# Patient Record
Sex: Female | Born: 1990 | Race: Black or African American | Hispanic: No | Marital: Single
Health system: Southern US, Academic
[De-identification: ages and names within clinical notes are randomized; demographics above are authoritative.]

## PROBLEM LIST (undated history)

## (undated) ENCOUNTER — Telehealth

## (undated) ENCOUNTER — Ambulatory Visit

## (undated) ENCOUNTER — Encounter

## (undated) ENCOUNTER — Ambulatory Visit: Payer: BLUE CROSS/BLUE SHIELD

## (undated) ENCOUNTER — Ambulatory Visit: Attending: Obstetrics & Gynecology | Primary: Obstetrics & Gynecology

## (undated) ENCOUNTER — Encounter
Attending: Student in an Organized Health Care Education/Training Program | Primary: Student in an Organized Health Care Education/Training Program

## (undated) ENCOUNTER — Ambulatory Visit
Payer: Medicaid (Managed Care) | Attending: Student in an Organized Health Care Education/Training Program | Primary: Student in an Organized Health Care Education/Training Program

## (undated) ENCOUNTER — Inpatient Hospital Stay (HOSPITAL_COMMUNITY): Payer: Self-pay

## (undated) DIAGNOSIS — D573 Sickle-cell trait: Secondary | ICD-10-CM

## (undated) DIAGNOSIS — N3010 Interstitial cystitis (chronic) without hematuria: Secondary | ICD-10-CM

## (undated) DIAGNOSIS — M6289 Other specified disorders of muscle: Secondary | ICD-10-CM

## (undated) DIAGNOSIS — Z973 Presence of spectacles and contact lenses: Secondary | ICD-10-CM

## (undated) DIAGNOSIS — B20 Human immunodeficiency virus [HIV] disease: Secondary | ICD-10-CM

## (undated) DIAGNOSIS — B009 Herpesviral infection, unspecified: Secondary | ICD-10-CM

## (undated) DIAGNOSIS — D649 Anemia, unspecified: Secondary | ICD-10-CM

## (undated) DIAGNOSIS — I10 Essential (primary) hypertension: Secondary | ICD-10-CM

## (undated) DIAGNOSIS — R519 Headache, unspecified: Secondary | ICD-10-CM

## (undated) DIAGNOSIS — Z8619 Personal history of other infectious and parasitic diseases: Secondary | ICD-10-CM

## (undated) DIAGNOSIS — Z21 Asymptomatic human immunodeficiency virus [HIV] infection status: Secondary | ICD-10-CM

## (undated) HISTORY — DX: Anemia, unspecified: D64.9

## (undated) HISTORY — DX: Asymptomatic human immunodeficiency virus (hiv) infection status: Z21

## (undated) HISTORY — DX: Personal history of other infectious and parasitic diseases: Z86.19

## (undated) HISTORY — PX: DILATION AND CURETTAGE, DIAGNOSTIC / THERAPEUTIC: SUR384

## (undated) HISTORY — DX: Human immunodeficiency virus (HIV) disease: B20

---
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
## 2010-02-02 ENCOUNTER — Emergency Department (HOSPITAL_BASED_OUTPATIENT_CLINIC_OR_DEPARTMENT_OTHER): Admission: EM | Admit: 2010-02-02 | Discharge: 2010-02-02 | Payer: Self-pay | Admitting: Emergency Medicine

## 2010-04-15 ENCOUNTER — Emergency Department (HOSPITAL_COMMUNITY)
Admission: EM | Admit: 2010-04-15 | Discharge: 2010-04-15 | Payer: Self-pay | Source: Home / Self Care | Admitting: Emergency Medicine

## 2010-04-17 ENCOUNTER — Emergency Department (HOSPITAL_BASED_OUTPATIENT_CLINIC_OR_DEPARTMENT_OTHER)
Admission: EM | Admit: 2010-04-17 | Discharge: 2010-04-17 | Payer: Self-pay | Source: Home / Self Care | Admitting: Emergency Medicine

## 2010-04-21 LAB — PREGNANCY, URINE: Preg Test, Ur: POSITIVE

## 2010-04-21 LAB — URINALYSIS, ROUTINE W REFLEX MICROSCOPIC
Bilirubin Urine: NEGATIVE
Hgb urine dipstick: NEGATIVE
Ketones, ur: NEGATIVE mg/dL
Nitrite: NEGATIVE
Protein, ur: NEGATIVE mg/dL
Specific Gravity, Urine: 1.014 (ref 1.005–1.030)
Urine Glucose, Fasting: NEGATIVE mg/dL
Urobilinogen, UA: 0.2 mg/dL (ref 0.0–1.0)
pH: 5.5 (ref 5.0–8.0)

## 2010-04-21 LAB — URINE CULTURE: Colony Count: 7000

## 2010-04-21 LAB — URINE MICROSCOPIC-ADD ON

## 2010-06-23 ENCOUNTER — Emergency Department (HOSPITAL_BASED_OUTPATIENT_CLINIC_OR_DEPARTMENT_OTHER)
Admission: EM | Admit: 2010-06-23 | Discharge: 2010-06-24 | Disposition: A | Discharge status: Home / Self Care | Payer: Self-pay | Attending: Emergency Medicine | Admitting: Emergency Medicine

## 2010-06-23 DIAGNOSIS — Y939 Activity, unspecified: Secondary | ICD-10-CM | POA: Insufficient documentation

## 2010-06-23 DIAGNOSIS — IMO0002 Reserved for concepts with insufficient information to code with codable children: Secondary | ICD-10-CM | POA: Insufficient documentation

## 2010-06-23 DIAGNOSIS — X500XXA Overexertion from strenuous movement or load, initial encounter: Secondary | ICD-10-CM | POA: Insufficient documentation

## 2010-06-24 ENCOUNTER — Emergency Department (INDEPENDENT_AMBULATORY_CARE_PROVIDER_SITE_OTHER): Payer: Self-pay

## 2010-06-24 DIAGNOSIS — M25569 Pain in unspecified knee: Secondary | ICD-10-CM

## 2010-06-24 DIAGNOSIS — X500XXA Overexertion from strenuous movement or load, initial encounter: Secondary | ICD-10-CM

## 2010-06-24 DIAGNOSIS — Y9345 Activity, cheerleading: Secondary | ICD-10-CM

## 2010-06-24 DIAGNOSIS — M25469 Effusion, unspecified knee: Secondary | ICD-10-CM

## 2010-08-07 ENCOUNTER — Ambulatory Visit: Payer: Self-pay | Attending: Orthopedic Surgery | Admitting: Physical Therapy

## 2010-08-07 DIAGNOSIS — IMO0001 Reserved for inherently not codable concepts without codable children: Secondary | ICD-10-CM | POA: Insufficient documentation

## 2010-08-07 DIAGNOSIS — M25569 Pain in unspecified knee: Secondary | ICD-10-CM | POA: Insufficient documentation

## 2010-12-18 ENCOUNTER — Emergency Department (HOSPITAL_BASED_OUTPATIENT_CLINIC_OR_DEPARTMENT_OTHER)
Admission: EM | Admit: 2010-12-18 | Discharge: 2010-12-19 | Disposition: A | Discharge status: Home / Self Care | Payer: Self-pay | Attending: Emergency Medicine | Admitting: Emergency Medicine

## 2010-12-18 ENCOUNTER — Encounter: Payer: Self-pay | Admitting: *Deleted

## 2010-12-18 DIAGNOSIS — B3731 Acute candidiasis of vulva and vagina: Secondary | ICD-10-CM | POA: Insufficient documentation

## 2010-12-18 DIAGNOSIS — R300 Dysuria: Secondary | ICD-10-CM | POA: Insufficient documentation

## 2010-12-18 DIAGNOSIS — B373 Candidiasis of vulva and vagina: Secondary | ICD-10-CM

## 2010-12-18 LAB — URINE MICROSCOPIC-ADD ON

## 2010-12-18 LAB — URINALYSIS, ROUTINE W REFLEX MICROSCOPIC
Hgb urine dipstick: NEGATIVE
Nitrite: NEGATIVE
Protein, ur: NEGATIVE mg/dL
Specific Gravity, Urine: 1.022 (ref 1.005–1.030)
Urobilinogen, UA: 1 mg/dL (ref 0.0–1.0)

## 2010-12-18 LAB — PREGNANCY, URINE: Preg Test, Ur: NEGATIVE

## 2010-12-18 MED ORDER — CIPROFLOXACIN HCL 500 MG PO TABS
500.0000 mg | ORAL_TABLET | Freq: Once | ORAL | Status: AC
  Administered 2010-12-19: 500 mg via ORAL
  Filled 2010-12-18: qty 1

## 2010-12-18 MED ORDER — FLUCONAZOLE 50 MG PO TABS
150.0000 mg | ORAL_TABLET | Freq: Once | ORAL | Status: AC
  Administered 2010-12-19: 150 mg via ORAL
  Filled 2010-12-18: qty 1

## 2010-12-18 NOTE — ED Provider Notes (Signed)
History     CSN: [id number] Arrival date & time: 12/18/2010 10:26 PM  Chief Complaint  Patient presents with  . Dysuria    HPI  (Consider location/radiation/quality/duration/timing/severity/associated sxs/prior treatment)  HPI Pt presents with c/o vulvar/vaginal irritation and itching. Pt also states she has burning with urination that has worsened since she scratched the area.  Tried monistat x 1 dose 2 days ago but was not able to complete course due to discomfort.  No abdomial pain, no vaginal discharge, no vomiting or fever.  PT denies hx of recent antibiotic use, no hx of STDS.   History reviewed. No pertinent past medical history.  History reviewed. No pertinent past surgical history.  No family history on file.  History  Substance Use Topics  . Smoking status: Never Smoker   . Smokeless tobacco: Not on file  . Alcohol Use: Yes    OB History    Grav Para Term Preterm Abortions TAB SAB Ect Mult Living                  Review of Systems  Review of Systems ROS reviewed and otherwise negative except for mentioned in HPI Allergies  Review of patient's allergies indicates no known allergies.  Home Medications  No current outpatient prescriptions on file.  Physical Exam    BP 136/95  Pulse 79  Temp(Src) 97.9 F (36.6 C) (Oral)  Resp 16  Ht 5\' 4"  (1.626 m)  Wt 145 lb (65.772 kg)  BMI 24.89 kg/m2  SpO2 100%  LMP 12/04/2010 Vitals reviewed Physical Exam Physical Examination: General appearance - alert, well appearing, and in no distress Mouth - mucous membranes moist, pharynx normal without lesions Chest - clear to auscultation, no wheezes, rales or rhonchi, symmetric air entry Heart - normal rate, regular rhythm, normal S1, S2, no murmurs, rubs, clicks or gallops Abdomen - soft, nontender, nondistended, no masses or organomegaly Pelvic -external exam reveals vulvar erythema and areas of excoriation, pt refused further pelvic examination or testing Skin -  normal coloration and turgor, no rashes, no suspicious skin lesions other than vulvar findings noted above  ED Course  Procedures (including critical care time)  Labs Reviewed  URINALYSIS, ROUTINE W REFLEX MICROSCOPIC - Abnormal; Notable for the following:    Appearance CLOUDY (*)    Leukocytes, UA MODERATE (*)    All other components within normal limits  URINE MICROSCOPIC-ADD ON - Abnormal; Notable for the following:    Squamous Epithelial / LPF FEW (*)    All other components within normal limits  PREGNANCY, URINE   No results found.   No diagnosis found.   MDM Pt with external vaginal irritation and itching.  Pt declines pelvic exam, after long discussion with her about reasons for pelvic, and testing for STDs.  External exam reveals vulvar inflammation and redness with some excoriations, no vesicles.  UA shows mod WBCs, will send culture.  Will treat with diflucan for presumptive candidal vaginitis and also for UTI. Pt states she has appointment with PMD in 1 week. Given strict return precautions.  Pt agreeable with plan.         Ethelda Chick, MD 12/19/10 [phone]

## 2010-12-18 NOTE — ED Notes (Signed)
Pt c/o burning and itching on the outside of her genitalia that is worse when urinating.

## 2010-12-19 MED ORDER — CIPROFLOXACIN HCL 500 MG PO TABS: 500.0000 mg | ORAL_TABLET | Freq: Two times a day (BID) | ORAL | Status: AC

## 2011-03-31 DIAGNOSIS — Z21 Asymptomatic human immunodeficiency virus [HIV] infection status: Secondary | ICD-10-CM | POA: Insufficient documentation

## 2011-03-31 NOTE — L&D Delivery Note (Signed)
Delivery Note At 2:09 PM Weeks viable female was delivered via Vaginal, Spontaneous Delivery (Presentation: Left Occiput Anterior).  APGAR: 7, 9.   Placenta status: Intact, Spontaneous.  Cord:  with the following complications: none  Anesthesia: Epidural  Episiotomy: none Lacerations: 2nd degree;Perineal Suture Repair: 2.0 vicryl rapide Est. Blood Loss (mL): 300 ml  Mom to postpartum.  Baby to nursery-stable.  Tonya Weeks,Tonya Weeks 03/09/2012, 2:34 PM

## 2011-08-13 LAB — OB RESULTS CONSOLE ANTIBODY SCREEN: Antibody Screen: NEGATIVE

## 2011-08-13 LAB — OB RESULTS CONSOLE RUBELLA ANTIBODY, IGM: Rubella: IMMUNE

## 2011-08-31 NOTE — Progress Notes (Unsigned)
Referral from [facility] for treatment of newly diagnosed HIV Pt was contacted on Aug 17, 2011 and given intake appointment on 09-01-11 . I will check with ID physician to see if she will need office visit with physician prior to intake to start HAART.   Laurell Josephs, RN , BSN

## 2011-09-01 ENCOUNTER — Ambulatory Visit: Payer: Self-pay

## 2011-09-01 ENCOUNTER — Encounter: Payer: Self-pay | Admitting: Internal Medicine

## 2011-09-01 ENCOUNTER — Ambulatory Visit (INDEPENDENT_AMBULATORY_CARE_PROVIDER_SITE_OTHER): Payer: Self-pay | Admitting: Internal Medicine

## 2011-09-01 ENCOUNTER — Other Ambulatory Visit: Payer: Self-pay | Admitting: Internal Medicine

## 2011-09-01 ENCOUNTER — Ambulatory Visit: Payer: Self-pay | Admitting: Internal Medicine

## 2011-09-01 VITALS — BP 149/97 | HR 129 | Temp 98.1°F | Ht 63.0 in | Wt 153.0 lb

## 2011-09-01 DIAGNOSIS — B20 Human immunodeficiency virus [HIV] disease: Secondary | ICD-10-CM | POA: Insufficient documentation

## 2011-09-01 DIAGNOSIS — Z23 Encounter for immunization: Secondary | ICD-10-CM

## 2011-09-01 LAB — COMPLETE METABOLIC PANEL WITH GFR
Albumin: 4.2 g/dL (ref 3.5–5.2)
BUN: 5 mg/dL — ABNORMAL LOW (ref 6–23)
CO2: 22 mEq/L (ref 19–32)
Calcium: 9.6 mg/dL (ref 8.4–10.5)
Chloride: 102 mEq/L (ref 96–112)
GFR, Est African American: >89 mL/min
GFR, Est Non African American: >89 mL/min
Glucose, Bld: 100 mg/dL — ABNORMAL HIGH (ref 70–99)
Potassium: 3.7 mEq/L (ref 3.5–5.3)
Sodium: 134 mEq/L — ABNORMAL LOW (ref 135–145)
Total Protein: 7.8 g/dL (ref 6.0–8.3)

## 2011-09-01 LAB — CBC WITH DIFFERENTIAL/PLATELET
HCT: 31.5 % — ABNORMAL LOW (ref 36.0–46.0)
Hemoglobin: 10.4 g/dL — ABNORMAL LOW (ref 12.0–15.0)
Lymphs Abs: 2.1 10*3/uL (ref 0.7–4.0)
MCHC: 33 g/dL (ref 30.0–36.0)
Monocytes Absolute: 0.5 10*3/uL (ref 0.1–1.0)
Monocytes Relative: 9 % (ref 3–12)
Neutro Abs: 3.5 10*3/uL (ref 1.7–7.7)
Neutrophils Relative %: 56 % (ref 43–77)
RBC: 4.72 MIL/uL (ref 3.87–5.11)

## 2011-09-01 LAB — HEPATITIS B SURFACE ANTIGEN: Hepatitis B Surface Ag: NEGATIVE

## 2011-09-01 MED ORDER — EMTRICITABINE-TENOFOVIR DF 200-300 MG PO TABS: 1.0000 | ORAL_TABLET | Freq: Every day | ORAL | Status: DC

## 2011-09-01 MED ORDER — ATAZANAVIR SULFATE 300 MG PO CAPS: 300.0000 mg | ORAL_CAPSULE | Freq: Every day | ORAL | Status: DC

## 2011-09-01 MED ORDER — RITONAVIR 100 MG PO TABS: 100.0000 mg | ORAL_TABLET | Freq: Every day | ORAL | Status: DC

## 2011-09-01 NOTE — Patient Instructions (Signed)
Blood test in 1 month

## 2011-09-01 NOTE — Progress Notes (Signed)
  Subjective:    Patient ID: Tonya Weeks, female    DOB: 1991-03-28, 21 y.o.   MRN: [id number]  HPI herre as a new patient.  Seen by OB for pregnancy and found out to be HIV +.  Was shocked and does not remember being tested previously.  Does not know when or how.  Denies drug use.  Thinks she is in the first trimester of pregnancy.  Viral load 41,000 at outside lab.  CD4 has not been done.  Hep C negative.  Toxo negative.  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  No weight loss or other symptoms.  Here with her mom who is aware of the diagnosis.  Feels she has good social support with her mom.     Review of Systems  Constitutional: Negative for fever, chills, fatigue and unexpected weight change.  HENT: Negative for sore throat and trouble swallowing.   Respiratory: Negative for cough, chest tightness and shortness of breath.   Cardiovascular: Negative for chest pain, palpitations and leg swelling.  Gastrointestinal: Negative for nausea, abdominal pain, diarrhea and constipation.  Musculoskeletal: Negative for myalgias, joint swelling and arthralgias.  Skin: Negative for rash.  Neurological: Negative for dizziness and headaches.  Hematological: Negative for adenopathy.  Psychiatric/Behavioral: Negative for dysphoric mood. The patient is not nervous/anxious.        Objective:   Physical Exam  Constitutional: She appears well-developed and well-nourished. No distress.  HENT:  Mouth/Throat: Oropharynx is clear and moist. No oropharyngeal exudate.  Cardiovascular: Normal rate, regular rhythm and normal heart sounds.  Exam reveals no gallop and no friction rub.   No murmur heard. Pulmonary/Chest: Effort normal and breath sounds normal. No respiratory distress. She has no wheezes. She has no rales.  Abdominal: Soft. Bowel sounds are normal. She exhibits no distension. There is no tenderness. There is no rebound.  Lymphadenopathy:    She has no cervical adenopathy.  Skin: Skin is warm and dry.  No erythema.  Psychiatric:       Tearful initially but accepting          Assessment & Plan:

## 2011-09-01 NOTE — Assessment & Plan Note (Addendum)
I discussed with her at length the good prognosis with treatment and the different treatment options with pregnancy.  I will start her on boosted Reyataz with Truvada.  She will RTC in 1 month for labs.  She will get genotype today and confirmation along with hepatitis labs.  Pneumovax today.

## 2011-09-02 LAB — T-HELPER CELL (CD4) - (RCID CLINIC ONLY): CD4 % Helper T Cell: 15 % — ABNORMAL LOW (ref 33–55)

## 2011-09-02 LAB — HEPATITIS A ANTIBODY, TOTAL: Hep A Total Ab: POSITIVE — AB

## 2011-09-03 LAB — HIV-1 RNA ULTRAQUANT REFLEX TO GENTYP+
HIV 1 RNA Quant: 54283 copies/mL — ABNORMAL HIGH (ref <20)
HIV-1 RNA Quant, Log: 4.73 {Log} — ABNORMAL HIGH (ref <1.30)

## 2011-09-07 ENCOUNTER — Telehealth: Payer: Self-pay | Admitting: *Deleted

## 2011-09-07 NOTE — Telephone Encounter (Signed)
Patient called and reported that she is vomiting daily, not when she takes the medication but with the next meal. She reports that she is taking the meds with a meal and that this is new. She has only been taking the medication for about 7 days. She is not sure if it is the medication or if it is her pregnancy. She also reports no pain or diarrhea. Advised patient I will have to message the provider and give her a call back with his advise.

## 2011-09-08 ENCOUNTER — Other Ambulatory Visit: Payer: Self-pay | Admitting: *Deleted

## 2011-09-08 DIAGNOSIS — R112 Nausea with vomiting, unspecified: Secondary | ICD-10-CM

## 2011-09-08 MED ORDER — PROMETHAZINE HCL 12.5 MG PO TABS: 12.5000 mg | ORAL_TABLET | Freq: Four times a day (QID) | ORAL | Status: DC | PRN

## 2011-09-08 NOTE — Telephone Encounter (Signed)
I suspect more related to pregnancy since it is her first trimester.  She can try 12.5 mg of phenergan every 4 hours PRN.

## 2011-09-08 NOTE — Telephone Encounter (Signed)
Called patient back and advised of the provisders advise and she wants to try the promethazine. Sent the Rx to the pharmacy.

## 2011-09-10 LAB — HIV-1 GENOTYPR PLUS

## 2011-09-17 ENCOUNTER — Encounter (HOSPITAL_COMMUNITY): Payer: Self-pay | Admitting: Emergency Medicine

## 2011-09-17 ENCOUNTER — Emergency Department (HOSPITAL_COMMUNITY)
Admission: EM | Admit: 2011-09-17 | Discharge: 2011-09-18 | Disposition: A | Discharge status: Home / Self Care | Payer: Medicaid Other | Attending: Emergency Medicine | Admitting: Emergency Medicine

## 2011-09-17 DIAGNOSIS — R51 Headache: Secondary | ICD-10-CM | POA: Insufficient documentation

## 2011-09-17 DIAGNOSIS — Z21 Asymptomatic human immunodeficiency virus [HIV] infection status: Secondary | ICD-10-CM | POA: Insufficient documentation

## 2011-09-17 DIAGNOSIS — O210 Mild hyperemesis gravidarum: Secondary | ICD-10-CM | POA: Insufficient documentation

## 2011-09-17 DIAGNOSIS — Z79899 Other long term (current) drug therapy: Secondary | ICD-10-CM | POA: Insufficient documentation

## 2011-09-17 DIAGNOSIS — R1110 Vomiting, unspecified: Secondary | ICD-10-CM

## 2011-09-17 HISTORY — DX: Human immunodeficiency virus (HIV) disease: B20

## 2011-09-17 LAB — CBC
HCT: 33.4 % — ABNORMAL LOW (ref 36.0–46.0)
MCHC: 33.2 g/dL (ref 30.0–36.0)
Platelets: 296 10*3/uL (ref 150–400)
RDW: 20.6 % — ABNORMAL HIGH (ref 11.5–15.5)

## 2011-09-17 LAB — POCT I-STAT, CHEM 8
Chloride: 104 mEq/L (ref 96–112)
Creatinine, Ser: 0.6 mg/dL (ref 0.50–1.10)
Glucose, Bld: 74 mg/dL (ref 70–99)
HCT: 36 % (ref 36.0–46.0)
Potassium: 3.9 mEq/L (ref 3.5–5.1)

## 2011-09-17 LAB — URINALYSIS, ROUTINE W REFLEX MICROSCOPIC
Glucose, UA: NEGATIVE mg/dL
Protein, ur: NEGATIVE mg/dL
Urobilinogen, UA: 0.2 mg/dL (ref 0.0–1.0)

## 2011-09-17 LAB — DIFFERENTIAL
Basophils Absolute: 0 10*3/uL (ref 0.0–0.1)
Eosinophils Absolute: 0.1 10*3/uL (ref 0.0–0.7)
Lymphocytes Relative: 29 % (ref 12–46)
Lymphs Abs: 2.3 10*3/uL (ref 0.7–4.0)
Neutro Abs: 5.2 10*3/uL (ref 1.7–7.7)

## 2011-09-17 LAB — URINE MICROSCOPIC-ADD ON

## 2011-09-17 MED ORDER — SODIUM CHLORIDE 0.9 % IV BOLUS (SEPSIS)
1000.0000 mL | Freq: Once | INTRAVENOUS | Status: AC
  Administered 2011-09-17: 1000 mL via INTRAVENOUS

## 2011-09-17 MED ORDER — ONDANSETRON HCL 4 MG/2ML IJ SOLN
4.0000 mg | Freq: Once | INTRAMUSCULAR | Status: AC
  Administered 2011-09-17: 4 mg via INTRAVENOUS
  Filled 2011-09-17: qty 2

## 2011-09-17 NOTE — ED Notes (Signed)
Pt states she has been vomiting for a day and a half  Pt states she has nausea, headache, and has been feeling dizzy  Pt states she has been taking phenergan po for the nausea without relief

## 2011-09-17 NOTE — ED Provider Notes (Signed)
History     CSN: [id number]  Arrival date & time 09/17/11  1916   First MD Initiated Contact with Patient 09/17/11 2146      Chief Complaint  Patient presents with  . Nausea  . Emesis  . Headache    (Consider location/radiation/quality/duration/timing/severity/associated sxs/prior treatment) HPI Comments: Patient is [redacted] weeks pregnant.  She has had nausea and vomiting with loose stools for the past day and half.  She is taking Phenergan without relief from her symptoms.  She reports just before she vomits.  She has a feeling of discomfort in her upper abdomen.  Denies vaginal bleeding discharge, UTI symptoms.  She thinks she may feel this slight fluttering in her abdomen but as this is her first pregnancy is unsure if this is fetal movement.  She is followed [place] by her ID doctor, as well as her OB/GYN.  She, states she has not missed any of her HIV medicines  Patient is a 21 y.o. female presenting with vomiting and headaches. The history is provided by the patient.  Emesis  This is a new problem. The current episode started yesterday. The problem occurs 5 to 10 times per day. The problem has not changed since onset.The emesis has an appearance of bilious material. There has been no fever. Associated symptoms include abdominal pain, diarrhea and headaches. Pertinent negatives include no chills, no cough, no fever, no myalgias and no URI.  Headache  Associated symptoms include nausea and vomiting. Pertinent negatives include no fever.    Past Medical History  Diagnosis Date  . HIV infection   . HIV (human immunodeficiency virus infection)     History reviewed. No pertinent past surgical history.  Family History  Problem Relation Age of Onset  . Lupus Mother   . Hypertension Other   . Diabetes Other   . Stroke Other   . Coronary artery disease Other     History  Substance Use Topics  . Smoking status: Never Smoker   . Smokeless tobacco: Never Used  . Alcohol Use: No     OB History    Grav Para Term Preterm Abortions TAB SAB Ect Mult Living   2    1           Review of Systems  Constitutional: Negative for fever and chills.  HENT: Negative for rhinorrhea.   Respiratory: Negative for cough.   Gastrointestinal: Positive for nausea, vomiting, abdominal pain and diarrhea.  Genitourinary: Negative for dysuria, frequency, vaginal bleeding, vaginal discharge and vaginal pain.  Musculoskeletal: Negative for myalgias.  Neurological: Positive for headaches. Negative for dizziness and weakness.    Allergies  Review of patient's allergies indicates no known allergies.  Home Medications   Current Outpatient Rx  Name Route Sig Dispense Refill  . ATAZANAVIR SULFATE 300 MG PO CAPS Oral Take 1 capsule (300 mg total) by mouth daily with breakfast. 30 capsule 11  . EMTRICITABINE-TENOFOVIR 200-300 MG PO TABS Oral Take 1 tablet by mouth daily. 30 tablet 11  . HYDROCORTISONE 1 % EX CREA Topical Apply 1 application topically as needed. For itch    . PRENATAL MULTIVITAMIN CH Oral Take 1 tablet by mouth daily.    Marland Kitchen RITONAVIR 100 MG PO TABS Oral Take 1 tablet (100 mg total) by mouth daily with breakfast. 30 tablet 11  . ONDANSETRON HCL 4 MG PO TABS Oral Take 1 tablet (4 mg total) by mouth every 6 (six) hours. 12 tablet 0    BP 107/62  Pulse  91  Temp 98.2 F (36.8 C) (Oral)  Resp 20  SpO2 100%  LMP 06/08/2011  Physical Exam  Constitutional: She is oriented to person, place, and time. She appears well-developed and well-nourished.  HENT:  Head: Normocephalic.  Mouth/Throat: Uvula is midline.  Cardiovascular: Normal rate.   Pulmonary/Chest: Effort normal.  Abdominal: She exhibits no distension. There is no tenderness.  Musculoskeletal: She exhibits no edema and no tenderness.  Neurological: She is alert and oriented to person, place, and time.  Skin: Skin is warm. No rash noted.    ED Course  Procedures (including critical care time)  Labs Reviewed   URINALYSIS, ROUTINE W REFLEX MICROSCOPIC - Abnormal; Notable for the following:    APPearance CLOUDY (*)     Ketones, ur TRACE (*)     Leukocytes, UA SMALL (*)     All other components within normal limits  CBC - Abnormal; Notable for the following:    Hemoglobin 11.1 (*)     HCT 33.4 (*)     MCV 69.0 (*)     MCH 22.9 (*)     RDW 20.6 (*)     All other components within normal limits  POCT I-STAT, CHEM 8 - Abnormal; Notable for the following:    BUN <3 (*)     All other components within normal limits  URINE MICROSCOPIC-ADD ON - Abnormal; Notable for the following:    Squamous Epithelial / LPF FEW (*)     Bacteria, UA MANY (*)     Casts HYALINE CASTS (*)  GRANULAR CAST   All other components within normal limits  DIFFERENTIAL   No results found.   1. Vomiting     Tolerating by mouth fluids, when necessary greater than 160.  Headache is resolved with by mouth Tylenol  MDM   Will hydrate, obtain cbc I-stat and urine        Arman Filter, NP 09/18/11 1610  Arman Filter, NP 09/18/11 [phone]

## 2011-09-18 MED ORDER — ONDANSETRON HCL 4 MG PO TABS: 4.0000 mg | ORAL_TABLET | Freq: Four times a day (QID) | ORAL | Status: AC

## 2011-09-18 MED ORDER — ACETAMINOPHEN 325 MG PO TABS
650.0000 mg | ORAL_TABLET | Freq: Once | ORAL | Status: AC
  Administered 2011-09-18: 650 mg via ORAL
  Filled 2011-09-18: qty 2

## 2011-09-18 NOTE — ED Notes (Signed)
Tolerating fluids, ready for discharge 

## 2011-09-19 NOTE — ED Provider Notes (Signed)
Medical screening examination/treatment/procedure(s) were performed by non-physician practitioner and as supervising physician I was immediately available for consultation/collaboration.  Flint Melter, MD 09/19/11 [phone]

## 2011-10-06 ENCOUNTER — Other Ambulatory Visit: Payer: Medicaid Other

## 2011-10-06 DIAGNOSIS — B20 Human immunodeficiency virus [HIV] disease: Secondary | ICD-10-CM

## 2011-10-06 LAB — COMPLETE METABOLIC PANEL WITH GFR
Alkaline Phosphatase: 72 U/L (ref 39–117)
BUN: 6 mg/dL (ref 6–23)
Creat: 0.48 mg/dL — ABNORMAL LOW (ref 0.50–1.10)
GFR, Est Non African American: >89 mL/min
Glucose, Bld: 110 mg/dL — ABNORMAL HIGH (ref 70–99)
Sodium: 131 mEq/L — ABNORMAL LOW (ref 135–145)
Total Bilirubin: 1.1 mg/dL (ref 0.3–1.2)
Total Protein: 7.2 g/dL (ref 6.0–8.3)

## 2011-10-06 LAB — CBC WITH DIFFERENTIAL/PLATELET
Basophils Relative: 0 % (ref 0–1)
Eosinophils Absolute: 0 10*3/uL (ref 0.0–0.7)
Eosinophils Relative: 0 % (ref 0–5)
Hemoglobin: 9.5 g/dL — ABNORMAL LOW (ref 12.0–15.0)
MCH: 23.2 pg — ABNORMAL LOW (ref 26.0–34.0)
MCHC: 33.6 g/dL (ref 30.0–36.0)
Monocytes Relative: 7 % (ref 3–12)
Neutrophils Relative %: 69 % (ref 43–77)

## 2011-10-07 LAB — T-HELPER CELL (CD4) - (RCID CLINIC ONLY)
CD4 % Helper T Cell: 20 % — ABNORMAL LOW (ref 33–55)
CD4 T Cell Abs: 290 uL — ABNORMAL LOW (ref 400–2700)

## 2011-10-08 LAB — HIV-1 RNA QUANT-NO REFLEX-BLD: HIV 1 RNA Quant: 210 copies/mL — ABNORMAL HIGH (ref <20)

## 2011-10-20 ENCOUNTER — Ambulatory Visit: Payer: Self-pay | Admitting: Internal Medicine

## 2011-11-03 ENCOUNTER — Encounter: Payer: Self-pay | Admitting: Internal Medicine

## 2011-11-03 ENCOUNTER — Ambulatory Visit (INDEPENDENT_AMBULATORY_CARE_PROVIDER_SITE_OTHER): Payer: Medicaid Other | Admitting: Internal Medicine

## 2011-11-03 VITALS — BP 131/88 | HR 108 | Temp 98.2°F | Ht 63.0 in | Wt 160.0 lb

## 2011-11-03 DIAGNOSIS — B20 Human immunodeficiency virus [HIV] disease: Secondary | ICD-10-CM

## 2011-11-03 NOTE — Assessment & Plan Note (Signed)
She is having an appropriate response with her viral load. I will check her viral load again today since it has been one month. She will return in 2 months with labs 2 weeks before. That will put her at about 7-7-1/2 months of pregnancy. All questions were again answered. The patient is continuing to adjust and I did discuss that she needs to continue taking her medications even when pregnant and during delivery and that will be provided at the hospital.

## 2011-11-03 NOTE — Progress Notes (Signed)
  Subjective:    Patient ID: Tonya Weeks, female    DOB: Feb 21, 1991, 21 y.o.   MRN: [id number]  HPI She comes in here for her second visit. She was seen in June of this year and found to be HIV positive during her prenatal visit. She has an initial viral load of 41,000 and her C4 over 200. She did visit with her mom at that appointment. She was then started on Norvir, Reyataz and Truvada. She reports excellent compliance which is evidenced by her viral load which is down to 210. She did have some initial nausea and vomiting that has since resolved. She is tolerating the medicine well and is pleased with the lack of side effects.   Review of Systems  Constitutional: Negative for fever, chills, appetite change, fatigue and unexpected weight change.  HENT: Negative for sore throat and trouble swallowing.   Respiratory: Negative for cough, shortness of breath and wheezing.   Cardiovascular: Negative for chest pain, palpitations and leg swelling.  Gastrointestinal: Negative for nausea, abdominal pain, diarrhea and constipation.  Musculoskeletal: Negative for myalgias, joint swelling and arthralgias.  Skin: Negative for rash.  Neurological: Negative for dizziness and headaches.       Objective:   Physical Exam  Constitutional: She appears well-developed and well-nourished. No distress.  HENT:  Mouth/Throat: Oropharynx is clear and moist. No oropharyngeal exudate.  Cardiovascular: Normal rate, regular rhythm and normal heart sounds.  Exam reveals no gallop and no friction rub.   No murmur heard. Pulmonary/Chest: Effort normal and breath sounds normal. No respiratory distress. She has no wheezes.  Abdominal:       gravid  Lymphadenopathy:    She has no cervical adenopathy.          Assessment & Plan:

## 2011-11-04 LAB — HIV-1 RNA QUANT-NO REFLEX-BLD
HIV 1 RNA Quant: 120 copies/mL — ABNORMAL HIGH (ref <20)
HIV-1 RNA Quant, Log: 2.08 {Log} — ABNORMAL HIGH (ref <1.30)

## 2011-11-16 ENCOUNTER — Inpatient Hospital Stay (HOSPITAL_COMMUNITY)
Admission: AD | Admit: 2011-11-16 | Discharge: 2011-11-16 | Disposition: A | Discharge status: Home / Self Care | Payer: Medicaid Other | Source: Ambulatory Visit | Attending: Obstetrics | Admitting: Obstetrics

## 2011-11-16 ENCOUNTER — Other Ambulatory Visit: Payer: Self-pay

## 2011-11-16 ENCOUNTER — Encounter (HOSPITAL_COMMUNITY): Payer: Self-pay

## 2011-11-16 DIAGNOSIS — O99019 Anemia complicating pregnancy, unspecified trimester: Secondary | ICD-10-CM | POA: Insufficient documentation

## 2011-11-16 DIAGNOSIS — R0602 Shortness of breath: Secondary | ICD-10-CM

## 2011-11-16 DIAGNOSIS — O98519 Other viral diseases complicating pregnancy, unspecified trimester: Secondary | ICD-10-CM | POA: Insufficient documentation

## 2011-11-16 DIAGNOSIS — Z21 Asymptomatic human immunodeficiency virus [HIV] infection status: Secondary | ICD-10-CM | POA: Insufficient documentation

## 2011-11-16 DIAGNOSIS — R000 Tachycardia, unspecified: Secondary | ICD-10-CM | POA: Insufficient documentation

## 2011-11-16 DIAGNOSIS — D649 Anemia, unspecified: Secondary | ICD-10-CM

## 2011-11-16 HISTORY — DX: Sickle-cell trait: D57.3

## 2011-11-16 LAB — COMPREHENSIVE METABOLIC PANEL
Albumin: 3.2 g/dL — ABNORMAL LOW (ref 3.5–5.2)
BUN: 5 mg/dL — ABNORMAL LOW (ref 6–23)
Chloride: 99 mEq/L (ref 96–112)
Creatinine, Ser: 0.51 mg/dL (ref 0.50–1.10)
GFR calc Af Amer: >90 mL/min (ref >90)
Glucose, Bld: 73 mg/dL (ref 70–99)
Total Bilirubin: 0.9 mg/dL (ref 0.3–1.2)
Total Protein: 7.5 g/dL (ref 6.0–8.3)

## 2011-11-16 LAB — CBC
HCT: 26.8 % — ABNORMAL LOW (ref 36.0–46.0)
MCH: 23.4 pg — ABNORMAL LOW (ref 26.0–34.0)
MCV: 72 fL — ABNORMAL LOW (ref 78.0–100.0)
Platelets: 241 10*3/uL (ref 150–400)
RDW: 16.3 % — ABNORMAL HIGH (ref 11.5–15.5)

## 2011-11-16 LAB — URINALYSIS, ROUTINE W REFLEX MICROSCOPIC
Bilirubin Urine: NEGATIVE
Hgb urine dipstick: NEGATIVE
Ketones, ur: NEGATIVE mg/dL
Nitrite: NEGATIVE
Urobilinogen, UA: 0.2 mg/dL (ref 0.0–1.0)

## 2011-11-16 MED ORDER — INTEGRA PLUS PO CAPS
1.0000 | ORAL_CAPSULE | ORAL | Status: DC
Start: 2011-11-16 — End: 2012-04-04

## 2011-11-16 MED ORDER — LACTATED RINGERS IV BOLUS (SEPSIS)
1000.0000 mL | Freq: Once | INTRAVENOUS | Status: AC
  Administered 2011-11-16: 1000 mL via INTRAVENOUS

## 2011-11-16 NOTE — MAU Note (Signed)
Pt in c/o shortness of breath x1 week, states it was worse today than it has been.  Was previously just sob with exertion, today happened without doing anything.  Denies any bleeding, discharge, pain.  + FM.

## 2011-11-16 NOTE — MAU Provider Note (Signed)
History     CSN: [id number]  Arrival date and time: 11/16/11 1714   First Provider Initiated Contact with Patient 11/16/11 1821      Chief Complaint  Patient presents with  . Shortness of Breath   HPI  Pt is [redacted]w[redacted]d pregnant pt of Dr. Jean Rosenthal Moore's who presents with SOB for about 1 week- it has worsened today without exertion.  Pt is on triple antiviral meds - diagnosed with HIV this pregnancy. Pt denies cough, wheezes, fever, chills, any UTI symtpoms.    Past Medical History  Diagnosis Date  . HIV infection   . HIV (human immunodeficiency virus infection)   . Sickle cell trait     Past Surgical History  Procedure Date  . Dilation and curettage, diagnostic / therapeutic     Family History  Problem Relation Age of Onset  . Lupus Mother   . Hypertension Other   . Diabetes Other   . Stroke Other   . Coronary artery disease Other   . Other Neg Hx     History  Substance Use Topics  . Smoking status: Never Smoker   . Smokeless tobacco: Never Used  . Alcohol Use: No    Allergies: No Known Allergies  Prescriptions prior to admission  Medication Sig Dispense Refill  . atazanavir (REYATAZ) 300 MG capsule Take 1 capsule (300 mg total) by mouth daily with breakfast.  30 capsule  11  . emtricitabine-tenofovir (TRUVADA) 200-300 MG per tablet Take 1 tablet by mouth daily.  30 tablet  11  . hydrocortisone cream 1 % Apply 1 application topically as needed. For itch      . Prenatal Vit-Fe Fumarate-FA (PRENATAL MULTIVITAMIN) TABS Take 1 tablet by mouth daily.      . ritonavir (NORVIR) 100 MG TABS Take 1 tablet (100 mg total) by mouth daily with breakfast.  30 tablet  11    Review of Systems  Constitutional: Negative for fever and chills.  Respiratory: Positive for shortness of breath. Negative for cough and wheezing.   Gastrointestinal: Negative for nausea, vomiting, abdominal pain, diarrhea and constipation.  Genitourinary: Negative for dysuria, urgency and frequency.    Musculoskeletal: Negative for myalgias.  Psychiatric/Behavioral: Negative for depression.   Physical Exam   Blood pressure 127/84, pulse 105, temperature 98 F (36.7 C), temperature source Oral, resp. rate 18, height 5' 2.5" (1.588 m), weight 74.118 kg (163 lb 6.4 oz), last menstrual period 06/08/2011, SpO2 100.00%.  Physical Exam  Vitals reviewed. Constitutional: She is oriented to person, place, and time. She appears well-developed and well-nourished. No distress.  HENT:  Head: Normocephalic.  Eyes: Pupils are equal, round, and reactive to light.  Neck: Normal range of motion.  Cardiovascular: Normal heart sounds.        tachycardia  Respiratory: Effort normal and breath sounds normal. No respiratory distress. She has no wheezes. She has no rales.  GI: Soft. She exhibits no distension. There is no tenderness.  Musculoskeletal: Normal range of motion.  Neurological: She is alert and oriented to person, place, and time.  Skin: Skin is warm and dry. She is not diaphoretic.  Psychiatric: She has a normal mood and affect.    MAU Course  Procedures Results for orders placed during the hospital encounter of 11/16/11 (from the past 24 hour(s))  URINALYSIS, ROUTINE W REFLEX MICROSCOPIC     Status: Abnormal   Collection Time   11/16/11  6:00 PM      Component Value Range   Color, Urine  YELLOW  YELLOW   APPearance HAZY (*) CLEAR   Specific Gravity, Urine 1.025  1.005 - 1.030   pH 6.0  5.0 - 8.0   Glucose, UA 500 (*) NEGATIVE mg/dL   Hgb urine dipstick NEGATIVE  NEGATIVE   Bilirubin Urine NEGATIVE  NEGATIVE   Ketones, ur NEGATIVE  NEGATIVE mg/dL   Protein, ur NEGATIVE  NEGATIVE mg/dL   Urobilinogen, UA 0.2  0.0 - 1.0 mg/dL   Nitrite NEGATIVE  NEGATIVE   Leukocytes, UA NEGATIVE  NEGATIVE  CBC     Status: Abnormal   Collection Time   11/16/11  6:20 PM      Component Value Range   WBC 10.0  4.0 - 10.5 K/uL   RBC 3.72 (*) 3.87 - 5.11 MIL/uL   Hemoglobin 8.7 (*) 12.0 - 15.0 g/dL    HCT 86.5 (*) 78.4 - 46.0 %   MCV 72.0 (*) 78.0 - 100.0 fL   MCH 23.4 (*) 26.0 - 34.0 pg   MCHC 32.5  30.0 - 36.0 g/dL   RDW 69.6 (*) 29.5 - 28.4 %   Platelets 241  150 - 400 K/uL  COMPREHENSIVE METABOLIC PANEL     Status: Abnormal   Collection Time   11/16/11  6:20 PM      Component Value Range   Sodium 134 (*) 135 - 145 mEq/L   Potassium 3.8  3.5 - 5.1 mEq/L   Chloride 99  96 - 112 mEq/L   CO2 23  19 - 32 mEq/L   Glucose, Bld 73  70 - 99 mg/dL   BUN 5 (*) 6 - 23 mg/dL   Creatinine, Ser 1.32  0.50 - 1.10 mg/dL   Calcium 9.6  8.4 - 44.0 mg/dL   Total Protein 7.5  6.0 - 8.3 g/dL   Albumin 3.2 (*) 3.5 - 5.2 g/dL   AST 17  0 - 37 U/L   ALT 11  0 - 35 U/L   Alkaline Phosphatase 102  39 - 117 U/L   Total Bilirubin 0.9  0.3 - 1.2 mg/dL   GFR calc non Af Amer >90  >90 mL/min   GFR calc Af Amer >90  >90 mL/min  .EKG performed with sinus tachycardia possible left atrial enlargement and septal infarct, age undetermined-  LR 1 liter given to pt with some improvement of symptoms  Discussed with Dr. Gaynell Face- pt to f/u with Dr. Tamela Oddi  Assessment and Plan  Anemia- prescription for Integra given Shortness of breath tachycardia HIV Cadince Hilscher 11/16/2011, 6:42 PM

## 2011-11-16 NOTE — MAU Note (Signed)
Patient states she has been having shortness with exertion for about one week. For the past few days she has been having it more often while not doing anything and just sitting. Today was worse. Report good fetal movement, no bleeding,leaking or contractions.

## 2011-11-23 LAB — OB RESULTS CONSOLE RPR: RPR: REACTIVE

## 2011-12-01 ENCOUNTER — Other Ambulatory Visit: Payer: Self-pay | Admitting: Obstetrics

## 2011-12-03 ENCOUNTER — Inpatient Hospital Stay (HOSPITAL_COMMUNITY)
Admission: AD | Admit: 2011-12-03 | Discharge: 2011-12-03 | Disposition: A | Discharge status: Home / Self Care | Payer: Self-pay | Source: Ambulatory Visit | Attending: Obstetrics | Admitting: Obstetrics

## 2011-12-03 ENCOUNTER — Inpatient Hospital Stay (HOSPITAL_COMMUNITY)
Admission: AD | Admit: 2011-12-03 | Discharge: 2011-12-04 | Disposition: A | Discharge status: Home / Self Care | Payer: Medicaid Other | Source: Ambulatory Visit | Attending: Obstetrics & Gynecology | Admitting: Obstetrics & Gynecology

## 2011-12-03 ENCOUNTER — Telehealth (HOSPITAL_COMMUNITY): Payer: Self-pay | Admitting: *Deleted

## 2011-12-03 DIAGNOSIS — O98319 Other infections with a predominantly sexual mode of transmission complicating pregnancy, unspecified trimester: Secondary | ICD-10-CM | POA: Insufficient documentation

## 2011-12-03 DIAGNOSIS — A64 Unspecified sexually transmitted disease: Secondary | ICD-10-CM | POA: Insufficient documentation

## 2011-12-03 MED ORDER — PENICILLIN G BENZATHINE 1200000 UNIT/2ML IM SUSP
2.4000 10*6.[IU] | Freq: Once | INTRAMUSCULAR | Status: AC
Start: 2011-12-03 — End: 2011-12-04
  Administered 2011-12-04: 2.4 10*6.[IU] via INTRAMUSCULAR
  Filled 2011-12-03: qty 4

## 2011-12-03 MED ORDER — PENICILLIN G BENZATHINE 1200000 UNIT/2ML IM SUSP
2.4000 10*6.[IU] | Freq: Once | INTRAMUSCULAR | Status: DC
  Filled 2011-12-03: qty 4

## 2011-12-03 NOTE — MAU Note (Signed)
Pt reports she was seen in MD"S office today, positive for ? STD. Was told to come here for antibiotic injection.

## 2011-12-03 NOTE — MAU Note (Signed)
Patient left before order and medication could be obtained.

## 2011-12-16 ENCOUNTER — Encounter (HOSPITAL_BASED_OUTPATIENT_CLINIC_OR_DEPARTMENT_OTHER): Payer: Self-pay | Admitting: *Deleted

## 2011-12-16 ENCOUNTER — Emergency Department (HOSPITAL_BASED_OUTPATIENT_CLINIC_OR_DEPARTMENT_OTHER)
Admission: EM | Admit: 2011-12-16 | Discharge: 2011-12-16 | Disposition: A | Discharge status: Home / Self Care | Payer: Medicaid Other | Attending: Emergency Medicine | Admitting: Emergency Medicine

## 2011-12-16 DIAGNOSIS — O99019 Anemia complicating pregnancy, unspecified trimester: Secondary | ICD-10-CM | POA: Insufficient documentation

## 2011-12-16 DIAGNOSIS — R0989 Other specified symptoms and signs involving the circulatory and respiratory systems: Secondary | ICD-10-CM | POA: Insufficient documentation

## 2011-12-16 DIAGNOSIS — R0609 Other forms of dyspnea: Secondary | ICD-10-CM | POA: Insufficient documentation

## 2011-12-16 DIAGNOSIS — D649 Anemia, unspecified: Secondary | ICD-10-CM | POA: Insufficient documentation

## 2011-12-16 DIAGNOSIS — R0600 Dyspnea, unspecified: Secondary | ICD-10-CM

## 2011-12-16 DIAGNOSIS — O99891 Other specified diseases and conditions complicating pregnancy: Secondary | ICD-10-CM | POA: Insufficient documentation

## 2011-12-16 LAB — COMPREHENSIVE METABOLIC PANEL
ALT: 10 U/L (ref 0–35)
AST: 18 U/L (ref 0–37)
CO2: 20 mEq/L (ref 19–32)
Chloride: 101 mEq/L (ref 96–112)
GFR calc Af Amer: >90 mL/min (ref >90)
GFR calc non Af Amer: >90 mL/min (ref >90)
Glucose, Bld: 101 mg/dL — ABNORMAL HIGH (ref 70–99)
Sodium: 134 mEq/L — ABNORMAL LOW (ref 135–145)
Total Bilirubin: 0.6 mg/dL (ref 0.3–1.2)

## 2011-12-16 LAB — CBC
Hemoglobin: 9.4 g/dL — ABNORMAL LOW (ref 12.0–15.0)
MCH: 24.2 pg — ABNORMAL LOW (ref 26.0–34.0)
MCV: 74.6 fL — ABNORMAL LOW (ref 78.0–100.0)
Platelets: 207 10*3/uL (ref 150–400)
RBC: 3.89 MIL/uL (ref 3.87–5.11)
WBC: 10 10*3/uL (ref 4.0–10.5)

## 2011-12-16 NOTE — ED Notes (Signed)
Here via ems pt states she was at work started having shortness of breath upon ems arrival pt was hyperventilating

## 2011-12-16 NOTE — ED Notes (Signed)
Fetal heart tones as requested by md, 148.

## 2011-12-16 NOTE — ED Provider Notes (Signed)
History     CSN: [id number]  Arrival date & time 12/16/11  1601   First MD Initiated Contact with Patient 12/16/11 1615      Chief Complaint  Patient presents with  . Shortness of Breath    (Consider location/radiation/quality/duration/timing/severity/associated sxs/prior treatment) HPI Complaint of shortness of breath gradual onset of posterior one month ago becoming worse, gradually this afternoon 2 PM. Patient states "the last time I was short of breath like this might iron was low" treated with oxygen by EMS, with relief Past Medical History  Diagnosis Date  . HIV infection   . HIV (human immunodeficiency virus infection)   . Sickle cell trait     Past Surgical History  Procedure Date  . Dilation and curettage, diagnostic / therapeutic     Family History  Problem Relation Age of Onset  . Lupus Mother   . Hypertension Other   . Diabetes Other   . Stroke Other   . Coronary artery disease Other   . Other Neg Hx     History  Substance Use Topics  . Smoking status: Never Smoker   . Smokeless tobacco: Never Used  . Alcohol Use: No    OB History    Grav Para Term Preterm Abortions TAB SAB Ect Mult Living   2    1           Review of Systems  Respiratory: Positive for shortness of breath.   Genitourinary:       Currently 7 months pregnant feels baby moving  All other systems reviewed and are negative.    Allergies  Review of patient's allergies indicates no known allergies.  Home Medications   Current Outpatient Rx  Name Route Sig Dispense Refill  . ACETAMINOPHEN 500 MG PO TABS Oral Take 1,000 mg by mouth every 4 (four) hours as needed. For pain    . ATAZANAVIR SULFATE 300 MG PO CAPS Oral Take 300 mg by mouth at bedtime.    Marland Kitchen EMTRICITABINE-TENOFOVIR 200-300 MG PO TABS Oral Take 1 tablet by mouth daily. 30 tablet 11  . INTEGRA PLUS PO CAPS Oral Take 1 capsule by mouth 1 day or 1 dose. 30 capsule 11  . HYDROCORTISONE 1 % EX CREA Topical Apply 1  application topically as needed. For itch    . ONDANSETRON HCL 4 MG PO TABS Oral Take 4 mg by mouth daily as needed. For nausea    . PRENATAL MULTIVITAMIN CH Oral Take 1 tablet by mouth daily.    Marland Kitchen RITONAVIR 100 MG PO TABS Oral Take 1 tablet (100 mg total) by mouth daily with breakfast. 30 tablet 11    BP 130/70  Pulse 102  Temp 98.2 F (36.8 C) (Oral)  Resp 20  LMP 06/08/2011  Physical Exam  Nursing note and vitals reviewed. Constitutional: She appears well-developed and well-nourished.  HENT:  Head: Normocephalic and atraumatic.  Eyes: Conjunctivae normal are normal. Pupils are equal, round, and reactive to light.  Neck: Neck supple. No tracheal deviation present. No thyromegaly present.  Cardiovascular: Normal rate and regular rhythm.   No murmur heard. Pulmonary/Chest: Effort normal and breath sounds normal.  Abdominal: Soft. Bowel sounds are normal. She exhibits no distension. There is no tenderness.       Gravid fetal heart tones 148  Musculoskeletal: Normal range of motion. She exhibits no edema and no tenderness.  Neurological: She is alert. Coordination normal.  Skin: Skin is warm and dry. No rash noted.  Psychiatric: She  has a normal mood and affect.    ED Course  Procedures (including critical care time) 5:30 PM patient states he is breathing normally, she is in no respiratory distress speaks in paragraphs    Labs Reviewed - No data to display No results found.   No diagnosis found.  Results for orders placed during the hospital encounter of 12/16/11  CBC      Component Value Range   WBC 10.0  4.0 - 10.5 K/uL   RBC 3.89  3.87 - 5.11 MIL/uL   Hemoglobin 9.4 (*) 12.0 - 15.0 g/dL   HCT 45.4 (*) 09.8 - 11.9 %   MCV 74.6 (*) 78.0 - 100.0 fL   MCH 24.2 (*) 26.0 - 34.0 pg   MCHC 32.4  30.0 - 36.0 g/dL   RDW 14.7 (*) 82.9 - 56.2 %   Platelets 207  150 - 400 K/uL  COMPREHENSIVE METABOLIC PANEL      Component Value Range   Sodium 134 (*) 135 - 145 mEq/L    Potassium 3.7  3.5 - 5.1 mEq/L   Chloride 101  96 - 112 mEq/L   CO2 20  19 - 32 mEq/L   Glucose, Bld 101 (*) 70 - 99 mg/dL   BUN <3 (*) 6 - 23 mg/dL   Creatinine, Ser 1.30  0.50 - 1.10 mg/dL   Calcium 9.5  8.4 - 86.5 mg/dL   Total Protein 7.5  6.0 - 8.3 g/dL   Albumin 3.2 (*) 3.5 - 5.2 g/dL   AST 18  0 - 37 U/L   ALT 10  0 - 35 U/L   Alkaline Phosphatase 109  39 - 117 U/L   Total Bilirubin 0.6  0.3 - 1.2 mg/dL   GFR calc non Af Amer >90  >90 mL/min   GFR calc Af Amer >90  >90 mL/min   No results found.  HGB is improved over 11/16/11 MDM  Case discussed with Dr. Tamela Oddi. Plan keep  appointment at office tomorrow.  Dx #1 dyspnea #2 anemia          Doug Sou, MD 12/16/11 [phone]

## 2011-12-24 ENCOUNTER — Other Ambulatory Visit: Payer: Self-pay | Admitting: Internal Medicine

## 2011-12-24 ENCOUNTER — Other Ambulatory Visit: Payer: Self-pay

## 2011-12-24 ENCOUNTER — Other Ambulatory Visit (INDEPENDENT_AMBULATORY_CARE_PROVIDER_SITE_OTHER): Payer: Medicaid Other

## 2011-12-24 ENCOUNTER — Other Ambulatory Visit: Payer: Self-pay | Admitting: Obstetrics & Gynecology

## 2011-12-24 DIAGNOSIS — Z113 Encounter for screening for infections with a predominantly sexual mode of transmission: Secondary | ICD-10-CM

## 2011-12-24 DIAGNOSIS — N899 Noninflammatory disorder of vagina, unspecified: Secondary | ICD-10-CM

## 2011-12-24 DIAGNOSIS — B20 Human immunodeficiency virus [HIV] disease: Secondary | ICD-10-CM

## 2011-12-24 LAB — CBC WITH DIFFERENTIAL/PLATELET
Basophils Absolute: 0 10*3/uL (ref 0.0–0.1)
Basophils Relative: 0 % (ref 0–1)
Eosinophils Absolute: 0.1 10*3/uL (ref 0.0–0.7)
MCH: 23.5 pg — ABNORMAL LOW (ref 26.0–34.0)
MCHC: 32.1 g/dL (ref 30.0–36.0)
Neutro Abs: 5.1 10*3/uL (ref 1.7–7.7)
Neutrophils Relative %: 67 % (ref 43–77)
Platelets: 253 10*3/uL (ref 150–400)
RDW: 21.1 % — ABNORMAL HIGH (ref 11.5–15.5)

## 2011-12-24 NOTE — Addendum Note (Signed)
Addended by: Emilio Aspen on: 12/24/2011 03:35 PM   Modules accepted: Orders

## 2011-12-25 ENCOUNTER — Telehealth: Payer: Self-pay | Admitting: *Deleted

## 2011-12-25 LAB — RPR: RPR Ser Ql: REACTIVE — AB

## 2011-12-25 LAB — T-HELPER CELL (CD4) - (RCID CLINIC ONLY)
CD4 % Helper T Cell: 22 % — ABNORMAL LOW (ref 33–55)
CD4 T Cell Abs: 440 uL (ref 400–2700)

## 2011-12-25 LAB — COMPLETE METABOLIC PANEL WITH GFR
AST: 15 U/L (ref 0–37)
Alkaline Phosphatase: 113 U/L (ref 39–117)
GFR, Est Non African American: >89 mL/min
Glucose, Bld: 70 mg/dL (ref 70–99)
Sodium: 135 mEq/L (ref 135–145)
Total Bilirubin: 1.3 mg/dL — ABNORMAL HIGH (ref 0.3–1.2)
Total Protein: 6.9 g/dL (ref 6.0–8.3)

## 2011-12-25 LAB — T.PALLIDUM AB, TOTAL: T pallidum Antibodies (TP-PA): >8 S/CO — ABNORMAL HIGH (ref <0.90)

## 2011-12-25 NOTE — Telephone Encounter (Signed)
Message copied by Macy Mis on Fri Dec 25, 2011 11:28 AM ------      Message from: Gardiner Barefoot      Created: Fri Dec 25, 2011 11:04 AM       This patient has a positive RPR and titer for syphilis and is pregnant.  She needs to get treated ASAP with 3 weekly penicillin injections. This can have consequences for the baby.  Please contact her to come in ASAP!  Thanks

## 2011-12-25 NOTE — Telephone Encounter (Signed)
Spoke with patient and she is aware, has received 2 rounds of penicillin at [facility] and is scheduled for the 3rd with Dr. Tamela Oddi her OB. Wendall Mola CMA

## 2011-12-29 ENCOUNTER — Telehealth: Payer: Self-pay | Admitting: *Deleted

## 2011-12-29 NOTE — Telephone Encounter (Signed)
Rep from [facility] called to see if we have treated this patient for her increased RPR titer. He advised that it went up from 1:8 prior to her treatment 12/03/11 to 1/68 on her on her RPR test run 12/24/11. Advised that the patient comes for a visit 01/07/12 and will send the doctor a note so that he is aware of the states concerns. He advised he will call back later this month as the patient is hard to talk to and hopefully Dr Luciana Axe can get through to her how serious this is.

## 2011-12-30 NOTE — Telephone Encounter (Signed)
According to [facility], she has already received two injections from her OB and they are handling it.  Thanks

## 2012-01-01 NOTE — Telephone Encounter (Signed)
According to the [facility] Department the patient is referring to her previous treatment not to the recent diagnosis on 12/24/11. She received shots 12/03/11 from OBGYN but her titer still went up. [facility] Department has tried to call and talk to her but they are not sure she gets that her numbers went up since she has those injections. They called back today and I advised them she is due here on 01-07-12 to see you and we will talk to her and try to get things straightened out then. I tried to call the patient today and got voice message but not able to leave a message.

## 2012-01-07 ENCOUNTER — Ambulatory Visit (INDEPENDENT_AMBULATORY_CARE_PROVIDER_SITE_OTHER): Payer: Medicaid Other | Admitting: Internal Medicine

## 2012-01-07 ENCOUNTER — Ambulatory Visit (HOSPITAL_COMMUNITY)
Admission: RE | Admit: 2012-01-07 | Discharge: 2012-01-07 | Disposition: A | Discharge status: Home / Self Care | Payer: Medicaid Other | Source: Ambulatory Visit | Attending: Obstetrics & Gynecology | Admitting: Obstetrics & Gynecology

## 2012-01-07 ENCOUNTER — Encounter: Payer: Self-pay | Admitting: Internal Medicine

## 2012-01-07 VITALS — BP 120/77 | HR 102 | Temp 97.6°F | Ht 64.0 in | Wt 173.0 lb

## 2012-01-07 DIAGNOSIS — Z202 Contact with and (suspected) exposure to infections with a predominantly sexual mode of transmission: Secondary | ICD-10-CM | POA: Insufficient documentation

## 2012-01-07 DIAGNOSIS — B20 Human immunodeficiency virus [HIV] disease: Secondary | ICD-10-CM

## 2012-01-07 DIAGNOSIS — N899 Noninflammatory disorder of vagina, unspecified: Secondary | ICD-10-CM

## 2012-01-07 DIAGNOSIS — A539 Syphilis, unspecified: Secondary | ICD-10-CM

## 2012-01-07 DIAGNOSIS — O99810 Abnormal glucose complicating pregnancy: Secondary | ICD-10-CM | POA: Insufficient documentation

## 2012-01-07 DIAGNOSIS — O98519 Other viral diseases complicating pregnancy, unspecified trimester: Secondary | ICD-10-CM | POA: Insufficient documentation

## 2012-01-07 DIAGNOSIS — Z21 Asymptomatic human immunodeficiency virus [HIV] infection status: Secondary | ICD-10-CM | POA: Insufficient documentation

## 2012-01-07 DIAGNOSIS — O98119 Syphilis complicating pregnancy, unspecified trimester: Secondary | ICD-10-CM

## 2012-01-07 NOTE — Assessment & Plan Note (Signed)
Doing well and now undetectable.  Pleased with regimen. RTC 1 month for labs and follow up after

## 2012-01-07 NOTE — Progress Notes (Signed)
  Subjective:    Patient ID: Tonya Weeks, female    DOB: [birth year], 21 y.o.   MRN: [id number]  HPI Here for routine follow up.  Continues on norvir, Reyataz and Truvada.  Denies missed doses, labs today now undetectable.  Also with a positive RPR and she tells me that she has had two injections from her OB and is getting number 3 today.  No complaints.     Review of Systems  Constitutional: Negative for fever, fatigue and unexpected weight change.  HENT: Negative for sore throat and trouble swallowing.   Gastrointestinal: Negative for nausea, abdominal pain and diarrhea.  Musculoskeletal: Negative for myalgias and arthralgias.  Skin: Negative for rash.       Objective:   Physical Exam  Constitutional: She appears well-developed and well-nourished. No distress.  Cardiovascular: Normal rate, regular rhythm and normal heart sounds.  Exam reveals no gallop and no friction rub.   No murmur heard. Pulmonary/Chest: Effort normal and breath sounds normal. No respiratory distress. She has no wheezes. She has no rales.  Abdominal:       Gravid           Assessment & Plan:

## 2012-01-07 NOTE — Assessment & Plan Note (Signed)
Has had penicillin injection x 2 andfinish today.  Will get records.

## 2012-01-21 LAB — OB RESULTS CONSOLE RPR: RPR: NONREACTIVE

## 2012-02-09 ENCOUNTER — Other Ambulatory Visit: Payer: Medicaid Other

## 2012-02-09 DIAGNOSIS — B20 Human immunodeficiency virus [HIV] disease: Secondary | ICD-10-CM

## 2012-02-09 DIAGNOSIS — A539 Syphilis, unspecified: Secondary | ICD-10-CM

## 2012-02-10 LAB — T.PALLIDUM AB, TOTAL: T pallidum Antibodies (TP-PA): >8 S/CO — ABNORMAL HIGH (ref <0.90)

## 2012-02-10 LAB — T-HELPER CELL (CD4) - (RCID CLINIC ONLY)
CD4 % Helper T Cell: 25 % — ABNORMAL LOW (ref 33–55)
CD4 T Cell Abs: 490 uL (ref 400–2700)

## 2012-02-15 ENCOUNTER — Telehealth: Payer: Self-pay | Admitting: *Deleted

## 2012-02-15 NOTE — Telephone Encounter (Signed)
Call from [facility], patient's RPR has not come down from Oct.  Unsure if she completed her injections at [facility], does she need to be retreated? Wendall Mola CMA

## 2012-02-18 ENCOUNTER — Ambulatory Visit: Payer: Self-pay

## 2012-02-18 ENCOUNTER — Telehealth (HOSPITAL_COMMUNITY): Payer: Self-pay | Admitting: *Deleted

## 2012-02-18 NOTE — Telephone Encounter (Signed)
Yes, have her retreat with 3 weekly shots. thanks

## 2012-02-23 ENCOUNTER — Ambulatory Visit (INDEPENDENT_AMBULATORY_CARE_PROVIDER_SITE_OTHER): Payer: Medicaid Other | Admitting: Internal Medicine

## 2012-02-23 VITALS — BP 136/82 | HR 99 | Temp 98.0°F | Ht 64.0 in | Wt 185.0 lb

## 2012-02-23 DIAGNOSIS — A539 Syphilis, unspecified: Secondary | ICD-10-CM

## 2012-02-23 DIAGNOSIS — B20 Human immunodeficiency virus [HIV] disease: Secondary | ICD-10-CM

## 2012-02-23 DIAGNOSIS — O98119 Syphilis complicating pregnancy, unspecified trimester: Secondary | ICD-10-CM

## 2012-02-23 MED ORDER — PENICILLIN G BENZATHINE 1200000 UNIT/2ML IM SUSP
1.2000 10*6.[IU] | Freq: Once | INTRAMUSCULAR | Status: AC
  Administered 2012-02-23: 1.2 10*6.[IU] via INTRAMUSCULAR

## 2012-02-23 NOTE — Assessment & Plan Note (Signed)
She did get one injection though I do not have a record. The titer didn't change at all though certainly it was not too long after the injection. She did get penicillin today. I am going to have her restart the treatment with another injection in one week and then in 2 more weeks.

## 2012-02-23 NOTE — Progress Notes (Signed)
  Subjective:    Patient ID: Tonya Weeks, female    DOB: [birth year], 21 y.o.   MRN: [id number]  HPI She comes in for routine followup of HIV. She continues on Norvir, Reyataz and Truvada. She continues to be undetectable. Unfortunately, her RPR remains elevated at a titer of 1-64. This is despite purported treatment on November 5. As far as I can tell, there was no other injection given. 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  Is not currently sexually active with the baby's father who presumably by her report is who gave her the syphilis.   Review of Systems  Constitutional: Negative for fever, chills, fatigue and unexpected weight change.  HENT: Negative for sore throat and trouble swallowing.   Cardiovascular: Negative for leg swelling.  Gastrointestinal: Negative for nausea and abdominal pain.  Skin: Negative for rash.  Neurological: Negative for dizziness.       Objective:   Physical Exam  Constitutional: She appears well-developed and well-nourished. No distress.  Cardiovascular: Normal rate, regular rhythm and normal heart sounds.  Exam reveals no gallop and no friction rub.   No murmur heard. Abdominal:       gravida          Assessment & Plan:

## 2012-02-23 NOTE — Assessment & Plan Note (Signed)
Labs are undetectable for HIV and she will continue with the same. She does have good continued compliance. She knows to get on a death at the end of her pregnancy due to the expiration of Medicaid.

## 2012-02-23 NOTE — Addendum Note (Signed)
Addended by: Wendall Mola A on: 02/23/2012 11:43 AM   Modules accepted: Orders

## 2012-02-28 ENCOUNTER — Encounter (HOSPITAL_COMMUNITY): Payer: Self-pay | Admitting: *Deleted

## 2012-02-28 ENCOUNTER — Inpatient Hospital Stay (HOSPITAL_COMMUNITY)
Admission: AD | Admit: 2012-02-28 | Discharge: 2012-02-28 | Disposition: A | Discharge status: Home / Self Care | Payer: Medicaid Other | Source: Ambulatory Visit | Attending: Obstetrics | Admitting: Obstetrics

## 2012-02-28 DIAGNOSIS — O99891 Other specified diseases and conditions complicating pregnancy: Secondary | ICD-10-CM | POA: Insufficient documentation

## 2012-02-28 DIAGNOSIS — R109 Unspecified abdominal pain: Secondary | ICD-10-CM | POA: Insufficient documentation

## 2012-02-28 NOTE — Progress Notes (Signed)
Dr. Clearance Coots notified of patient c/o of abdominal pain and pressure. Contractions every 7-10 mins on the monitor. Prolonged decel x1 however has been reactive since. SVE closed and thick. Orders to d/c home.

## 2012-02-28 NOTE — MAU Note (Signed)
Pt reports pain and pressure all day

## 2012-03-01 ENCOUNTER — Ambulatory Visit (INDEPENDENT_AMBULATORY_CARE_PROVIDER_SITE_OTHER): Payer: Medicaid Other | Admitting: Licensed Clinical Social Worker

## 2012-03-01 ENCOUNTER — Encounter (HOSPITAL_COMMUNITY): Payer: Self-pay | Admitting: *Deleted

## 2012-03-01 ENCOUNTER — Other Ambulatory Visit: Payer: Self-pay | Admitting: Obstetrics & Gynecology

## 2012-03-01 ENCOUNTER — Telehealth (HOSPITAL_COMMUNITY): Payer: Self-pay | Admitting: *Deleted

## 2012-03-01 DIAGNOSIS — A539 Syphilis, unspecified: Secondary | ICD-10-CM

## 2012-03-01 MED ORDER — PENICILLIN G BENZATHINE 1200000 UNIT/2ML IM SUSP
1.2000 10*6.[IU] | Freq: Once | INTRAMUSCULAR | Status: AC
  Administered 2012-03-01: 1.2 10*6.[IU] via INTRAMUSCULAR

## 2012-03-01 NOTE — Telephone Encounter (Signed)
Preadmission screen  

## 2012-03-07 ENCOUNTER — Institutional Professional Consult (permissible substitution): Payer: Self-pay | Admitting: Pediatrics

## 2012-03-07 ENCOUNTER — Ambulatory Visit (INDEPENDENT_AMBULATORY_CARE_PROVIDER_SITE_OTHER): Payer: Medicaid Other | Admitting: Pediatrics

## 2012-03-07 ENCOUNTER — Ambulatory Visit: Payer: Self-pay

## 2012-03-07 DIAGNOSIS — Z7681 Expectant parent(s) prebirth pediatrician visit: Secondary | ICD-10-CM

## 2012-03-08 ENCOUNTER — Inpatient Hospital Stay (HOSPITAL_COMMUNITY)
Admission: AD | Admit: 2012-03-08 | Discharge: 2012-03-11 | DRG: 774 | Disposition: A | Discharge status: Home / Self Care | Payer: Medicaid Other | Source: Ambulatory Visit | Attending: Obstetrics | Admitting: Obstetrics

## 2012-03-08 ENCOUNTER — Ambulatory Visit: Payer: Self-pay

## 2012-03-08 ENCOUNTER — Encounter (HOSPITAL_COMMUNITY): Payer: Self-pay | Admitting: *Deleted

## 2012-03-08 ENCOUNTER — Inpatient Hospital Stay (HOSPITAL_COMMUNITY): Admission: RE | Admit: 2012-03-08 | Payer: Medicaid Other | Source: Ambulatory Visit

## 2012-03-08 DIAGNOSIS — Z21 Asymptomatic human immunodeficiency virus [HIV] infection status: Secondary | ICD-10-CM | POA: Diagnosis present

## 2012-03-08 DIAGNOSIS — O9902 Anemia complicating childbirth: Secondary | ICD-10-CM | POA: Diagnosis present

## 2012-03-08 DIAGNOSIS — O98119 Syphilis complicating pregnancy, unspecified trimester: Secondary | ICD-10-CM

## 2012-03-08 DIAGNOSIS — O98519 Other viral diseases complicating pregnancy, unspecified trimester: Secondary | ICD-10-CM | POA: Diagnosis present

## 2012-03-08 DIAGNOSIS — D573 Sickle-cell trait: Secondary | ICD-10-CM | POA: Diagnosis present

## 2012-03-08 DIAGNOSIS — O99814 Abnormal glucose complicating childbirth: Secondary | ICD-10-CM | POA: Diagnosis present

## 2012-03-08 DIAGNOSIS — B20 Human immunodeficiency virus [HIV] disease: Secondary | ICD-10-CM

## 2012-03-08 LAB — CBC
HCT: 29.5 % — ABNORMAL LOW (ref 36.0–46.0)
MCH: 22.3 pg — ABNORMAL LOW (ref 26.0–34.0)
MCV: 70.7 fL — ABNORMAL LOW (ref 78.0–100.0)
RBC: 4.17 MIL/uL (ref 3.87–5.11)
WBC: 8 10*3/uL (ref 4.0–10.5)

## 2012-03-08 MED ORDER — IBUPROFEN 600 MG PO TABS: 600.0000 mg | ORAL_TABLET | Freq: Four times a day (QID) | ORAL | Status: DC | PRN

## 2012-03-08 MED ORDER — NALBUPHINE SYRINGE 5 MG/0.5 ML
10.0000 mg | INJECTION | INTRAMUSCULAR | Status: DC | PRN
  Filled 2012-03-08: qty 0.5
  Filled 2012-03-08: qty 1

## 2012-03-08 MED ORDER — NALBUPHINE SYRINGE 5 MG/0.5 ML
10.0000 mg | INJECTION | INTRAMUSCULAR | Status: DC | PRN
  Filled 2012-03-08: qty 1

## 2012-03-08 MED ORDER — RITONAVIR 100 MG PO TABS
100.0000 mg | ORAL_TABLET | Freq: Every day | ORAL | Status: DC
  Administered 2012-03-08 – 2012-03-10 (×3): 100 mg via ORAL
  Filled 2012-03-08 (×4): qty 1

## 2012-03-08 MED ORDER — PROMETHAZINE HCL 25 MG/ML IJ SOLN: 25.0000 mg | Freq: Four times a day (QID) | INTRAMUSCULAR | Status: DC | PRN

## 2012-03-08 MED ORDER — LACTATED RINGERS IV SOLN
INTRAVENOUS | Status: DC
  Administered 2012-03-09: 125 mL/h via INTRAVENOUS
  Administered 2012-03-09 (×2): via INTRAVENOUS

## 2012-03-08 MED ORDER — OXYTOCIN BOLUS FROM INFUSION
500.0000 mL | INTRAVENOUS | Status: DC
  Administered 2012-03-09: 500 mL via INTRAVENOUS

## 2012-03-08 MED ORDER — EMTRICITABINE-TENOFOVIR DF 200-300 MG PO TABS
1.0000 | ORAL_TABLET | Freq: Every day | ORAL | Status: DC
  Administered 2012-03-08 – 2012-03-10 (×3): 1 via ORAL
  Filled 2012-03-08 (×4): qty 1

## 2012-03-08 MED ORDER — LACTATED RINGERS IV SOLN
500.0000 mL | INTRAVENOUS | Status: DC | PRN
  Administered 2012-03-09: 300 mL via INTRAVENOUS

## 2012-03-08 MED ORDER — TERBUTALINE SULFATE 1 MG/ML IJ SOLN: 0.2500 mg | Freq: Once | INTRAMUSCULAR | Status: AC | PRN

## 2012-03-08 MED ORDER — ZIDOVUDINE 10 MG/ML IV SOLN: 1.0000 mg/kg/h | INTRAVENOUS | Status: DC

## 2012-03-08 MED ORDER — ATAZANAVIR SULFATE 150 MG PO CAPS
300.0000 mg | ORAL_CAPSULE | Freq: Every day | ORAL | Status: DC
  Administered 2012-03-08 – 2012-03-10 (×3): 300 mg via ORAL
  Filled 2012-03-08 (×4): qty 2

## 2012-03-08 MED ORDER — DEXTROSE 5 % IV SOLN: 2.0000 mg/kg | Freq: Once | INTRAVENOUS | Status: DC

## 2012-03-08 MED ORDER — INTEGRA PLUS PO CAPS: 1.0000 | ORAL_CAPSULE | ORAL | Status: DC

## 2012-03-08 MED ORDER — MISOPROSTOL 25 MCG QUARTER TABLET
25.0000 ug | ORAL_TABLET | ORAL | Status: DC | PRN
  Administered 2012-03-08 – 2012-03-09 (×3): 25 ug via VAGINAL
  Filled 2012-03-08 (×3): qty 0.25
  Filled 2012-03-08: qty 1

## 2012-03-08 MED ORDER — LIDOCAINE HCL (PF) 1 % IJ SOLN
30.0000 mL | INTRAMUSCULAR | Status: DC | PRN
  Administered 2012-03-09: 30 mL via SUBCUTANEOUS
  Filled 2012-03-08: qty 30

## 2012-03-08 MED ORDER — ONDANSETRON HCL 4 MG/2ML IJ SOLN: 4.0000 mg | Freq: Four times a day (QID) | INTRAMUSCULAR | Status: DC | PRN

## 2012-03-08 MED ORDER — PRENATAL MULTIVITAMIN CH
1.0000 | ORAL_TABLET | Freq: Every day | ORAL | Status: DC
  Administered 2012-03-08: 1 via ORAL
  Filled 2012-03-08: qty 1

## 2012-03-08 MED ORDER — ACETAMINOPHEN 325 MG PO TABS
650.0000 mg | ORAL_TABLET | ORAL | Status: DC | PRN
  Administered 2012-03-09: 650 mg via ORAL
  Filled 2012-03-08: qty 2

## 2012-03-08 MED ORDER — OXYCODONE-ACETAMINOPHEN 5-325 MG PO TABS
1.0000 | ORAL_TABLET | ORAL | Status: DC | PRN
Start: 2012-03-08 — End: 2012-03-09

## 2012-03-08 MED ORDER — CITRIC ACID-SODIUM CITRATE 334-500 MG/5ML PO SOLN: 30.0000 mL | ORAL | Status: DC | PRN

## 2012-03-08 MED ORDER — OXYTOCIN 40 UNITS IN LACTATED RINGERS INFUSION - SIMPLE MED
62.5000 mL/h | INTRAVENOUS | Status: DC
  Administered 2012-03-09: 62.5 mL/h via INTRAVENOUS
  Filled 2012-03-08: qty 1000

## 2012-03-08 NOTE — H&P (Signed)
Tonya Weeks is a 21 y.o. female presenting for IOL. Maternal Medical History:  Reason for admission: 20 yo G3 P0020.  EDC 03-09-12.  Presents for IOL.  Fetal activity: Perceived fetal activity is normal.   Last perceived fetal movement was within the past hour.    Prenatal complications: HIV and infection.   Prenatal Complications - Diabetes: none.    OB History    Grav Para Term Preterm Abortions TAB SAB Ect Mult Living   3    2 2          Past Medical History  Diagnosis Date  . HIV infection   . HIV (human immunodeficiency virus infection)   . Sickle cell trait   . Syphilis    Past Surgical History  Procedure Date  . Dilation and curettage, diagnostic / therapeutic    Family History: family history includes Arthritis in her maternal grandfather and maternal grandmother; Asthma in her maternal grandmother; Diabetes in her maternal grandfather and maternal grandmother; Heart attack in her maternal grandfather, maternal grandmother, and mother; Heart failure in her maternal grandfather, maternal grandmother, and mother; Hypertension in her father, maternal grandfather, maternal grandmother, and mother; Kidney disease in her maternal grandfather; Lupus in her mother; Scleroderma in her mother; Sickle cell trait in her mother; and Stroke in her maternal grandfather and maternal grandmother.  There is no history of Other. Social History:  reports that she has never smoked. She has never used smokeless tobacco. She reports that she does not drink alcohol or use illicit drugs.   Prenatal Transfer Tool  Maternal Diabetes: No Genetic Screening: Normal Maternal Ultrasounds/Referrals: Normal Fetal Ultrasounds or other Referrals:  None Maternal Substance Abuse:  No Significant Maternal Medications:  Meds include: Other:   Antiretroviral Significant Maternal Lab Results:  Lab values include: Group B Strep negative Other Comments:  Maternal - positive HIV.  Review of Systems  All  other systems reviewed and are negative.      Blood pressure 133/77, pulse 96, temperature 97.6 F (36.4 C), temperature source Oral, resp. rate 18, height 5\' 4"  (1.626 m), weight 185 lb (83.915 kg), last menstrual period 06/08/2011, unknown if currently breastfeeding. Maternal Exam:  Abdomen: Patient reports no abdominal tenderness. Fetal presentation: vertex  Pelvis: adequate for delivery.   Cervix: Cervix evaluated by digital exam.     Physical Exam  Nursing note and vitals reviewed. Constitutional: She is oriented to person, place, and time. She appears well-developed and well-nourished.  HENT:  Head: Normocephalic and atraumatic.  Eyes: Conjunctivae normal are normal. Pupils are equal, round, and reactive to light.  Neck: Normal range of motion. Neck supple.  Cardiovascular: Normal rate and regular rhythm.   Respiratory: Effort normal.  GI: Soft.  Musculoskeletal: Normal range of motion.  Neurological: She is alert and oriented to person, place, and time.  Skin: Skin is warm and dry.  Psychiatric: She has a normal mood and affect. Her behavior is normal. Judgment and thought content normal.    Prenatal labs: ABO, Rh: O/Positive/-- (05/16 0000) Antibody: Negative (05/16 0000) Rubella: Immune (05/16 0000) RPR: REACTIVE (11/12 1126)  HBsAg: NEGATIVE (06/04 1129)  HIV: Reactive (05/16 0000)  GBS: Negative (11/22 0000)   Assessment/Plan: 39.6 weeks.  2 stage IOL.   HARPER,CHARLES A 03/08/2012, 7:48 PM

## 2012-03-09 ENCOUNTER — Inpatient Hospital Stay (HOSPITAL_COMMUNITY): Payer: Medicaid Other | Admitting: Anesthesiology

## 2012-03-09 ENCOUNTER — Encounter (HOSPITAL_COMMUNITY): Payer: Self-pay | Admitting: Anesthesiology

## 2012-03-09 ENCOUNTER — Encounter (HOSPITAL_COMMUNITY): Payer: Self-pay | Admitting: Emergency Medicine

## 2012-03-09 LAB — GLUCOSE, CAPILLARY
Glucose-Capillary: 90 mg/dL (ref 70–99)
Glucose-Capillary: 127 mg/dL — ABNORMAL HIGH (ref 70–99)
Glucose-Capillary: 80 mg/dL (ref 70–99)

## 2012-03-09 MED ORDER — ZIDOVUDINE 10 MG/ML IV SOLN
2.0000 mg/kg | Freq: Once | INTRAVENOUS | Status: AC
  Administered 2012-03-09: 168 mg via INTRAVENOUS
  Filled 2012-03-09: qty 16.8

## 2012-03-09 MED ORDER — ZOLPIDEM TARTRATE 5 MG PO TABS: 5.0000 mg | ORAL_TABLET | Freq: Every evening | ORAL | Status: DC | PRN

## 2012-03-09 MED ORDER — IBUPROFEN 600 MG PO TABS
600.0000 mg | ORAL_TABLET | Freq: Four times a day (QID) | ORAL | Status: DC
  Administered 2012-03-09 – 2012-03-11 (×8): 600 mg via ORAL
  Filled 2012-03-09 (×8): qty 1

## 2012-03-09 MED ORDER — TETANUS-DIPHTH-ACELL PERTUSSIS 5-2.5-18.5 LF-MCG/0.5 IM SUSP: 0.5000 mL | Freq: Once | INTRAMUSCULAR | Status: DC

## 2012-03-09 MED ORDER — DIPHENHYDRAMINE HCL 25 MG PO CAPS: 25.0000 mg | ORAL_CAPSULE | Freq: Four times a day (QID) | ORAL | Status: DC | PRN

## 2012-03-09 MED ORDER — FERROUS SULFATE 325 (65 FE) MG PO TABS
325.0000 mg | ORAL_TABLET | Freq: Two times a day (BID) | ORAL | Status: DC
  Administered 2012-03-09 – 2012-03-11 (×4): 325 mg via ORAL
  Filled 2012-03-09 (×4): qty 1

## 2012-03-09 MED ORDER — DIPHENHYDRAMINE HCL 50 MG/ML IJ SOLN: 12.5000 mg | INTRAMUSCULAR | Status: DC | PRN

## 2012-03-09 MED ORDER — FENTANYL 2.5 MCG/ML BUPIVACAINE 1/10 % EPIDURAL INFUSION (WH - ANES)
14.0000 mL/h | INTRAMUSCULAR | Status: DC
  Administered 2012-03-09: 14 mL/h via EPIDURAL
  Filled 2012-03-09 (×2): qty 125

## 2012-03-09 MED ORDER — WITCH HAZEL-GLYCERIN EX PADS: 1.0000 "application " | MEDICATED_PAD | CUTANEOUS | Status: DC | PRN

## 2012-03-09 MED ORDER — ZIDOVUDINE 10 MG/ML IV SOLN
1.0000 mg/kg/h | INTRAVENOUS | Status: DC
  Administered 2012-03-09 (×2): 1 mg/kg/h via INTRAVENOUS
  Filled 2012-03-09 (×2): qty 40

## 2012-03-09 MED ORDER — MAGNESIUM HYDROXIDE 400 MG/5ML PO SUSP: 30.0000 mL | ORAL | Status: DC | PRN

## 2012-03-09 MED ORDER — ONDANSETRON HCL 4 MG/2ML IJ SOLN: 4.0000 mg | INTRAMUSCULAR | Status: DC | PRN

## 2012-03-09 MED ORDER — INTEGRA PLUS PO CAPS: 1.0000 | ORAL_CAPSULE | ORAL | Status: DC

## 2012-03-09 MED ORDER — PRENATAL MULTIVITAMIN CH
1.0000 | ORAL_TABLET | Freq: Every day | ORAL | Status: DC
  Administered 2012-03-10 – 2012-03-11 (×2): 1 via ORAL
  Filled 2012-03-09 (×2): qty 1

## 2012-03-09 MED ORDER — EPHEDRINE 5 MG/ML INJ: 10.0000 mg | INTRAVENOUS | Status: DC | PRN

## 2012-03-09 MED ORDER — PHENYLEPHRINE 40 MCG/ML (10ML) SYRINGE FOR IV PUSH (FOR BLOOD PRESSURE SUPPORT): 80.0000 ug | PREFILLED_SYRINGE | INTRAVENOUS | Status: DC | PRN

## 2012-03-09 MED ORDER — PHENYLEPHRINE 40 MCG/ML (10ML) SYRINGE FOR IV PUSH (FOR BLOOD PRESSURE SUPPORT)
80.0000 ug | PREFILLED_SYRINGE | INTRAVENOUS | Status: DC | PRN
  Filled 2012-03-09: qty 5

## 2012-03-09 MED ORDER — LANOLIN HYDROUS EX OINT: TOPICAL_OINTMENT | CUTANEOUS | Status: DC | PRN

## 2012-03-09 MED ORDER — SENNOSIDES-DOCUSATE SODIUM 8.6-50 MG PO TABS
2.0000 | ORAL_TABLET | Freq: Every day | ORAL | Status: DC
  Administered 2012-03-09 – 2012-03-10 (×2): 2 via ORAL

## 2012-03-09 MED ORDER — BENZOCAINE-MENTHOL 20-0.5 % EX AERO
1.0000 "application " | INHALATION_SPRAY | CUTANEOUS | Status: DC | PRN
  Filled 2012-03-09: qty 56

## 2012-03-09 MED ORDER — FENTANYL 2.5 MCG/ML BUPIVACAINE 1/10 % EPIDURAL INFUSION (WH - ANES)
INTRAMUSCULAR | Status: DC | PRN
  Administered 2012-03-09: 14 mL/h via EPIDURAL

## 2012-03-09 MED ORDER — DIBUCAINE 1 % RE OINT
1.0000 "application " | TOPICAL_OINTMENT | RECTAL | Status: DC | PRN
  Filled 2012-03-09: qty 28

## 2012-03-09 MED ORDER — OXYCODONE-ACETAMINOPHEN 5-325 MG PO TABS
1.0000 | ORAL_TABLET | ORAL | Status: DC | PRN
  Administered 2012-03-10: 2 via ORAL
  Filled 2012-03-09: qty 2

## 2012-03-09 MED ORDER — LACTATED RINGERS IV SOLN: 500.0000 mL | Freq: Once | INTRAVENOUS | Status: DC

## 2012-03-09 MED ORDER — MEASLES, MUMPS & RUBELLA VAC ~~LOC~~ INJ: 0.5000 mL | INJECTION | Freq: Once | SUBCUTANEOUS | Status: DC

## 2012-03-09 MED ORDER — EPHEDRINE 5 MG/ML INJ
10.0000 mg | INTRAVENOUS | Status: DC | PRN
  Filled 2012-03-09: qty 4

## 2012-03-09 MED ORDER — LIDOCAINE HCL (PF) 1 % IJ SOLN
INTRAMUSCULAR | Status: DC | PRN
  Administered 2012-03-09 (×2): 4 mL

## 2012-03-09 MED ORDER — ONDANSETRON HCL 4 MG PO TABS: 4.0000 mg | ORAL_TABLET | ORAL | Status: DC | PRN

## 2012-03-09 NOTE — Progress Notes (Signed)
Did "test" push with 2 contractions, pt not feeling pressure, not moving head; will continue to labor down

## 2012-03-09 NOTE — Progress Notes (Signed)
Dr. Clearance Coots notified of thick meconium amniototic fluid and SVE.  MD notified that AZT loading dose has been initiated.  MD informed of occasional,  spontaneous decels throughout the night.

## 2012-03-09 NOTE — Progress Notes (Signed)
Patient has leakage of clear fluid and yellowish mucous.  Patient complains of scant amount of fluid that continues to leak.

## 2012-03-09 NOTE — Anesthesia Preprocedure Evaluation (Signed)
Anesthesia Evaluation  Patient identified by MRN, date of birth, ID band Patient awake    Reviewed: Allergy & Precautions, H&P , NPO status   Airway Mallampati: III TM Distance: >3 FB Neck ROM: Full    Dental No notable dental hx. (+) Teeth Intact   Pulmonary neg pulmonary ROS,  breath sounds clear to auscultation  Pulmonary exam normal       Cardiovascular negative cardio ROS  Rhythm:Regular Rate:Normal     Neuro/Psych negative neurological ROS  negative psych ROS   GI/Hepatic negative GI ROS, Neg liver ROS,   Endo/Other  negative endocrine ROS  Renal/GU negative Renal ROS  negative genitourinary   Musculoskeletal negative musculoskeletal ROS (+)   Abdominal (+) + obese,   Peds  Hematology  (+) Blood dyscrasia, Sickle cell trait , HIV Positive   Anesthesia Other Findings   Reproductive/Obstetrics Hx/o Syphilis                           Anesthesia Physical Anesthesia Plan  ASA: III  Anesthesia Plan: Epidural   Post-op Pain Management:    Induction:   Airway Management Planned: Natural Airway  Additional Equipment:   Intra-op Plan:   Post-operative Plan:   Informed Consent: I have reviewed the patients History and Physical, chart, labs and discussed the procedure including the risks, benefits and alternatives for the proposed anesthesia with the patient or authorized representative who has indicated his/her understanding and acceptance.   Dental advisory given  Plan Discussed with: CRNA and Anesthesiologist  Anesthesia Plan Comments:         Anesthesia Quick Evaluation

## 2012-03-09 NOTE — Progress Notes (Signed)
Tonya Weeks is a 21 y.o. G3P0020 at [redacted]w[redacted]d by LMP admitted for induction of labor due to Gestational diabetes.  Subjective: Comfortable  Objective: BP 112/58  Pulse 82  Temp 98.1 F (36.7 C) (Oral)  Resp 20  Ht 5\' 4"  (1.626 m)  Wt 83.915 kg (185 lb)  BMI 31.76 kg/m2  SpO2 91%  LMP 06/08/2011  Breastfeeding? Unknown      FHT:  FHR: 140 bpm, variability: moderate,  accelerations:  Abscent,  decelerations:  Present rare variable decels, rare late decels UC:   irregular, every 2 minutes SVE:   Dilation: 10 Effacement (%): 90 Station: +2 Exam by:: K.Forsell,RNC  Labs: Lab Results  Component Value Date   WBC 8.0 03/08/2012   HGB 9.3* 03/08/2012   HCT 29.5* 03/08/2012   MCV 70.7* 03/08/2012   PLT 174 03/08/2012    Assessment / Plan: Stage II  Labor: Progressing normally and passive second stage for now Preeclampsia:  n/a Fetal Wellbeing:  Category II and long term variability reassuring Pain Control:  Epidural I/D:  n/a Anticipated MOD:  NSVD  JACKSON-MOORE,Cassity Christian A 03/09/2012, 12:46 PM

## 2012-03-09 NOTE — Progress Notes (Signed)
Dr. Clearance Coots notified of SVE and patient's request for an epidural.  Order received.

## 2012-03-09 NOTE — Progress Notes (Signed)
Called Dr Tamela Oddi and updated on fhr and dilitation; told that fhr has had some decels, but not consistent and variability continues to be average; told to have pt push when at +2 station and let Dr know when start pushing

## 2012-03-09 NOTE — Anesthesia Procedure Notes (Signed)
Epidural Patient location during procedure: OB Start time: 03/09/2012 7:25 AM  Staffing Anesthesiologist: Zaedyn Covin A. Performed by: anesthesiologist   Preanesthetic Checklist Completed: patient identified, site marked, surgical consent, pre-op evaluation, timeout performed, IV checked, risks and benefits discussed and monitors and equipment checked  Epidural Patient position: sitting Prep: site prepped and draped and DuraPrep Patient monitoring: continuous pulse ox and blood pressure Approach: midline Injection technique: LOR air  Needle:  Needle type: Tuohy  Needle gauge: 17 G Needle length: 9 cm and 9 Needle insertion depth: 6 cm Catheter type: closed end flexible Catheter size: 19 Gauge Catheter at skin depth: 11 cm Test dose: negative and Other  Assessment Events: blood not aspirated, injection not painful, no injection resistance, negative IV test and no paresthesia  Additional Notes Patient identified. Risks and benefits discussed including failed block, incomplete  Pain control, post dural puncture headache, nerve damage, paralysis, blood pressure Changes, nausea, vomiting, reactions to medications-both toxic and allergic and post Partum back pain. All questions were answered. Patient expressed understanding and wished to proceed. Sterile technique was used throughout procedure. Epidural site was Dressed with sterile barrier dressing. No paresthesias, signs of intravascular injection Or signs of intrathecal spread were encountered.  Patient was more comfortable after the epidural was dosed. Please see RN's note for documentation of vital signs and FHR which are stable.

## 2012-03-10 LAB — HEMOGLOBIN AND HEMATOCRIT, BLOOD
HCT: 25.1 % — ABNORMAL LOW (ref 36.0–46.0)
Hemoglobin: 7.9 g/dL — ABNORMAL LOW (ref 12.0–15.0)

## 2012-03-10 NOTE — Progress Notes (Signed)
Post Partum Day 1 Subjective: no complaints  Objective: Blood pressure 111/72, pulse 112, temperature 97.4 F (36.3 C), temperature source Oral, resp. rate 18, height 5\' 4"  (1.626 m), weight 185 lb (83.915 kg), last menstrual period 06/08/2011, SpO2 91.00%, unknown if currently breastfeeding.  Physical Exam:  General: alert and no distress Lochia: appropriate Uterine Fundus: firm Incision: healing well DVT Evaluation: No evidence of DVT seen on physical exam.   Basename 03/10/12 0545 03/08/12 1830  HGB 7.9* 9.3*  HCT 25.1* 29.5*    Assessment/Plan: Plan for discharge tomorrow   LOS: 2 days   HARPER,CHARLES A 03/10/2012, 1:42 PM

## 2012-03-10 NOTE — Clinical Social Work Maternal (Signed)
    Clinical Social Work Department PSYCHOSOCIAL ASSESSMENT - MATERNAL/CHILD 03/10/2012  Patient:  Tonya Weeks, Tonya Weeks  Account Number:  [id number]  Admit Date:  03/08/2012  Marjo Bicker Name:   Tonya Weeks [facility]    Clinical Social Worker:  Nobie Putnam, LCSW   Date/Time:  03/10/2012 02:40 PM  Date Referred:  03/10/2012   Referral source  CN     Referred reason  O42   Other referral source:    I:  FAMILY / HOME ENVIRONMENT Child's legal guardian:  PARENT  Guardian - Name Guardian - Age Guardian - Address  Tonya Weeks [address] [place], [place] [address]  Tonya Weeks [address] [place]   Other household support members/support persons Name Relationship DOB  Tonya Weeks MOTHER    Other support:    II  PSYCHOSOCIAL DATA Information Source:  Patient Interview  Event organiser Employment:   [employer]   Financial resources:  Medicaid If Medicaid - County:  [place] Other  [employer]   School / Grade:   Maternity Care Coordinator / Child Services Coordination / Early Interventions:  Cultural issues impacting care:    III  STRENGTHS  Strength comment:    IV  RISK FACTORS AND CURRENT PROBLEMS Current Problem:  YES   Risk Factor & Current Problem Patient Issue Family Issue Risk Factor / Current Problem Comment  Adjustment to Illness Y N o42    V  SOCIAL WORK ASSESSMENT CSW met with pt to offer support/resources and schedule follow up appointment for the infant.  Pt received diagnoses at the beginning of pregnancy.  FOB is unaware of pt's status, however pt's told CSW that she gave his name & contact information to [place] at the [employer].  According to the pt, she and FOB were not speaking during the pregnancy and have not have sexual relations since May 2013.  Pt has confided in her mother and aunt, who are good supports.  No one else is aware of diagnoses and pt would like for it to remain confidential.  Pt admits to feeling depressed when she  learned of diagnoses but started to feel better after her levels were low.  Pt is trying to have a positive attitude about her situation and understands the importance of taking her medications regularly.  She has not sought any formal counseling to help her deal with diagnoses, as she states she is fine now.  She plans to [facility] the infant to [facility] for follow up appointment. CSW scheduled appointment for Wednesday, December 18th @ 11:30. CSW will provide appointment times and information packet to pt.  She has all the necessary supplies for the infant and appears to be bonding well.  CSW left a message for someone at the Health Department to call to verify that FOB has been notified.  CSW will also provide pt with information on [employer], as a future resource, if counseling desired.  CSW available to assist further if needed.      VI SOCIAL WORK PLAN Social Work Plan  No Further Intervention Required / No Barriers to Discharge   Type of pt/family education:   If child protective services report - county:   If child protective services report - date:   Information/referral to community resources comment:   Other social work plan:

## 2012-03-10 NOTE — Progress Notes (Signed)
UR chart review completed.  

## 2012-03-11 MED ORDER — FUSION PLUS PO CAPS: 1.0000 | ORAL_CAPSULE | Freq: Every day | ORAL | Status: DC

## 2012-03-11 MED ORDER — OXYCODONE-ACETAMINOPHEN 5-325 MG PO TABS: 1.0000 | ORAL_TABLET | ORAL | Status: DC | PRN

## 2012-03-11 MED ORDER — SERTRALINE HCL 50 MG PO TABS
50.0000 mg | ORAL_TABLET | Freq: Every day | ORAL | Status: DC
  Administered 2012-03-11: 50 mg via ORAL
  Filled 2012-03-11 (×2): qty 1

## 2012-03-11 MED ORDER — IBUPROFEN 600 MG PO TABS: 600.0000 mg | ORAL_TABLET | Freq: Four times a day (QID) | ORAL | Status: DC | PRN

## 2012-03-11 MED ORDER — SERTRALINE HCL 50 MG PO TABS: 50.0000 mg | ORAL_TABLET | Freq: Every day | ORAL | Status: DC

## 2012-03-11 NOTE — Anesthesia Postprocedure Evaluation (Signed)
  Anesthesia Post-op Note  Patient: Tonya Weeks  Procedure(s) Performed: Lumbar Epidural for L&D  No apparent anesthetic complications. Pt has been discharged home

## 2012-03-11 NOTE — Progress Notes (Signed)
Post Partum Day 2 Subjective: no complaints  Objective: Blood pressure 118/81, pulse 85, temperature 98.7 F (37.1 C), temperature source Oral, resp. rate 18, height 5\' 4"  (1.626 m), weight 185 lb (83.915 kg), last menstrual period 06/08/2011, SpO2 91.00%, unknown if currently breastfeeding.  Physical Exam:  General: alert and no distress Lochia: appropriate Uterine Fundus: firm Incision: healing well DVT Evaluation: No evidence of DVT seen on physical exam.   Basename 03/10/12 0545 03/08/12 1830  HGB 7.9* 9.3*  HCT 25.1* 29.5*    Assessment/Plan: Discharge home.  Anemia.  Stable.  Iron Rx.   LOS: 3 days   HARPER,CHARLES A 03/11/2012, 11:32 AM

## 2012-03-11 NOTE — Discharge Summary (Signed)
Obstetric Discharge Summary Reason for Admission: induction of labor Prenatal Procedures: NST and ultrasound Intrapartum Procedures: spontaneous vaginal delivery Postpartum Procedures: none Complications-Operative and Postpartum: none Hemoglobin  Date Value Range Status  03/10/2012 7.9* 12.0 - 15.0 g/dL Final     HCT  Date Value Range Status  03/10/2012 25.1* 36.0 - 46.0 % Final    Physical Exam:  General: alert and no distress Lochia: appropriate Uterine Fundus: firm Incision: none DVT Evaluation: No evidence of DVT seen on physical exam.  Discharge Diagnoses: Term Pregnancy-delivered  Discharge Information: Date: 03/11/2012 Activity: pelvic rest Diet: routine Medications: PNV, Ibuprofen, Colace and Percocet Iron Condition: stable Instructions: refer to practice specific booklet Discharge to: home Follow-up Information    Follow up with Antionette Char A, MD. Schedule an appointment as soon as possible for a visit in 6 weeks.   Contact information:   [address] [place] [address] [phone]          Newborn Data: Live born female  Birth Weight: 8 lb 3.6 oz (3730 g) APGAR: 7, 9  Home with mother.  Calbert Hulsebus A 03/11/2012, 11:38 AM

## 2012-03-15 ENCOUNTER — Other Ambulatory Visit (INDEPENDENT_AMBULATORY_CARE_PROVIDER_SITE_OTHER): Payer: Medicaid Other

## 2012-03-15 ENCOUNTER — Ambulatory Visit (INDEPENDENT_AMBULATORY_CARE_PROVIDER_SITE_OTHER): Payer: Medicaid Other | Admitting: *Deleted

## 2012-03-15 DIAGNOSIS — B20 Human immunodeficiency virus [HIV] disease: Secondary | ICD-10-CM

## 2012-03-15 DIAGNOSIS — A539 Syphilis, unspecified: Secondary | ICD-10-CM

## 2012-03-15 MED ORDER — PENICILLIN G BENZATHINE 1200000 UNIT/2ML IM SUSP
1.2000 10*6.[IU] | Freq: Once | INTRAMUSCULAR | Status: AC
  Administered 2012-03-15: 1.2 10*6.[IU] via INTRAMUSCULAR

## 2012-03-15 MED ORDER — PENICILLIN G BENZATHINE 1200000 UNIT/2ML IM SUSP
1.2000 10*6.[IU] | Freq: Once | INTRAMUSCULAR | Status: AC
Start: 2012-03-15 — End: 2012-03-15
  Administered 2012-03-15: 1.2 10*6.[IU] via INTRAMUSCULAR

## 2012-04-04 ENCOUNTER — Emergency Department (HOSPITAL_COMMUNITY)
Admission: EM | Admit: 2012-04-04 | Discharge: 2012-04-04 | Disposition: A | Discharge status: Home / Self Care | Payer: Medicaid Other | Attending: Emergency Medicine | Admitting: Emergency Medicine

## 2012-04-04 ENCOUNTER — Emergency Department (HOSPITAL_COMMUNITY): Payer: Medicaid Other

## 2012-04-04 ENCOUNTER — Encounter (HOSPITAL_COMMUNITY): Payer: Self-pay | Admitting: *Deleted

## 2012-04-04 DIAGNOSIS — D573 Sickle-cell trait: Secondary | ICD-10-CM | POA: Insufficient documentation

## 2012-04-04 DIAGNOSIS — R112 Nausea with vomiting, unspecified: Secondary | ICD-10-CM | POA: Insufficient documentation

## 2012-04-04 DIAGNOSIS — J069 Acute upper respiratory infection, unspecified: Secondary | ICD-10-CM

## 2012-04-04 DIAGNOSIS — Z8619 Personal history of other infectious and parasitic diseases: Secondary | ICD-10-CM | POA: Insufficient documentation

## 2012-04-04 DIAGNOSIS — R509 Fever, unspecified: Secondary | ICD-10-CM | POA: Insufficient documentation

## 2012-04-04 DIAGNOSIS — R52 Pain, unspecified: Secondary | ICD-10-CM | POA: Insufficient documentation

## 2012-04-04 DIAGNOSIS — Z79899 Other long term (current) drug therapy: Secondary | ICD-10-CM | POA: Insufficient documentation

## 2012-04-04 DIAGNOSIS — J111 Influenza due to unidentified influenza virus with other respiratory manifestations: Secondary | ICD-10-CM

## 2012-04-04 DIAGNOSIS — B20 Human immunodeficiency virus [HIV] disease: Secondary | ICD-10-CM | POA: Insufficient documentation

## 2012-04-04 MED ORDER — IBUPROFEN 200 MG PO TABS
600.0000 mg | ORAL_TABLET | Freq: Once | ORAL | Status: AC
  Administered 2012-04-04: 600 mg via ORAL
  Filled 2012-04-04: qty 3

## 2012-04-04 MED ORDER — OSELTAMIVIR PHOSPHATE 75 MG PO CAPS: 75.0000 mg | ORAL_CAPSULE | Freq: Two times a day (BID) | ORAL | Status: DC

## 2012-04-04 NOTE — ED Provider Notes (Addendum)
History     CSN: [id number]  Arrival date & time 04/04/12  1217   First MD Initiated Contact with Patient 04/04/12 1250      Chief Complaint  Patient presents with  . Fever  . Generalized Body Aches  . Nausea  . Emesis    (Consider location/radiation/quality/duration/timing/severity/associated sxs/prior treatment) Patient is a 22 y.o. female presenting with fever and vomiting. The history is provided by the patient.  Fever Primary symptoms of the febrile illness include fever and cough. Primary symptoms do not include headaches, shortness of breath, abdominal pain, vomiting, diarrhea, dysuria or rash.  Emesis  Associated symptoms include cough and a fever. Pertinent negatives include no abdominal pain, no chills, no diarrhea and no headaches.  pt c/o fever, scratchy throat, non productive cough, nasal congestion, body aches, and headaches for past day. t 103. Denies cp or trouble breathing. No nvd. No known ill contacts. Hx hiv. Headaches mild, frontal, gradual in onset. No neck pain or stiffness. No rash. Had flu shot this year. Is eating and drinking. No abd pain. No dysuria or gu c/o.     Past Medical History  Diagnosis Date  . HIV infection   . HIV (human immunodeficiency virus infection)   . Sickle cell trait   . Syphilis     Past Surgical History  Procedure Date  . Dilation and curettage, diagnostic / therapeutic     Family History  Problem Relation Age of Onset  . Lupus Mother   . Hypertension Mother   . Heart failure Mother   . Heart attack Mother   . Sickle cell trait Mother   . Scleroderma Mother   . Other Neg Hx   . Hypertension Father   . Arthritis Maternal Grandmother   . Diabetes Maternal Grandmother   . Hypertension Maternal Grandmother   . Stroke Maternal Grandmother   . Heart failure Maternal Grandmother   . Heart attack Maternal Grandmother   . Asthma Maternal Grandmother   . Arthritis Maternal Grandfather   . Diabetes Maternal Grandfather     . Hypertension Maternal Grandfather   . Stroke Maternal Grandfather   . Heart failure Maternal Grandfather   . Heart attack Maternal Grandfather   . Kidney disease Maternal Grandfather     History  Substance Use Topics  . Smoking status: Never Smoker   . Smokeless tobacco: Never Used  . Alcohol Use: No    OB History    Grav Para Term Preterm Abortions TAB SAB Ect Mult Living   4 1 1  2 2    1       Review of Systems  Constitutional: Positive for fever. Negative for chills.  HENT: Positive for congestion and rhinorrhea. Negative for neck pain and neck stiffness.   Eyes: Negative for discharge and redness.  Respiratory: Positive for cough. Negative for shortness of breath.   Cardiovascular: Negative for chest pain.  Gastrointestinal: Negative for vomiting, abdominal pain and diarrhea.  Genitourinary: Negative for dysuria and flank pain.  Musculoskeletal: Negative for back pain.  Skin: Negative for rash.  Neurological: Negative for headaches.  Hematological: Does not bruise/bleed easily.  Psychiatric/Behavioral: Negative for confusion.    Allergies  Review of patient's allergies indicates no known allergies.  Home Medications   Current Outpatient Rx  Name  Route  Sig  Dispense  Refill  . ATAZANAVIR SULFATE 300 MG PO CAPS   Oral   Take 300 mg by mouth at bedtime. HIV medication.  Consult needed.         Marland Kitchen  EMTRICITABINE-TENOFOVIR 200-300 MG PO TABS   Oral   Take 1 tablet by mouth at bedtime. HIV medication.  Consult needed.         . IBUPROFEN 600 MG PO TABS   Oral   Take 1 tablet (600 mg total) by mouth every 6 (six) hours as needed for pain.   30 tablet   5   . FUSION PLUS PO CAPS   Oral   Take 1 capsule by mouth daily before breakfast.   30 capsule   5   . OXYCODONE-ACETAMINOPHEN 5-325 MG PO TABS   Oral   Take 1-2 tablets by mouth every 4 (four) hours as needed for pain (moderate - severe pain).   40 tablet   0   . PRENATAL MULTIVITAMIN CH    Oral   Take 1 tablet by mouth daily.         Marland Kitchen RITONAVIR 100 MG PO TABS   Oral   Take 100 mg by mouth at bedtime. HIV medication.  Consult needed.         . SERTRALINE HCL 50 MG PO TABS   Oral   Take 1 tablet (50 mg total) by mouth daily.   30 tablet   11     BP 127/82  Pulse 119  Temp 103.1 F (39.5 C) (Oral)  Resp 16  SpO2 98%  Physical Exam  Nursing note and vitals reviewed. Constitutional: She is oriented to person, place, and time. She appears well-developed and well-nourished. No distress.  HENT:  Mouth/Throat: Oropharynx is clear and moist.       Nasal congestion, no sinus tenderness  Eyes: Conjunctivae normal are normal. No scleral icterus.  Neck: Neck supple. No tracheal deviation present.       No stiffness or rigidity  Cardiovascular: Regular rhythm, normal heart sounds and intact distal pulses.   Pulmonary/Chest: Effort normal and breath sounds normal. No respiratory distress. She has no rales.  Abdominal: Soft. Normal appearance and bowel sounds are normal. She exhibits no distension. There is no tenderness.  Genitourinary:       No cva tenderness  Musculoskeletal: She exhibits no edema and no tenderness.  Neurological: She is alert and oriented to person, place, and time.  Skin: Skin is warm and dry. No rash noted.  Psychiatric: She has a normal mood and affect.    ED Course  Procedures (including critical care time)  Dg Chest 2 View  04/04/2012  *RADIOLOGY REPORT*  Clinical Data: Cough, fever.  CHEST - 2 VIEW  Comparison: None.  Findings: Cardiomediastinal silhouette appears normal.  No acute pulmonary disease is noted.  The bony thorax is intact.  IMPRESSION: No acute cardiopulmonary abnormality seen.   Original Report Authenticated By: Lupita Raider.,  M.D.        MDM  Motrin po.  Symptoms/exam felt most c/w f/u. Symptoms began yesterday, will give rx w tamiflu.  No resp distress, pulse ox 98%, pt appears stable for d/c.   cxr neg.    Recheck hr 102, rr 16, pulse ox 99% ra.  Pt tolerating po fluids well.       Suzi Roots, MD 04/04/12 [phone]

## 2012-04-04 NOTE — ED Notes (Signed)
Dr. Denton Lank notified of patient's heart rate.

## 2012-04-04 NOTE — ED Notes (Signed)
Pt reports n/v, fever, productive cough, sore throat, generalized body aches since yesterday. Has taken tylenol and mucinex at home.

## 2012-04-04 NOTE — ED Notes (Signed)
Pt taking po fluid well apple juice and h2o

## 2012-04-05 ENCOUNTER — Ambulatory Visit (INDEPENDENT_AMBULATORY_CARE_PROVIDER_SITE_OTHER): Payer: Medicaid Other | Admitting: Internal Medicine

## 2012-04-05 ENCOUNTER — Encounter: Payer: Self-pay | Admitting: Internal Medicine

## 2012-04-05 VITALS — BP 116/76 | HR 103 | Temp 99.9°F | Ht 63.0 in | Wt 162.0 lb

## 2012-04-05 DIAGNOSIS — B20 Human immunodeficiency virus [HIV] disease: Secondary | ICD-10-CM

## 2012-04-05 DIAGNOSIS — A539 Syphilis, unspecified: Secondary | ICD-10-CM

## 2012-04-05 DIAGNOSIS — O98119 Syphilis complicating pregnancy, unspecified trimester: Secondary | ICD-10-CM

## 2012-04-05 NOTE — Assessment & Plan Note (Signed)
She has been treated and her last RPR is negative.

## 2012-04-05 NOTE — Assessment & Plan Note (Signed)
She continues to do well and will continue with same medication. She'll return in 4 months for routine followup. She knows to call sooner if she needs anything else

## 2012-04-05 NOTE — Progress Notes (Signed)
  Subjective:    Patient ID: Tonya Weeks, female    DOB: May 25, 1990, 22 y.o.   MRN: [id number]  HPI She comes in here for her routine followup. She is now postpartum and continues to do well. She does have URI symptoms and was seen in the emergency room and started on Tamiflu empirically. She otherwise continues to take her medicine with no missed doses.   Review of Systems  Constitutional: Positive for fever, activity change and fatigue. Negative for chills.  HENT: Positive for sore throat and trouble swallowing.   Respiratory: Positive for cough. Negative for shortness of breath and wheezing.   Cardiovascular: Negative for leg swelling.  Gastrointestinal: Negative for nausea, abdominal pain and diarrhea.  Musculoskeletal: Positive for myalgias. Negative for arthralgias.  Skin: Negative for rash.  Neurological: Negative for dizziness and headaches.  Hematological: Positive for adenopathy.       Cervical       Objective:   Physical Exam  Constitutional: She appears well-developed and well-nourished. No distress.  HENT:  Mouth/Throat: Oropharynx is clear and moist. No oropharyngeal exudate.  Neck:       About 1/2 cm left anterior cervical lymphadenopathy noted  Cardiovascular: Normal rate, regular rhythm and normal heart sounds.  Exam reveals no gallop and no friction rub.   No murmur heard. Pulmonary/Chest: Effort normal and breath sounds normal. No respiratory distress. She has no wheezes. She has no rales.  Lymphadenopathy:    She has cervical adenopathy.  Skin: No rash noted.          Assessment & Plan:

## 2012-04-21 ENCOUNTER — Other Ambulatory Visit: Payer: Self-pay | Admitting: Obstetrics & Gynecology

## 2012-04-21 DIAGNOSIS — O9089 Other complications of the puerperium, not elsewhere classified: Secondary | ICD-10-CM

## 2012-04-25 ENCOUNTER — Ambulatory Visit (HOSPITAL_COMMUNITY)
Admission: RE | Admit: 2012-04-25 | Discharge: 2012-04-25 | Disposition: A | Discharge status: Home / Self Care | Payer: Medicaid Other | Source: Ambulatory Visit | Attending: Obstetrics & Gynecology | Admitting: Obstetrics & Gynecology

## 2012-04-25 DIAGNOSIS — O9089 Other complications of the puerperium, not elsewhere classified: Secondary | ICD-10-CM

## 2012-06-24 ENCOUNTER — Telehealth: Payer: Self-pay | Admitting: *Deleted

## 2012-06-24 NOTE — Telephone Encounter (Signed)
Pt states she is on her second pack of Alyacen (ON 777) and in her first week- c/o BTB ( very light). Pt has no missed pills, is not on ATB therapy, and no change in partners.  Reviewed reasons for BTB on new start pills, also reviewed abnormal discharge and the difference between yeast symptoms and BV symptoms. Pt encouraged to continue her OCP and call back after her third cycle if she was still having bleeding issues. Pt agrees.

## 2012-06-30 ENCOUNTER — Encounter: Payer: Self-pay | Admitting: Obstetrics & Gynecology

## 2012-06-30 ENCOUNTER — Ambulatory Visit (INDEPENDENT_AMBULATORY_CARE_PROVIDER_SITE_OTHER): Payer: Medicaid Other | Admitting: Obstetrics & Gynecology

## 2012-06-30 VITALS — BP 147/102 | HR 93 | Temp 98.1°F | Ht 63.0 in | Wt 165.0 lb

## 2012-06-30 DIAGNOSIS — N949 Unspecified condition associated with female genital organs and menstrual cycle: Secondary | ICD-10-CM

## 2012-06-30 DIAGNOSIS — N920 Excessive and frequent menstruation with regular cycle: Secondary | ICD-10-CM

## 2012-06-30 DIAGNOSIS — Z309 Encounter for contraceptive management, unspecified: Secondary | ICD-10-CM | POA: Insufficient documentation

## 2012-06-30 LAB — POCT URINE PREGNANCY: Preg Test, Ur: NEGATIVE

## 2012-06-30 NOTE — Progress Notes (Signed)
Subjective:     Tonya Weeks is a 22 y.o. female here for a routine exam.  Current complaints: Pt states she is bleeding for about a week now. Pt states she started out bleeding extremely heavy. Pt states today her cycle is lighter. Pt is currently taking birth control pills which she started 2 months ago. Pt denies missing any pills.     Gynecologic History Patient's last menstrual period was 06/22/2012. Contraception: OCP (estrogen/progesterone) Last Pap: Jan 2014  Results were: Normal   Obstetric History OB History   Grav Para Term Preterm Abortions TAB SAB Ect Mult Living   4 1 1  2 2    1      # Outc Date GA Lbr Len/2nd Wgt Sex Del Anes PTL Lv   1 TAB 2010           2 TAB 2012           3 TRM 12/13 [redacted]w[redacted]d 05:40 / 03:29 8lb3.6oz(3.73kg) M SVD EPI  Yes   4 GRA                The following portions of the patient's history were reviewed and updated as appropriate: allergies, current medications, past family history, past medical history, past social history, past surgical history and problem list.  Review of Systems Pertinent items are noted in HPI.    Objective:     No exam performed today     Assessment:   Unscheduled bleed on COCP  Reassured Plan:  Return prn

## 2012-06-30 NOTE — Patient Instructions (Addendum)

## 2012-07-04 NOTE — Telephone Encounter (Signed)
error 

## 2012-07-05 ENCOUNTER — Telehealth: Payer: Self-pay | Admitting: *Deleted

## 2012-07-05 NOTE — Telephone Encounter (Signed)
Pt called office requesting a letter for work due to her cycles.  Left message to call back on voice mail.

## 2012-07-06 ENCOUNTER — Encounter: Payer: Self-pay | Admitting: *Deleted

## 2012-07-06 NOTE — Telephone Encounter (Signed)
Patient called back- 4:30 yesterday- left message to call back today.

## 2012-07-06 NOTE — Telephone Encounter (Signed)
Pt works at a job that she has limited access to bathroom- so she needs a note to be excused more frequently while her body is adjusting to her birth control pills. Letter composed and patient to pick up today.

## 2012-07-07 ENCOUNTER — Other Ambulatory Visit: Payer: Self-pay | Admitting: *Deleted

## 2012-07-07 DIAGNOSIS — B20 Human immunodeficiency virus [HIV] disease: Secondary | ICD-10-CM

## 2012-07-07 MED ORDER — RITONAVIR 100 MG PO TABS: 100.0000 mg | ORAL_TABLET | Freq: Every day | ORAL | Status: DC

## 2012-07-07 MED ORDER — ATAZANAVIR SULFATE 300 MG PO CAPS: 300.0000 mg | ORAL_CAPSULE | Freq: Every day | ORAL | Status: DC

## 2012-07-07 MED ORDER — EMTRICITABINE-TENOFOVIR DF 200-300 MG PO TABS: 1.0000 | ORAL_TABLET | Freq: Every day | ORAL | Status: DC

## 2012-07-11 ENCOUNTER — Encounter: Payer: Self-pay | Admitting: *Deleted

## 2012-07-21 ENCOUNTER — Other Ambulatory Visit: Payer: Self-pay

## 2012-07-25 ENCOUNTER — Ambulatory Visit (INDEPENDENT_AMBULATORY_CARE_PROVIDER_SITE_OTHER): Payer: Medicaid Other | Admitting: Internal Medicine

## 2012-07-25 ENCOUNTER — Encounter: Payer: Self-pay | Admitting: Internal Medicine

## 2012-07-25 VITALS — BP 149/103 | HR 89 | Temp 97.7°F | Wt 167.0 lb

## 2012-07-25 DIAGNOSIS — B20 Human immunodeficiency virus [HIV] disease: Secondary | ICD-10-CM

## 2012-07-25 NOTE — Progress Notes (Signed)
  Subjective:    Patient ID: Tonya Weeks, female    DOB: September 08, 1990, 22 y.o.   MRN: [id number]  HPI She comes in for follow up of her HIV.  She has been on Reyataz, norvir and [place] and delivered her baby at the end of 2013.  Unfortunately, her medicaid ran out (pregnancy) and now has been without mediciations for 1 month.  She did receive a supply today and is going to restart.  No new issues. Last labs with CD 4 over 500 and undetectable vl.  Doiing well post partum.     Review of Systems     Objective:   Physical Exam        Assessment & Plan:

## 2012-07-25 NOTE — Assessment & Plan Note (Signed)
Restarting meds today, labs in 3-4 weeks, follow up with me after.

## 2012-08-04 ENCOUNTER — Ambulatory Visit: Payer: Self-pay | Admitting: Internal Medicine

## 2012-08-08 ENCOUNTER — Ambulatory Visit: Payer: Self-pay

## 2012-08-08 ENCOUNTER — Encounter: Payer: Self-pay | Admitting: *Deleted

## 2012-08-08 DIAGNOSIS — F331 Major depressive disorder, recurrent, moderate: Secondary | ICD-10-CM

## 2012-08-08 NOTE — Progress Notes (Signed)
I met with Tonya Weeks today for the first time and she reports a history of depression, beginning in 2010, when her grandfather and uncle died.  Her mother got sick as well around this time and she had to move back to [place] from [place] when she did.  She found out she was HIV+ last year while pregnant and said she gets upset when she thinks about people living risky lives and not contracting HIV and yet she gets it.  She says she gets around 6 hours of sleep.  She reports normal appetite, but poor concentration, loss of interest, and some passive suicidal ideation with no plan or intent.  She has some mild anxiety.  I provided psycho-education on cognitive behavior therapy, explaining how negative distortions in our thinking influence our emotions.  She seemed to benefit from this.  Plan to meet again in 3 weeks.

## 2012-08-10 ENCOUNTER — Other Ambulatory Visit: Payer: Self-pay | Admitting: *Deleted

## 2012-08-10 DIAGNOSIS — B20 Human immunodeficiency virus [HIV] disease: Secondary | ICD-10-CM

## 2012-08-10 MED ORDER — EMTRICITABINE-TENOFOVIR DF 200-300 MG PO TABS: 1.0000 | ORAL_TABLET | Freq: Every day | ORAL | Status: DC

## 2012-08-10 MED ORDER — RITONAVIR 100 MG PO TABS: 100.0000 mg | ORAL_TABLET | Freq: Every day | ORAL | Status: DC

## 2012-08-10 MED ORDER — ATAZANAVIR SULFATE 300 MG PO CAPS: 300.0000 mg | ORAL_CAPSULE | Freq: Every day | ORAL | Status: DC

## 2012-08-23 ENCOUNTER — Other Ambulatory Visit: Payer: Self-pay

## 2012-08-23 DIAGNOSIS — B20 Human immunodeficiency virus [HIV] disease: Secondary | ICD-10-CM

## 2012-08-23 LAB — COMPLETE METABOLIC PANEL WITH GFR
AST: 14 U/L (ref 0–37)
Albumin: 4.1 g/dL (ref 3.5–5.2)
Alkaline Phosphatase: 105 U/L (ref 39–117)
Potassium: 4.3 mEq/L (ref 3.5–5.3)
Sodium: 138 mEq/L (ref 135–145)
Total Protein: 7.4 g/dL (ref 6.0–8.3)

## 2012-08-23 LAB — CBC WITH DIFFERENTIAL/PLATELET
Basophils Absolute: 0 10*3/uL (ref 0.0–0.1)
Basophils Relative: 0 % (ref 0–1)
Eosinophils Relative: 1 % (ref 0–5)
HCT: 31.1 % — ABNORMAL LOW (ref 36.0–46.0)
Lymphocytes Relative: 39 % (ref 12–46)
MCHC: 32.5 g/dL (ref 30.0–36.0)
MCV: 67.8 fL — ABNORMAL LOW (ref 78.0–100.0)
Monocytes Absolute: 0.4 10*3/uL (ref 0.1–1.0)
RDW: 17.2 % — ABNORMAL HIGH (ref 11.5–15.5)

## 2012-08-24 LAB — HIV-1 RNA QUANT-NO REFLEX-BLD: HIV 1 RNA Quant: <20 copies/mL (ref <20)

## 2012-08-29 ENCOUNTER — Ambulatory Visit: Payer: Self-pay

## 2012-08-29 DIAGNOSIS — F330 Major depressive disorder, recurrent, mild: Secondary | ICD-10-CM

## 2012-08-29 NOTE — Progress Notes (Signed)
Tonya Weeks came in today for the second time and reported ongoing depressed mood at a level of 5-6 on a 10 pt scale.  She also reports a "numb feeling" sometimes when she does impulsive things, such as taking money from her mother without asking, then spending it, knowing she shouldn't.  At times this sounded like dissociative episodes, but it was unclear.  She reports no history of physical, emotional, or sexual trauma, but does seem to have been traumatized by the series of events in her life, starting when she was 5, including the deaths of her grandfather, uncle, and then grandmother, followed by her mother's illness and then her own diagnosis of HIV.  She said she wakes up crying in her sleep sometimes.  She also describes severe mood swings, getting angry at "little things", which she feels foolish about later.  I provided some psycho-education on breathing techniques to calm down.  Also provided some grief counseling about her grandparents and uncle.  Plan to meet again in 2 weeks.

## 2012-08-30 NOTE — Progress Notes (Signed)
Patient ID: Tonya Weeks, female   DOB: January 26, 1991, 22 y.o.   MRN: [id number] THP CM Artis Delay

## 2012-09-05 ENCOUNTER — Ambulatory Visit (INDEPENDENT_AMBULATORY_CARE_PROVIDER_SITE_OTHER): Payer: Medicaid Other | Admitting: Internal Medicine

## 2012-09-05 ENCOUNTER — Emergency Department (HOSPITAL_COMMUNITY)
Admission: EM | Admit: 2012-09-05 | Discharge: 2012-09-05 | Disposition: A | Discharge status: Home / Self Care | Payer: Self-pay | Attending: Emergency Medicine | Admitting: Emergency Medicine

## 2012-09-05 ENCOUNTER — Encounter (HOSPITAL_COMMUNITY): Payer: Self-pay | Admitting: Emergency Medicine

## 2012-09-05 ENCOUNTER — Encounter: Payer: Self-pay | Admitting: Internal Medicine

## 2012-09-05 VITALS — BP 131/93 | HR 101 | Temp 98.6°F | Wt 162.8 lb

## 2012-09-05 DIAGNOSIS — Z79899 Other long term (current) drug therapy: Secondary | ICD-10-CM | POA: Insufficient documentation

## 2012-09-05 DIAGNOSIS — F32A Depression, unspecified: Secondary | ICD-10-CM | POA: Insufficient documentation

## 2012-09-05 DIAGNOSIS — Z8619 Personal history of other infectious and parasitic diseases: Secondary | ICD-10-CM | POA: Insufficient documentation

## 2012-09-05 DIAGNOSIS — Z21 Asymptomatic human immunodeficiency virus [HIV] infection status: Secondary | ICD-10-CM | POA: Insufficient documentation

## 2012-09-05 DIAGNOSIS — Z862 Personal history of diseases of the blood and blood-forming organs and certain disorders involving the immune mechanism: Secondary | ICD-10-CM | POA: Insufficient documentation

## 2012-09-05 DIAGNOSIS — B20 Human immunodeficiency virus [HIV] disease: Secondary | ICD-10-CM

## 2012-09-05 DIAGNOSIS — N920 Excessive and frequent menstruation with regular cycle: Secondary | ICD-10-CM | POA: Insufficient documentation

## 2012-09-05 DIAGNOSIS — F329 Major depressive disorder, single episode, unspecified: Secondary | ICD-10-CM

## 2012-09-05 DIAGNOSIS — Z113 Encounter for screening for infections with a predominantly sexual mode of transmission: Secondary | ICD-10-CM

## 2012-09-05 NOTE — ED Notes (Signed)
Pt presenting to ed with c/o vaginal bleeding with positive dizziness, lightheadedness pt denies nausea and vomiting at this time pt states bleeding x 3 days

## 2012-09-05 NOTE — Assessment & Plan Note (Signed)
She is getting counseling and is pleased with her medication and her time with her counselor. She will continue.

## 2012-09-05 NOTE — Progress Notes (Signed)
  Subjective:    Patient ID: Tonya Weeks, female    DOB: 1990/05/27, 22 y.o.   MRN: [id number]  HPI She comes in for followup of HIV. She was started last year on a regimen of rate hence, Norvir and Truvada when she was found to be positive during pregnancy. After delivering her healthy baby, she lost her Medicaid and therefore lost her medications. She now is on the drug assistance program and restarted her medications one month ago. After restarting, she is now still undetectable with a CD4 count of 700. She feels well and denies any missed doses. She is pleased with the medication and been back on it. She is having some heavy menstrual periods and is scheduling to see her gynecologist. She denies any headaches or vision changes. No weight loss or diarrhea   Review of Systems  Constitutional: Negative for fatigue and unexpected weight change.  HENT: Negative for sore throat and trouble swallowing.   Cardiovascular: Negative for leg swelling.  Gastrointestinal: Negative for nausea and diarrhea.  Genitourinary: Positive for menstrual problem.  Skin: Negative for rash.  Neurological: Positive for light-headedness. Negative for dizziness.       Objective:   Physical Exam  Constitutional: She appears well-developed and well-nourished. No distress.  HENT:  Mouth/Throat: No oropharyngeal exudate.  Cardiovascular: Normal rate, regular rhythm and normal heart sounds.   No murmur heard. Pulmonary/Chest: Effort normal and breath sounds normal. No respiratory distress. She has no wheezes.  Skin: No rash noted.          Assessment & Plan:

## 2012-09-05 NOTE — Assessment & Plan Note (Signed)
She is doing well back on her regimen. I will have her return in 3 months. She will call if she has any problems getting into her gynecologist.

## 2012-09-05 NOTE — ED Provider Notes (Signed)
History     CSN: [id number]  Arrival date & time 09/05/12  1605   First MD Initiated Contact with Patient 09/05/12 1715      Chief Complaint  Patient presents with  . Vaginal Bleeding    (Consider location/radiation/quality/duration/timing/severity/associated sxs/prior treatment) HPI Comments: 22 year old female with a past medical history of HIV, sickle cell and syphilis since the emergency department complaining of heavier menstrual cycles are normal. Patient states her menstrual cycle began 4 days ago, and 3 days ago became heavy. This is her first menstrual cycle off of birth control pills. She thought that coming off birth control pills her periods would become lighter, however it has become heavier. She has an appointment to see her gynecologist on Wednesday. Admits to "normal menstrual cramps", however denies abdominal pain. No nausea or vomiting. Despite triage summary, patient denies dizziness and lightheadedness. Prior to onset of menses, denies vaginal discharge, pain or urinary symptoms. Earlier today she was at her infectious disease specialists office and was told her blood pressure was a little high. Blood pressure in the emergency department 154/104. Admits to being under a great amount of stress at this time.  Patient is a 22 y.o. female presenting with vaginal bleeding. The history is provided by the patient.  Vaginal Bleeding Associated symptoms: no abdominal pain, no back pain, no dizziness, no fever, no nausea and no vaginal discharge     Past Medical History  Diagnosis Date  . HIV infection   . HIV (human immunodeficiency virus infection)   . Sickle cell trait   . Syphilis     Past Surgical History  Procedure Laterality Date  . Dilation and curettage, diagnostic / therapeutic      Family History  Problem Relation Age of Onset  . Lupus Mother   . Hypertension Mother   . Heart failure Mother   . Heart attack Mother   . Sickle cell trait Mother   .  Scleroderma Mother   . Other Neg Hx   . Hypertension Father   . Arthritis Maternal Grandmother   . Diabetes Maternal Grandmother   . Hypertension Maternal Grandmother   . Stroke Maternal Grandmother   . Heart failure Maternal Grandmother   . Heart attack Maternal Grandmother   . Asthma Maternal Grandmother   . Arthritis Maternal Grandfather   . Diabetes Maternal Grandfather   . Hypertension Maternal Grandfather   . Stroke Maternal Grandfather   . Heart failure Maternal Grandfather   . Heart attack Maternal Grandfather   . Kidney disease Maternal Grandfather     History  Substance Use Topics  . Smoking status: Never Smoker   . Smokeless tobacco: Never Used  . Alcohol Use: Yes     Comment: Socially     OB History   Grav Para Term Preterm Abortions TAB SAB Ect Mult Living   4 1 1  2 2    1       Review of Systems  Constitutional: Negative for fever and chills.  Respiratory: Negative for shortness of breath.   Cardiovascular: Negative for chest pain.  Gastrointestinal: Negative for nausea, vomiting and abdominal pain.  Genitourinary: Positive for vaginal bleeding and menstrual problem. Negative for vaginal discharge and pelvic pain.  Musculoskeletal: Negative for back pain.  Neurological: Negative for dizziness and light-headedness.  All other systems reviewed and are negative.    Allergies  Review of patient's allergies indicates no known allergies.  Home Medications   Current Outpatient Rx  Name  Route  Sig  Dispense  Refill  . atazanavir (REYATAZ) 300 MG capsule   Oral   Take 1 capsule (300 mg total) by mouth at bedtime.   30 capsule   5   . emtricitabine-tenofovir (TRUVADA) 200-300 MG per tablet   Oral   Take 1 tablet by mouth at bedtime.   30 tablet   5   . ibuprofen (ADVIL,MOTRIN) 600 MG tablet   Oral   Take 1 tablet (600 mg total) by mouth every 6 (six) hours as needed for pain.   30 tablet   5   . oxyCODONE-acetaminophen (PERCOCET/ROXICET)  5-325 MG per tablet   Oral   Take 1-2 tablets by mouth every 4 (four) hours as needed for pain (moderate - severe pain).   40 tablet   0   . ritonavir (NORVIR) 100 MG TABS   Oral   Take 1 tablet (100 mg total) by mouth at bedtime.   30 tablet   5   . sertraline (ZOLOFT) 50 MG tablet   Oral   Take 1 tablet (50 mg total) by mouth daily.   30 tablet   11   . norethindrone-ethinyl estradiol 1/35 (ALAYCEN 1/35) tablet   Oral   Take 1 tablet by mouth daily.           BP 154/104  Pulse 106  Temp(Src) 98.5 F (36.9 C) (Oral)  Resp 20  SpO2 100%  LMP 08/31/2012  Physical Exam  Constitutional: She is oriented to person, place, and time. She appears well-developed and well-nourished. No distress.  HENT:  Head: Normocephalic and atraumatic.  Mouth/Throat: Oropharynx is clear and moist.  Eyes: Conjunctivae are normal.  Neck: Normal range of motion. Neck supple.  Cardiovascular: Normal rate, regular rhythm, normal heart sounds and intact distal pulses.   Pulmonary/Chest: Effort normal and breath sounds normal.  Abdominal: Soft. Bowel sounds are normal. She exhibits no distension and no mass. There is no tenderness. There is no rebound and no guarding.  Musculoskeletal: Normal range of motion. She exhibits no edema.  Neurological: She is alert and oriented to person, place, and time.  Skin: Skin is warm and dry. She is not diaphoretic. No pallor.  Psychiatric: She has a normal mood and affect. Her behavior is normal.    ED Course  Procedures (including critical care time)  Labs Reviewed - No data to display No results found.   1. Menorrhagia       MDM  22 year old female with menorrhagia. This is her first menstrual cycle being off of birth control pills. She has a followup appointment the gynecologist on Wednesday. Advised her to discuss this with her gynecologist. She is in no apparent distress. Blood pressure elevated at 154/104. Admits to being under increased  stress. Advised her to discuss this with her primary care physician, try to manage her stress levels, increase exercise, decrease caffeine intake and watch diet. Close return precautions discussed. Patient states her understanding of this plan and is agreeable.   Trevor Mace, PA-C 09/05/12 1800

## 2012-09-07 ENCOUNTER — Ambulatory Visit: Payer: Self-pay | Admitting: Obstetrics & Gynecology

## 2012-09-08 ENCOUNTER — Encounter: Payer: Self-pay | Admitting: Obstetrics

## 2012-09-08 NOTE — ED Provider Notes (Signed)
Medical screening examination/treatment/procedure(s) were performed by non-physician practitioner and as supervising physician I was immediately available for consultation/collaboration.  Raeford Razor, MD 09/08/12 [phone]

## 2012-09-12 ENCOUNTER — Ambulatory Visit: Payer: Self-pay

## 2012-09-12 DIAGNOSIS — F411 Generalized anxiety disorder: Secondary | ICD-10-CM

## 2012-09-12 DIAGNOSIS — F330 Major depressive disorder, recurrent, mild: Secondary | ICD-10-CM
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
## 2012-09-26 ENCOUNTER — Ambulatory Visit: Payer: Self-pay

## 2012-10-17 ENCOUNTER — Ambulatory Visit: Payer: Self-pay

## 2013-01-30 ENCOUNTER — Other Ambulatory Visit (INDEPENDENT_AMBULATORY_CARE_PROVIDER_SITE_OTHER): Payer: Medicaid Other

## 2013-01-30 DIAGNOSIS — Z79899 Other long term (current) drug therapy: Secondary | ICD-10-CM

## 2013-01-30 DIAGNOSIS — B20 Human immunodeficiency virus [HIV] disease: Secondary | ICD-10-CM

## 2013-01-30 DIAGNOSIS — Z113 Encounter for screening for infections with a predominantly sexual mode of transmission: Secondary | ICD-10-CM

## 2013-01-30 LAB — CBC WITH DIFFERENTIAL/PLATELET
Basophils Relative: 0 % (ref 0–1)
Eosinophils Absolute: 0.1 10*3/uL (ref 0.0–0.7)
Hemoglobin: 7.9 g/dL — ABNORMAL LOW (ref 12.0–15.0)
Lymphs Abs: 2.6 10*3/uL (ref 0.7–4.0)
MCH: 17.9 pg — ABNORMAL LOW (ref 26.0–34.0)
MCHC: 29.6 g/dL — ABNORMAL LOW (ref 30.0–36.0)
Monocytes Relative: 9 % (ref 3–12)
Neutro Abs: 2.5 10*3/uL (ref 1.7–7.7)
Neutrophils Relative %: 44 % (ref 43–77)
Platelets: 449 10*3/uL — ABNORMAL HIGH (ref 150–400)
RBC: 4.42 MIL/uL (ref 3.87–5.11)

## 2013-01-30 LAB — COMPLETE METABOLIC PANEL WITH GFR
CO2: 23 mEq/L (ref 19–32)
Calcium: 9.4 mg/dL (ref 8.4–10.5)
Chloride: 105 mEq/L (ref 96–112)
Creat: 0.65 mg/dL (ref 0.50–1.10)
GFR, Est African American: >89 mL/min
GFR, Est Non African American: >89 mL/min
Glucose, Bld: 94 mg/dL (ref 70–99)
Total Bilirubin: 0.2 mg/dL — ABNORMAL LOW (ref 0.3–1.2)

## 2013-01-30 LAB — LIPID PANEL
Cholesterol: 104 mg/dL (ref 0–200)
HDL: 34 mg/dL — ABNORMAL LOW (ref >39)
Total CHOL/HDL Ratio: 3.1 Ratio
Triglycerides: 134 mg/dL (ref <150)
VLDL: 27 mg/dL (ref 0–40)

## 2013-01-30 LAB — RPR: RPR Ser Ql: REACTIVE — AB

## 2013-01-31 LAB — T-HELPER CELL (CD4) - (RCID CLINIC ONLY): CD4 T Cell Abs: 680 /uL (ref 400–2700)

## 2013-01-31 LAB — T.PALLIDUM AB, TOTAL: T pallidum Antibodies (TP-PA): >8 S/CO — ABNORMAL HIGH (ref <0.90)

## 2013-02-08 ENCOUNTER — Emergency Department (HOSPITAL_BASED_OUTPATIENT_CLINIC_OR_DEPARTMENT_OTHER)
Admission: EM | Admit: 2013-02-08 | Discharge: 2013-02-08 | Disposition: A | Discharge status: Home / Self Care | Payer: Medicaid Other | Attending: Emergency Medicine | Admitting: Emergency Medicine

## 2013-02-08 ENCOUNTER — Encounter (HOSPITAL_BASED_OUTPATIENT_CLINIC_OR_DEPARTMENT_OTHER): Payer: Self-pay | Admitting: Emergency Medicine

## 2013-02-08 DIAGNOSIS — Z79899 Other long term (current) drug therapy: Secondary | ICD-10-CM | POA: Insufficient documentation

## 2013-02-08 DIAGNOSIS — A599 Trichomoniasis, unspecified: Secondary | ICD-10-CM | POA: Insufficient documentation

## 2013-02-08 DIAGNOSIS — Z862 Personal history of diseases of the blood and blood-forming organs and certain disorders involving the immune mechanism: Secondary | ICD-10-CM | POA: Insufficient documentation

## 2013-02-08 DIAGNOSIS — Z21 Asymptomatic human immunodeficiency virus [HIV] infection status: Secondary | ICD-10-CM | POA: Insufficient documentation

## 2013-02-08 DIAGNOSIS — A6000 Herpesviral infection of urogenital system, unspecified: Secondary | ICD-10-CM

## 2013-02-08 DIAGNOSIS — Z3202 Encounter for pregnancy test, result negative: Secondary | ICD-10-CM | POA: Insufficient documentation

## 2013-02-08 DIAGNOSIS — N390 Urinary tract infection, site not specified: Secondary | ICD-10-CM | POA: Insufficient documentation

## 2013-02-08 LAB — WET PREP, GENITAL
Clue Cells Wet Prep HPF POC: NONE SEEN
Yeast Wet Prep HPF POC: NONE SEEN

## 2013-02-08 LAB — URINALYSIS, ROUTINE W REFLEX MICROSCOPIC
Bilirubin Urine: NEGATIVE
Hgb urine dipstick: NEGATIVE
Ketones, ur: NEGATIVE mg/dL
Nitrite: NEGATIVE
Protein, ur: NEGATIVE mg/dL
pH: 6 (ref 5.0–8.0)

## 2013-02-08 LAB — URINE MICROSCOPIC-ADD ON

## 2013-02-08 MED ORDER — CEPHALEXIN 500 MG PO CAPS: 500.0000 mg | ORAL_CAPSULE | Freq: Four times a day (QID) | ORAL | Status: DC

## 2013-02-08 MED ORDER — ACYCLOVIR 400 MG PO TABS: 400.0000 mg | ORAL_TABLET | Freq: Three times a day (TID) | ORAL | Status: DC

## 2013-02-08 MED ORDER — METRONIDAZOLE 500 MG PO TABS
2000.0000 mg | ORAL_TABLET | Freq: Once | ORAL | Status: AC
  Administered 2013-02-08: 2000 mg via ORAL
  Filled 2013-02-08: qty 4

## 2013-02-08 NOTE — ED Provider Notes (Signed)
CSN: [id number]     Arrival date & time 02/08/13  1257 History   First MD Initiated Contact with Patient 02/08/13 1310     Chief Complaint  Patient presents with  . Vaginal Discharge   (Consider location/radiation/quality/duration/timing/severity/associated sxs/prior Treatment) HPI Comments: Pt states that she has a lot of pain in her vaginal area:pt denies fever:pt states that he is followed by id and has been doing well  Patient is a 22 y.o. female presenting with vaginal discharge. The history is provided by the patient. No language interpreter was used.  Vaginal Discharge Quality:  [place] Severity:  Moderate Onset quality:  Gradual Duration:  1 week Timing:  Constant Progression:  Worsening Chronicity:  New Relieved by:  Nothing   Past Medical History  Diagnosis Date  . HIV infection   . HIV (human immunodeficiency virus infection)   . Sickle cell trait   . Syphilis    Past Surgical History  Procedure Laterality Date  . Dilation and curettage, diagnostic / therapeutic     Family History  Problem Relation Age of Onset  . Lupus Mother   . Hypertension Mother   . Heart failure Mother   . Heart attack Mother   . Sickle cell trait Mother   . Scleroderma Mother   . Other Neg Hx   . Hypertension Father   . Arthritis Maternal Grandmother   . Diabetes Maternal Grandmother   . Hypertension Maternal Grandmother   . Stroke Maternal Grandmother   . Heart failure Maternal Grandmother   . Heart attack Maternal Grandmother   . Asthma Maternal Grandmother   . Arthritis Maternal Grandfather   . Diabetes Maternal Grandfather   . Hypertension Maternal Grandfather   . Stroke Maternal Grandfather   . Heart failure Maternal Grandfather   . Heart attack Maternal Grandfather   . Kidney disease Maternal Grandfather    History  Substance Use Topics  . Smoking status: Never Smoker   . Smokeless tobacco: Never Used  . Alcohol Use: Yes     Comment: Socially    OB History   Grav Para Term Preterm Abortions TAB SAB Ect Mult Living   4 1 1  2 2    1      Review of Systems  Respiratory: Negative.   Cardiovascular: Negative.   Genitourinary: Positive for vaginal discharge.    Allergies  Review of patient's allergies indicates no known allergies.  Home Medications   Current Outpatient Rx  Name  Route  Sig  Dispense  Refill  . atazanavir (REYATAZ) 300 MG capsule   Oral   Take 1 capsule (300 mg total) by mouth at bedtime.   30 capsule   5   . emtricitabine-tenofovir (TRUVADA) 200-300 MG per tablet   Oral   Take 1 tablet by mouth at bedtime.   30 tablet   5   . ibuprofen (ADVIL,MOTRIN) 600 MG tablet   Oral   Take 1 tablet (600 mg total) by mouth every 6 (six) hours as needed for pain.   30 tablet   5   . norethindrone-ethinyl estradiol 1/35 (ALAYCEN 1/35) tablet   Oral   Take 1 tablet by mouth daily.         Marland Kitchen oxyCODONE-acetaminophen (PERCOCET/ROXICET) 5-325 MG per tablet   Oral   Take 1-2 tablets by mouth every 4 (four) hours as needed for pain (moderate - severe pain).   40 tablet   0   . ritonavir (NORVIR) 100 MG TABS   Oral  Take 1 tablet (100 mg total) by mouth at bedtime.   30 tablet   5   . sertraline (ZOLOFT) 50 MG tablet   Oral   Take 1 tablet (50 mg total) by mouth daily.   30 tablet   11    BP 146/87  Pulse 86  Temp(Src) 98.2 F (36.8 C) (Oral)  Resp 16  Ht 5\' 3"  (1.6 m)  Wt 162 lb (73.483 kg)  BMI 28.70 kg/m2  SpO2 100%  LMP 01/28/2013 Physical Exam  Nursing note and vitals reviewed. Constitutional: She is oriented to person, place, and time. She appears well-developed and well-nourished.  Cardiovascular: Normal rate and regular rhythm.   Pulmonary/Chest: Effort normal and breath sounds normal.  Abdominal: Soft. Bowel sounds are normal. There is no tenderness.  Genitourinary:  Yellow discharge:pt has multiple indurated areas to the left labia minor  Neurological: She is alert and oriented to person,  place, and time.  Skin: Skin is warm and dry.    ED Course  Procedures (including critical care time) Labs Review Labs Reviewed  WET PREP, GENITAL - Abnormal; Notable for the following:    Trich, Wet Prep MANY (*)    WBC, Wet Prep HPF POC FEW (*)    All other components within normal limits  URINALYSIS, ROUTINE W REFLEX MICROSCOPIC - Abnormal; Notable for the following:    APPearance CLOUDY (*)    Leukocytes, UA LARGE (*)    All other components within normal limits  URINE MICROSCOPIC-ADD ON - Abnormal; Notable for the following:    Bacteria, UA MANY (*)    All other components within normal limits  URINE CULTURE  GC/CHLAMYDIA PROBE AMP  HERPES SIMPLEX VIRUS CULTURE  PREGNANCY, URINE  RPR   Imaging Review No results found.  EKG Interpretation   None       MDM   1. Trichimoniasis   2. Herpes genitalia   3. UTI (lower urinary tract infection)    Discussed with pt:pt has appointment with ID doctor in 5 days:discussed safe sex practices    Teressa Lower, NP 02/08/13 1424

## 2013-02-08 NOTE — ED Notes (Signed)
Pt c/o lower abd pain with vaginal discharge x 1 week

## 2013-02-08 NOTE — ED Provider Notes (Signed)
Medical screening examination/treatment/procedure(s) were performed by non-physician practitioner and as supervising physician I was immediately available for consultation/collaboration.  EKG Interpretation   None         Audree Camel, MD 02/08/13 1447

## 2013-02-09 LAB — URINE CULTURE: Culture: NO GROWTH

## 2013-02-09 LAB — GC/CHLAMYDIA PROBE AMP: GC Probe RNA: NEGATIVE

## 2013-02-10 ENCOUNTER — Telehealth (HOSPITAL_COMMUNITY): Payer: Self-pay | Admitting: Emergency Medicine

## 2013-02-10 NOTE — ED Notes (Signed)
+  Chlamydia. Chart sent to EDP office for review. [facility] attached. 

## 2013-02-10 NOTE — ED Notes (Signed)
Patient has +Chlamydia. 

## 2013-02-11 LAB — HERPES SIMPLEX VIRUS CULTURE
Culture: DETECTED
Special Requests: NORMAL

## 2013-02-13 ENCOUNTER — Ambulatory Visit (INDEPENDENT_AMBULATORY_CARE_PROVIDER_SITE_OTHER): Payer: Medicaid Other | Admitting: Internal Medicine

## 2013-02-13 ENCOUNTER — Encounter: Payer: Self-pay | Admitting: Internal Medicine

## 2013-02-13 VITALS — BP 142/98 | HR 91 | Temp 97.9°F | Ht 63.0 in | Wt 163.0 lb

## 2013-02-13 DIAGNOSIS — A749 Chlamydial infection, unspecified: Secondary | ICD-10-CM | POA: Insufficient documentation

## 2013-02-13 DIAGNOSIS — Z113 Encounter for screening for infections with a predominantly sexual mode of transmission: Secondary | ICD-10-CM

## 2013-02-13 DIAGNOSIS — B20 Human immunodeficiency virus [HIV] disease: Secondary | ICD-10-CM

## 2013-02-13 DIAGNOSIS — Z202 Contact with and (suspected) exposure to infections with a predominantly sexual mode of transmission: Secondary | ICD-10-CM

## 2013-02-13 DIAGNOSIS — Z23 Encounter for immunization: Secondary | ICD-10-CM

## 2013-02-13 DIAGNOSIS — B009 Herpesviral infection, unspecified: Secondary | ICD-10-CM | POA: Insufficient documentation

## 2013-02-13 MED ORDER — AZITHROMYCIN 500 MG PO TABS: 1000.0000 mg | ORAL_TABLET | Freq: Every day | ORAL | Status: DC

## 2013-02-13 MED ORDER — VALACYCLOVIR HCL 1 G PO TABS: 1000.0000 mg | ORAL_TABLET | Freq: Two times a day (BID) | ORAL | Status: DC

## 2013-02-13 NOTE — ED Notes (Signed)
Patient informed of positive results after id'd x 2 and informed of need to notify partner to be treated.Patient treated by PCP she will call us  With the name of the medication she was treated with.

## 2013-02-13 NOTE — Assessment & Plan Note (Signed)
Titer 1:16.  Will recheck with next labs to be sure it is not again elevating

## 2013-02-13 NOTE — Assessment & Plan Note (Signed)
Doing well with current therapy.  RTC in 3 months.  Reminded to use condoms if she bbecomes sexually active.

## 2013-02-13 NOTE — ED Notes (Signed)
Chart returned from EDP office. Per Santiago Glad PA-C, Azithromycin 500 mg. Take 2 tablets (1000 mg total). #2.

## 2013-02-13 NOTE — Progress Notes (Signed)
  Subjective:    Patient ID: Tonya Weeks, female    DOB: 09/26/1990, 22 y.o.   MRN: [id number]  HPI  She comes in for followup of HIV. She was started last year on a regimen of Reyataz, Norvir and Truvada when she was found to be positive during pregnancy. After delivering her healthy baby, she lost her Medicaid and therefore lost her medications. She now is on the drug assistance program and restarted her medications in May 2014. She feels well and denies any missed doses. CD4 is good with a minimally detectable viral load at 41.  She was recently in th ED for vaginal discharge and was positive for chlamydia, HSV and trichomonas.  RPR also checked and was stable (previously treated).  She denies any headaches or vision changes. No weight loss or diarrhea.  She has received treatment for trich and HSV.     Review of Systems  Constitutional: Negative for fatigue and unexpected weight change.  HENT: Negative for sore throat and trouble swallowing.   Eyes: Negative for visual disturbance.  Cardiovascular: Negative for leg swelling.  Gastrointestinal: Negative for nausea and diarrhea.  Endocrine: Negative for polyuria.  Genitourinary: Positive for genital sores. Negative for menstrual problem.  Musculoskeletal: Negative for back pain.  Skin: Negative for rash.  Neurological: Negative for dizziness and light-headedness.  Hematological: Negative for adenopathy.  Psychiatric/Behavioral: Negative for dysphoric mood.       Objective:   Physical Exam  Constitutional: She is oriented to person, place, and time. She appears well-developed and well-nourished. No distress.  HENT:  Mouth/Throat: No oropharyngeal exudate.  Eyes: Right eye exhibits no discharge. Left eye exhibits no discharge. No scleral icterus.  Cardiovascular: Normal rate, regular rhythm and normal heart sounds.   No murmur heard. Pulmonary/Chest: Effort normal and breath sounds normal. No respiratory distress. She has no wheezes.   Lymphadenopathy:    She has no cervical adenopathy.  Neurological: She is alert and oriented to person, place, and time.  Skin: No rash noted.  Psychiatric: She has a normal mood and affect. Her behavior is normal.          Assessment & Plan:

## 2013-02-13 NOTE — Assessment & Plan Note (Signed)
Resolved.  Got acyclvir from ED.  Will prescribe PRN Valtrex for future outbreaks.

## 2013-02-13 NOTE — Assessment & Plan Note (Addendum)
Patient prescribed Azithromycin today and will pick it up after visit.   counselled on condom use.

## 2013-03-06 ENCOUNTER — Ambulatory Visit: Payer: Medicaid Other

## 2013-03-06 DIAGNOSIS — F331 Major depressive disorder, recurrent, moderate: Secondary | ICD-10-CM
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
## 2013-04-13 ENCOUNTER — Ambulatory Visit: Payer: Medicaid Other

## 2013-04-13 DIAGNOSIS — F330 Major depressive disorder, recurrent, mild: Secondary | ICD-10-CM

## 2013-04-13 DIAGNOSIS — F411 Generalized anxiety disorder: Secondary | ICD-10-CM
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
## 2013-04-24 ENCOUNTER — Ambulatory Visit: Payer: Medicaid Other

## 2013-04-24 DIAGNOSIS — F411 Generalized anxiety disorder: Secondary | ICD-10-CM

## 2013-04-24 DIAGNOSIS — F330 Major depressive disorder, recurrent, mild: Secondary | ICD-10-CM

## 2013-04-24 NOTE — Progress Notes (Signed)
Tonya Weeks reported that things are some better now that she and her mother were able to come in and talk with someone outside the family.  She said they both have a ways to go, but it was "a start" for them to get along better and understand each other better.  She talked about how she has accepted her HIV status and how devastated she was when she first found out about it.  She said now she worries about her friends and wants to encourage them to get tested, but isn't ready to tell them that she is positive.  Overall, she seems to be doing better.

## 2013-04-28 NOTE — Progress Notes (Unsigned)
Patient ID: Tonya Weeks, female   DOB: 11/28/1990, 23 y.o.   MRN: [id number] Ct. [facility] due to successfully completing all treatment plan goals.

## 2013-05-15 ENCOUNTER — Ambulatory Visit: Payer: Medicaid Other

## 2013-05-15 ENCOUNTER — Other Ambulatory Visit: Payer: Self-pay | Admitting: Internal Medicine

## 2013-05-15 ENCOUNTER — Other Ambulatory Visit (INDEPENDENT_AMBULATORY_CARE_PROVIDER_SITE_OTHER): Payer: Medicaid Other

## 2013-05-15 DIAGNOSIS — Z113 Encounter for screening for infections with a predominantly sexual mode of transmission: Secondary | ICD-10-CM

## 2013-05-15 DIAGNOSIS — B20 Human immunodeficiency virus [HIV] disease: Secondary | ICD-10-CM

## 2013-05-16 ENCOUNTER — Other Ambulatory Visit: Payer: Self-pay | Admitting: Internal Medicine

## 2013-05-16 LAB — T-HELPER CELL (CD4) - (RCID CLINIC ONLY)
CD4 % Helper T Cell: 20 % — ABNORMAL LOW (ref 33–55)
CD4 T Cell Abs: 440 /uL (ref 400–2700)

## 2013-05-16 LAB — RPR TITER

## 2013-05-16 LAB — URINE CYTOLOGY ANCILLARY ONLY
CHLAMYDIA, DNA PROBE: POSITIVE — AB
Neisseria Gonorrhea: NEGATIVE

## 2013-05-16 LAB — RPR: RPR Ser Ql: REACTIVE — AB

## 2013-05-16 MED ORDER — AZITHROMYCIN 500 MG PO TABS: 1000.0000 mg | ORAL_TABLET | Freq: Every day | ORAL | Status: DC

## 2013-05-17 ENCOUNTER — Other Ambulatory Visit: Payer: Self-pay | Admitting: Internal Medicine

## 2013-05-17 DIAGNOSIS — B20 Human immunodeficiency virus [HIV] disease: Secondary | ICD-10-CM

## 2013-05-17 LAB — T.PALLIDUM AB, TOTAL: T pallidum Antibodies (TP-PA): >8 S/CO — ABNORMAL HIGH (ref <0.90)

## 2013-05-17 LAB — HIV-1 RNA QUANT-NO REFLEX-BLD
HIV 1 RNA Quant: 1088 copies/mL — ABNORMAL HIGH (ref <20)
HIV-1 RNA Quant, Log: 3.04 {Log} — ABNORMAL HIGH (ref <1.30)

## 2013-05-17 NOTE — Addendum Note (Signed)
Addended by: Mariea ClontsGREEN, [place]acl[place] Acklin D on: 05/17/2013 03:15 PM   Modules accepted: Orders

## 2013-05-17 NOTE — Progress Notes (Signed)
Notified patient and she stated that she has not been sexually active with anyone since she was treated last time. She will pick up the Rx today and I advised her I would let Dr. Luciana Axeomer know and he can address her concerns at follow up appt on 06/01/13.

## 2013-05-24 ENCOUNTER — Encounter: Payer: Self-pay | Admitting: *Deleted

## 2013-05-25 LAB — HIV-1 GENOTYPR PLUS

## 2013-06-01 ENCOUNTER — Ambulatory Visit: Payer: Self-pay | Admitting: Internal Medicine

## 2013-06-02 ENCOUNTER — Telehealth: Payer: Self-pay | Admitting: *Deleted

## 2013-06-02 NOTE — Telephone Encounter (Signed)
Patient missed appointment 3/5.  She stated she pressed the number to cancel her appointment on the automated phone-tree call, but her appointment was not cancelled.  Pt rescheduled for 4/21. Andree CossHowell, Michelle M, RN

## 2013-06-12 ENCOUNTER — Ambulatory Visit: Payer: Self-pay

## 2013-06-27 ENCOUNTER — Emergency Department (HOSPITAL_COMMUNITY): Payer: Medicaid Other

## 2013-06-27 ENCOUNTER — Encounter (HOSPITAL_COMMUNITY): Payer: Self-pay | Admitting: Emergency Medicine

## 2013-06-27 ENCOUNTER — Emergency Department (HOSPITAL_COMMUNITY)
Admission: EM | Admit: 2013-06-27 | Discharge: 2013-06-27 | Disposition: A | Discharge status: Home / Self Care | Payer: Medicaid Other | Attending: Emergency Medicine | Admitting: Emergency Medicine

## 2013-06-27 DIAGNOSIS — Z21 Asymptomatic human immunodeficiency virus [HIV] infection status: Secondary | ICD-10-CM | POA: Insufficient documentation

## 2013-06-27 DIAGNOSIS — R51 Headache: Secondary | ICD-10-CM | POA: Insufficient documentation

## 2013-06-27 DIAGNOSIS — R509 Fever, unspecified: Secondary | ICD-10-CM | POA: Insufficient documentation

## 2013-06-27 DIAGNOSIS — Z862 Personal history of diseases of the blood and blood-forming organs and certain disorders involving the immune mechanism: Secondary | ICD-10-CM | POA: Insufficient documentation

## 2013-06-27 DIAGNOSIS — Z8619 Personal history of other infectious and parasitic diseases: Secondary | ICD-10-CM | POA: Insufficient documentation

## 2013-06-27 DIAGNOSIS — IMO0001 Reserved for inherently not codable concepts without codable children: Secondary | ICD-10-CM | POA: Insufficient documentation

## 2013-06-27 DIAGNOSIS — M791 Myalgia, unspecified site: Secondary | ICD-10-CM

## 2013-06-27 DIAGNOSIS — R000 Tachycardia, unspecified: Secondary | ICD-10-CM | POA: Insufficient documentation

## 2013-06-27 DIAGNOSIS — N390 Urinary tract infection, site not specified: Secondary | ICD-10-CM | POA: Insufficient documentation

## 2013-06-27 DIAGNOSIS — Z79899 Other long term (current) drug therapy: Secondary | ICD-10-CM | POA: Insufficient documentation

## 2013-06-27 DIAGNOSIS — R21 Rash and other nonspecific skin eruption: Secondary | ICD-10-CM | POA: Insufficient documentation

## 2013-06-27 LAB — CBC WITH DIFFERENTIAL/PLATELET
Basophils Absolute: 0 10*3/uL (ref 0.0–0.1)
Basophils Relative: 0 % (ref 0–1)
EOS ABS: 0 10*3/uL (ref 0.0–0.7)
Eosinophils Relative: 0 % (ref 0–5)
HCT: 34.5 % — ABNORMAL LOW (ref 36.0–46.0)
Hemoglobin: 10.4 g/dL — ABNORMAL LOW (ref 12.0–15.0)
LYMPHS PCT: 52 % — AB (ref 12–46)
Lymphs Abs: 3.7 10*3/uL (ref 0.7–4.0)
MCH: 18.9 pg — ABNORMAL LOW (ref 26.0–34.0)
MCHC: 30.1 g/dL (ref 30.0–36.0)
MCV: 62.7 fL — ABNORMAL LOW (ref 78.0–100.0)
Monocytes Absolute: 0.6 10*3/uL (ref 0.1–1.0)
Monocytes Relative: 9 % (ref 3–12)
NEUTROS PCT: 39 % — AB (ref 43–77)
Neutro Abs: 2.7 10*3/uL (ref 1.7–7.7)
PLATELETS: 304 10*3/uL (ref 150–400)
RBC: 5.5 MIL/uL — ABNORMAL HIGH (ref 3.87–5.11)
RDW: 18.7 % — AB (ref 11.5–15.5)
WBC: 7 10*3/uL (ref 4.0–10.5)

## 2013-06-27 LAB — URINALYSIS, ROUTINE W REFLEX MICROSCOPIC
Bilirubin Urine: NEGATIVE
Glucose, UA: NEGATIVE mg/dL
Ketones, ur: NEGATIVE mg/dL
Nitrite: NEGATIVE
Protein, ur: 30 mg/dL — AB
Specific Gravity, Urine: 1.022 (ref 1.005–1.030)
Urobilinogen, UA: 0.2 mg/dL (ref 0.0–1.0)
pH: 5.5 (ref 5.0–8.0)

## 2013-06-27 LAB — COMPREHENSIVE METABOLIC PANEL
ALT: 20 U/L (ref 0–35)
AST: 38 U/L — ABNORMAL HIGH (ref 0–37)
Albumin: 4.1 g/dL (ref 3.5–5.2)
Alkaline Phosphatase: 104 U/L (ref 39–117)
BUN: 10 mg/dL (ref 6–23)
CO2: 21 mEq/L (ref 19–32)
Calcium: 9.5 mg/dL (ref 8.4–10.5)
Chloride: 102 mEq/L (ref 96–112)
Creatinine, Ser: 0.66 mg/dL (ref 0.50–1.10)
GFR calc Af Amer: >90 mL/min (ref >90)
GFR calc non Af Amer: >90 mL/min (ref >90)
Glucose, Bld: 91 mg/dL (ref 70–99)
POTASSIUM: 4 meq/L (ref 3.7–5.3)
SODIUM: 136 meq/L — AB (ref 137–147)
TOTAL PROTEIN: 9.2 g/dL — AB (ref 6.0–8.3)
Total Bilirubin: 0.3 mg/dL (ref 0.3–1.2)

## 2013-06-27 LAB — URINE MICROSCOPIC-ADD ON

## 2013-06-27 MED ORDER — CEPHALEXIN 500 MG PO CAPS: 500.0000 mg | ORAL_CAPSULE | Freq: Three times a day (TID) | ORAL | Status: DC

## 2013-06-27 MED ORDER — IBUPROFEN 200 MG PO TABS
600.0000 mg | ORAL_TABLET | Freq: Once | ORAL | Status: AC
  Administered 2013-06-27: 600 mg via ORAL
  Filled 2013-06-27: qty 3

## 2013-06-27 NOTE — ED Provider Notes (Signed)
CSN: [id number]     Arrival date & time 06/27/13  1126 History   First MD Initiated Contact with Patient 06/27/13 1139     Chief Complaint  Patient presents with  . Generalized Body Aches     (Consider location/radiation/quality/duration/timing/severity/associated sxs/prior Treatment) HPI Comments: Patient with history of HIV, last CD4 count 440 in 04/2013, reports compliance with her anti-retroviral medications -- presents with complaint of myalgias, occasional headaches, and intermittent fever for the past 7 days. Patient denies URI symptoms including ear pain, sore throat, runny nose or nasal congestion. She denies cough, chest pain or shortness of breath. She denies urinary symptoms. Patient states that she has a headache mainly at night, and this is resolved with ibuprofen. She denies neck pain, confusion, weakness in arms or legs or other stroke like symptoms. She has not had symptoms like this in the past. [place] but no exotic travel. The onset of this condition was acute. The course is constant. Aggravating factors: none. Alleviating factors: none.    The history is provided by the patient and medical records.    Past Medical History  Diagnosis Date  . HIV infection   . HIV (human immunodeficiency virus infection)   . Sickle cell trait   . Syphilis    Past Surgical History  Procedure Laterality Date  . Dilation and curettage, diagnostic / therapeutic     Family History  Problem Relation Age of Onset  . Lupus Mother   . Hypertension Mother   . Heart failure Mother   . Heart attack Mother   . Sickle cell trait Mother   . Scleroderma Mother   . Other Neg Hx   . Hypertension Father   . Arthritis Maternal Grandmother   . Diabetes Maternal Grandmother   . Hypertension Maternal Grandmother   . Stroke Maternal Grandmother   . Heart failure Maternal Grandmother   . Heart attack Maternal Grandmother   . Asthma Maternal Grandmother   .  Arthritis Maternal Grandfather   . Diabetes Maternal Grandfather   . Hypertension Maternal Grandfather   . Stroke Maternal Grandfather   . Heart failure Maternal Grandfather   . Heart attack Maternal Grandfather   . Kidney disease Maternal Grandfather    History  Substance Use Topics  . Smoking status: Never Smoker   . Smokeless tobacco: Never Used  . Alcohol Use: Yes     Comment: Socially    OB History   Grav Para Term Preterm Abortions TAB SAB Ect Mult Living   4 1 1  2 2    1      Review of Systems  Constitutional: Positive for fever. Negative for chills and fatigue.  HENT: Negative for congestion, ear pain, rhinorrhea, sinus pressure and sore throat.   Eyes: Negative for redness.  Respiratory: Negative for cough and wheezing.   Gastrointestinal: Negative for nausea, vomiting, abdominal pain and diarrhea.  Genitourinary: Negative for dysuria.  Musculoskeletal: Positive for myalgias. Negative for neck stiffness.  Skin: Negative for rash.  Neurological: Positive for headaches. Negative for dizziness and light-headedness.  Hematological: Negative for adenopathy.  Psychiatric/Behavioral: Negative for confusion.   Allergies  Review of patient's allergies indicates no known allergies.  Home Medications   Current Outpatient Rx  Name  Route  Sig  Dispense  Refill  . acyclovir (ZOVIRAX) 400 MG tablet   Oral   Take 1 tablet (400 mg total) by mouth 3 (three) times daily.   30 tablet  0   . atazanavir (REYATAZ) 300 MG capsule   Oral   Take 1 capsule (300 mg total) by mouth at bedtime.   30 capsule   5   . azithromycin (ZITHROMAX) 500 MG tablet   Oral   Take 2 tablets (1,000 mg total) by mouth daily.   2 tablet   0   . cephALEXin (KEFLEX) 500 MG capsule   Oral   Take 1 capsule (500 mg total) by mouth 4 (four) times daily.   20 capsule   0   . emtricitabine-tenofovir (TRUVADA) 200-300 MG per tablet   Oral   Take 1 tablet by mouth at bedtime.   30 tablet   5    . ibuprofen (ADVIL,MOTRIN) 600 MG tablet   Oral   Take 1 tablet (600 mg total) by mouth every 6 (six) hours as needed for pain.   30 tablet   5   . ritonavir (NORVIR) 100 MG TABS   Oral   Take 1 tablet (100 mg total) by mouth at bedtime.   30 tablet   5   . sertraline (ZOLOFT) 50 MG tablet   Oral   Take 1 tablet (50 mg total) by mouth daily.   30 tablet   11   . valACYclovir (VALTREX) 1000 MG tablet   Oral   Take 1 tablet (1,000 mg total) by mouth 2 (two) times daily.   10 tablet   3     As needed for outbreak    BP 146/101  Pulse 100  Temp(Src) 98.6 F (37 C) (Oral)  Resp 16  SpO2 100%  Physical Exam  Nursing note and vitals reviewed. Constitutional: She is oriented to person, place, and time. She appears well-developed and well-nourished.  HENT:  Head: Normocephalic and atraumatic.  Right Ear: External ear normal.  Left Ear: External ear normal.  Nose: Nose normal.  Mouth/Throat: Oropharynx is clear and moist. No oropharyngeal exudate.  Eyes: Conjunctivae are normal. Pupils are equal, round, and reactive to light. Right eye exhibits no discharge. Left eye exhibits no discharge.  Neck: Normal range of motion. Neck supple.  Cardiovascular: Normal rate, regular rhythm and normal heart sounds.   No murmur heard. Borderline tachycardia. No murmur.   Pulmonary/Chest: Effort normal and breath sounds normal. No respiratory distress. She has no wheezes. She has no rales.  Abdominal: Soft. There is no tenderness. There is no rebound and no guarding.  Musculoskeletal: She exhibits no edema and no tenderness.  Neurological: She is alert and oriented to person, place, and time. No cranial nerve deficit. She exhibits normal muscle tone.  Skin: Skin is warm and dry. Rash noted.  Light hyperpigmentation in bilateral antecubital fossas.   Psychiatric: She has a normal mood and affect.   ED Course  Procedures (including critical care time) Labs Review Labs Reviewed  CBC  WITH DIFFERENTIAL - Abnormal; Notable for the following:    RBC 5.50 (*)    Hemoglobin 10.4 (*)    HCT 34.5 (*)    MCV 62.7 (*)    MCH 18.9 (*)    RDW 18.7 (*)    Neutrophils Relative % 39 (*)    Lymphocytes Relative 52 (*)    All other components within normal limits  COMPREHENSIVE METABOLIC PANEL - Abnormal; Notable for the following:    Sodium 136 (*)    Total Protein 9.2 (*)    AST 38 (*)    All other components within normal limits  URINALYSIS, ROUTINE W  REFLEX MICROSCOPIC - Abnormal; Notable for the following:    APPearance CLOUDY (*)    Hgb urine dipstick MODERATE (*)    Protein, ur 30 (*)    Leukocytes, UA SMALL (*)    All other components within normal limits  URINE MICROSCOPIC-ADD ON - Abnormal; Notable for the following:    Squamous Epithelial / LPF FEW (*)    Bacteria, UA MANY (*)    Casts HYALINE CASTS (*)    All other components within normal limits  URINE CULTURE   Imaging Review No results found.   EKG Interpretation None      12:04 PM Patient seen and examined. Work-up initiated. Medications ordered.   Vital signs reviewed and are as follows: Filed Vitals:   06/27/13 1131  BP: 146/101  Pulse: 100  Temp: 98.6 F (37 C)  Resp: 16   Lab results reviewed with patient. Will treat UTI with keflex, culture sent.   Patient counseled on supportive care discussed s/s to return including worsening symptoms, persistent fever, persistent vomiting, or if they have any other concerns. Urged to see PCP if symptoms persist for more than 3 days. Patient verbalizes understanding and agrees with plan.   MDM   Final diagnoses:  Myalgia  Urinary tract infection   Patient with HIV but immunocompetent on ART -- with vague symptoms of myalgias, reported fever (afebrile here). Symptoms have been ongoing for 1 week -- which could be explained by viral syndrome but screening labs undertaken due to HIV/length of symptoms. CXR and labs reassuring. UA shows probable UTI --  will treat, culture sent. Doubt endocarditis, no CP/murmur. Patient encouraged to f/u with PCP in next 3 days for recheck and return instructions discussed.   Otherwise she appears well, non-toxic.     Renne Crigler, PA-C 06/27/13 [phone]

## 2013-06-27 NOTE — ED Notes (Addendum)
Pt states that she has been having gen body aches/headache/subjective fever x 1 wk.  Denies GI sx.

## 2013-06-27 NOTE — Discharge Instructions (Signed)
Please read and follow all provided instructions.  Your diagnoses today include:  1. Myalgia   2. Urinary tract infection     Tests performed today include:  Blood counts and electrolytes - normal  Chest x-ray - normal  Urine test - shows infection  Vital signs. See below for your results today.   Medications prescribed:   Keflex (cephalexin) - antibiotic  You have been prescribed an antibiotic medicine: take the entire course of medicine even if you are feeling better. Stopping early can cause the antibiotic not to work.  Take any prescribed medications only as directed.  Home care instructions:  Follow any educational materials contained in this packet.  Follow-up instructions: Please follow-up with your primary care provider in the next 3 days for further evaluation of your symptoms. If you do not have a primary care doctor -- see below for referral information.   Return instructions:   Please return to the Emergency Department if you experience worsening symptoms.   Return with high fever, vomiting, severe headache, confusion  Please return if you have any other emergent concerns.  Additional Information:  Your vital signs today were: BP 146/101   Pulse 100   Temp(Src) 98.6 F (37 C) (Oral)   Resp 16   SpO2 100%   LMP 06/06/2013 If your blood pressure (BP) was elevated above 135/85 this visit, please have this repeated by your doctor within one month. --------------

## 2013-06-28 LAB — URINE CULTURE
CULTURE: NO GROWTH
Colony Count: NO GROWTH

## 2013-06-28 NOTE — ED Provider Notes (Signed)
Medical screening examination/treatment/procedure(s) were performed by non-physician practitioner and as supervising physician I was immediately available for consultation/collaboration.   EKG Interpretation None        Tonya JakesScott W. Levern Kalka, MD 06/28/13 [phone]

## 2013-07-10 ENCOUNTER — Ambulatory Visit: Payer: Self-pay

## 2013-07-18 ENCOUNTER — Ambulatory Visit (INDEPENDENT_AMBULATORY_CARE_PROVIDER_SITE_OTHER): Payer: Medicaid Other | Admitting: Internal Medicine

## 2013-07-18 ENCOUNTER — Encounter: Payer: Self-pay | Admitting: Internal Medicine

## 2013-07-18 VITALS — BP 138/68 | HR 66 | Temp 97.4°F | Ht 63.0 in | Wt 161.0 lb

## 2013-07-18 DIAGNOSIS — Z202 Contact with and (suspected) exposure to infections with a predominantly sexual mode of transmission: Secondary | ICD-10-CM

## 2013-07-18 DIAGNOSIS — B20 Human immunodeficiency virus [HIV] disease: Secondary | ICD-10-CM

## 2013-07-18 NOTE — Progress Notes (Signed)
  Subjective:    Patient ID: Tonya Weeks, female    DOB: 07/07/1990, 23 y.o.   MRN: [id number]  HPI  She comes in for followup of HIV. She continues on Reyataz, Norvir and Truvada.  She feels well and denies any missed doses.  She did though lose ADAP but since has got Medicaid.  Viral load is up over 1000.   RPR also checked and was u[p to 1:8.  She denies any headaches or vision changes. No weight loss or diarrhea.    Review of Systems  Constitutional: Negative for fatigue and unexpected weight change.  HENT: Negative for sore throat and trouble swallowing.   Eyes: Negative for visual disturbance.  Cardiovascular: Negative for leg swelling.  Gastrointestinal: Negative for nausea and diarrhea.  Endocrine: Negative for polyuria.  Genitourinary: Negative for menstrual problem.  Musculoskeletal: Negative for back pain.  Skin: Negative for rash.  Neurological: Negative for dizziness and light-headedness.  Hematological: Negative for adenopathy.  Psychiatric/Behavioral: Negative for dysphoric mood.       Objective:   Physical Exam  Constitutional: She is oriented to person, place, and time. She appears well-developed and well-nourished. No distress.  HENT:  Mouth/Throat: No oropharyngeal exudate.  Eyes: Right eye exhibits no discharge. Left eye exhibits no discharge. No scleral icterus.  Cardiovascular: Normal rate, regular rhythm and normal heart sounds.   No murmur heard. Pulmonary/Chest: Effort normal and breath sounds normal. No respiratory distress. She has no wheezes.  Lymphadenopathy:    She has no cervical adenopathy.  Neurological: She is alert and oriented to person, place, and time.  Skin: No rash noted.  Psychiatric: She has a normal mood and affect. Her behavior is normal.          Assessment & Plan:

## 2013-07-18 NOTE — Assessment & Plan Note (Signed)
I will recheck titer and retreat if not decreasing.

## 2013-07-18 NOTE — Assessment & Plan Note (Signed)
Was off meds for last lab.  Will recheck today with genotype.  Emphasized compliance and disscussed resistance.  RTC 2 weeks.

## 2013-07-19 LAB — RPR

## 2013-07-21 ENCOUNTER — Telehealth: Payer: Self-pay | Admitting: Licensed Clinical Social Worker

## 2013-07-21 LAB — HIV-1 RNA ULTRAQUANT REFLEX TO GENTYP+
HIV 1 RNA Quant: 14755 copies/mL — ABNORMAL HIGH (ref <20)
HIV-1 RNA Quant, Log: 4.17 {Log} — ABNORMAL HIGH (ref <1.30)

## 2013-07-21 NOTE — Telephone Encounter (Signed)
Patient agreed to stop medications per Dr. Ephriam Knucklesomer's request and she will keep her appointment on 08/10/2013 to see Dr. Luciana Axeomer

## 2013-07-21 NOTE — Telephone Encounter (Signed)
Message copied by Harvie BridgeYARBOROUGH, Saranya Harlin D on Fri Jul 21, 2013  1:46 PM ------      Message from: Gardiner BarefootOMER, ROBERT W      Created: Fri Jul 21, 2013 12:48 PM       She is resistant to her current regimen which she recently restarted.  Have her stop what she is taking and we will discuss new options at her next visit in May.  Thanks ------

## 2013-07-24 ENCOUNTER — Ambulatory Visit: Payer: Self-pay

## 2013-07-31 ENCOUNTER — Ambulatory Visit: Payer: Medicaid Other | Admitting: Obstetrics & Gynecology

## 2013-07-31 LAB — HIV-1 GENOTYPR PLUS

## 2013-08-10 ENCOUNTER — Encounter: Payer: Self-pay | Admitting: Internal Medicine

## 2013-08-10 ENCOUNTER — Ambulatory Visit (INDEPENDENT_AMBULATORY_CARE_PROVIDER_SITE_OTHER): Payer: Medicaid Other | Admitting: Internal Medicine

## 2013-08-10 VITALS — BP 137/92 | HR 76 | Temp 98.1°F | Ht 63.0 in | Wt 162.0 lb

## 2013-08-10 DIAGNOSIS — B20 Human immunodeficiency virus [HIV] disease: Secondary | ICD-10-CM

## 2013-08-10 MED ORDER — ATAZANAVIR SULFATE 300 MG PO CAPS: 300.0000 mg | ORAL_CAPSULE | Freq: Every day | ORAL | Status: DC

## 2013-08-10 MED ORDER — EMTRICITABINE-TENOFOVIR DF 200-300 MG PO TABS
1.0000 | ORAL_TABLET | Freq: Every day | ORAL | Status: AC
Start: 2013-08-10 — End: 2014-08-10

## 2013-08-10 MED ORDER — RITONAVIR 100 MG PO TABS: 100.0000 mg | ORAL_TABLET | Freq: Every day | ORAL | Status: DC

## 2013-08-10 NOTE — Progress Notes (Signed)
  Subjective:    Patient ID: Tonya DawesBrittani Ringle, female    DOB: 04/11/1990, 23 y.o.   MRN: [id number]  HPI  She comes in for followup of HIV. She was on Reyataz, Norvir and Truvada but stopped after not renewing adap.  She did though lose ADAP but since has got Medicaid.  Viral load is up over 1000.   RPR also checked and was u[p to 1:8.  She denies any headaches or vision changes. No weight loss or diarrhea. Genotype checked and wild type.     Review of Systems  Constitutional: Negative for fatigue and unexpected weight change.  HENT: Negative for sore throat and trouble swallowing.   Eyes: Negative for visual disturbance.  Cardiovascular: Negative for leg swelling.  Gastrointestinal: Negative for nausea and diarrhea.  Endocrine: Negative for polyuria.  Genitourinary: Negative for menstrual problem.  Musculoskeletal: Negative for back pain.  Skin: Negative for rash.  Neurological: Negative for dizziness and light-headedness.  Hematological: Negative for adenopathy.  Psychiatric/Behavioral: Negative for dysphoric mood.       Objective:   Physical Exam  Constitutional: She is oriented to person, place, and time. She appears well-developed and well-nourished. No distress.  HENT:  Mouth/Throat: No oropharyngeal exudate.  Eyes: Right eye exhibits no discharge. Left eye exhibits no discharge. No scleral icterus.  Cardiovascular: Normal rate, regular rhythm and normal heart sounds.   No murmur heard. Pulmonary/Chest: Effort normal and breath sounds normal. No respiratory distress. She has no wheezes.  Lymphadenopathy:    She has no cervical adenopathy.  Neurological: She is alert and oriented to person, place, and time.  Skin: No rash noted.  Psychiatric: She has a normal mood and affect. Her behavior is normal.          Assessment & Plan:

## 2013-08-10 NOTE — Assessment & Plan Note (Signed)
Restart same regimen.  Labs in 3 weeks and follow up in 4 weeks.

## 2013-08-28 ENCOUNTER — Ambulatory Visit (INDEPENDENT_AMBULATORY_CARE_PROVIDER_SITE_OTHER): Payer: Medicaid Other | Admitting: Internal Medicine

## 2013-08-28 ENCOUNTER — Encounter: Payer: Self-pay | Admitting: Internal Medicine

## 2013-08-28 VITALS — BP 142/87 | HR 84 | Temp 98.2°F | Ht 63.0 in | Wt 159.0 lb

## 2013-08-28 DIAGNOSIS — B009 Herpesviral infection, unspecified: Secondary | ICD-10-CM

## 2013-08-28 DIAGNOSIS — B20 Human immunodeficiency virus [HIV] disease: Secondary | ICD-10-CM

## 2013-08-28 MED ORDER — VALACYCLOVIR HCL 1 G PO TABS: 1000.0000 mg | ORAL_TABLET | Freq: Two times a day (BID) | ORAL | Status: DC

## 2013-08-28 NOTE — Assessment & Plan Note (Signed)
Second outbreak

## 2013-08-28 NOTE — Progress Notes (Signed)
  Subjective:    Patient ID: Tonya Weeks, female    DOB: 09-Mar-1991, 23 y.o.   MRN: [id number]  HPI  She comes in for followup of HIV. She was on Reyataz, Norvir and Truvada but stopped after not renewing adap.  She did though lose ADAP but since has got Medicaid.  Viral load is up over 1000.   RPR also checked and was u[p to 1:8.  She denies any headaches or vision changes. No weight loss or diarrhea. Genotype checked and wild type.  Comes in for follow up after starting 2 weeks ago.  No new issues.  Has had an outbreak of genital herpes.  Second outbreak.  Started about 1 week ago.     Review of Systems  Constitutional: Negative for fatigue and unexpected weight change.  HENT: Negative for sore throat and trouble swallowing.   Eyes: Negative for visual disturbance.  Cardiovascular: Negative for leg swelling.  Gastrointestinal: Negative for nausea and diarrhea.  Endocrine: Negative for polyuria.  Genitourinary: Negative for menstrual problem.  Musculoskeletal: Negative for back pain.  Skin: Negative for rash.  Neurological: Negative for dizziness and light-headedness.  Hematological: Negative for adenopathy.  Psychiatric/Behavioral: Negative for dysphoric mood.       Objective:   Physical Exam  Constitutional: She is oriented to person, place, and time. She appears well-developed and well-nourished. No distress.  HENT:  Mouth/Throat: No oropharyngeal exudate.  Eyes: Right eye exhibits no discharge. Left eye exhibits no discharge. No scleral icterus.  Cardiovascular: Normal rate, regular rhythm and normal heart sounds.   No murmur heard. Pulmonary/Chest: Effort normal and breath sounds normal. No respiratory distress. She has no wheezes.  Lymphadenopathy:    She has no cervical adenopathy.  Neurological: She is alert and oriented to person, place, and time.  Skin: No rash noted.  Psychiatric: She has a normal mood and affect. Her behavior is normal.          Assessment &  Plan:

## 2013-08-28 NOTE — Assessment & Plan Note (Signed)
Labs next week and follow up in 2 weeks.

## 2013-09-06 ENCOUNTER — Telehealth: Payer: Self-pay | Admitting: *Deleted

## 2013-09-06 NOTE — Telephone Encounter (Signed)
Needing lab work appt now per Dr. Luciana Axe.

## 2013-10-03 ENCOUNTER — Other Ambulatory Visit: Payer: Medicaid Other

## 2013-10-03 DIAGNOSIS — B20 Human immunodeficiency virus [HIV] disease: Secondary | ICD-10-CM

## 2013-10-03 LAB — COMPLETE METABOLIC PANEL WITH GFR
ALBUMIN: 4.2 g/dL (ref 3.5–5.2)
ALT: 13 U/L (ref 0–35)
AST: 19 U/L (ref 0–37)
Alkaline Phosphatase: 85 U/L (ref 39–117)
BUN: 9 mg/dL (ref 6–23)
CALCIUM: 9.6 mg/dL (ref 8.4–10.5)
CHLORIDE: 103 meq/L (ref 96–112)
CO2: 24 mEq/L (ref 19–32)
Creat: 0.66 mg/dL (ref 0.50–1.10)
GLUCOSE: 90 mg/dL (ref 70–99)
POTASSIUM: 4.3 meq/L (ref 3.5–5.3)
SODIUM: 135 meq/L (ref 135–145)
TOTAL PROTEIN: 8.1 g/dL (ref 6.0–8.3)
Total Bilirubin: 0.3 mg/dL (ref 0.2–1.2)

## 2013-10-03 LAB — CBC WITH DIFFERENTIAL/PLATELET
BASOS ABS: 0 10*3/uL (ref 0.0–0.1)
BASOS PCT: 0 % (ref 0–1)
Eosinophils Absolute: 0.1 10*3/uL (ref 0.0–0.7)
Eosinophils Relative: 1 % (ref 0–5)
HCT: 30.7 % — ABNORMAL LOW (ref 36.0–46.0)
Hemoglobin: 9.8 g/dL — ABNORMAL LOW (ref 12.0–15.0)
Lymphocytes Relative: 49 % — ABNORMAL HIGH (ref 12–46)
Lymphs Abs: 2.8 10*3/uL (ref 0.7–4.0)
MCH: 20.2 pg — ABNORMAL LOW (ref 26.0–34.0)
MCHC: 31.9 g/dL (ref 30.0–36.0)
MCV: 63.3 fL — ABNORMAL LOW (ref 78.0–100.0)
Monocytes Absolute: 0.6 10*3/uL (ref 0.1–1.0)
Monocytes Relative: 10 % (ref 3–12)
NEUTROS ABS: 2.3 10*3/uL (ref 1.7–7.7)
Neutrophils Relative %: 40 % — ABNORMAL LOW (ref 43–77)
PLATELETS: 326 10*3/uL (ref 150–400)
RBC: 4.85 MIL/uL (ref 3.87–5.11)
RDW: 21.1 % — AB (ref 11.5–15.5)
WBC: 5.7 10*3/uL (ref 4.0–10.5)

## 2013-10-04 LAB — T-HELPER CELL (CD4) - (RCID CLINIC ONLY)
CD4 T CELL HELPER: 20 % — AB (ref 33–55)
CD4 T Cell Abs: 570 /uL (ref 400–2700)

## 2013-10-05 LAB — HIV-1 RNA QUANT-NO REFLEX-BLD
HIV 1 RNA Quant: 30 copies/mL — ABNORMAL HIGH (ref <20)
HIV-1 RNA Quant, Log: 1.48 {Log} — ABNORMAL HIGH (ref <1.30)

## 2013-10-12 ENCOUNTER — Ambulatory Visit (INDEPENDENT_AMBULATORY_CARE_PROVIDER_SITE_OTHER): Payer: Medicaid Other | Admitting: Internal Medicine

## 2013-10-12 ENCOUNTER — Encounter: Payer: Self-pay | Admitting: Internal Medicine

## 2013-10-12 VITALS — BP 124/79 | HR 86 | Temp 97.3°F | Wt 163.0 lb

## 2013-10-12 DIAGNOSIS — Z79899 Other long term (current) drug therapy: Secondary | ICD-10-CM

## 2013-10-12 DIAGNOSIS — Z113 Encounter for screening for infections with a predominantly sexual mode of transmission: Secondary | ICD-10-CM

## 2013-10-12 DIAGNOSIS — Z0000 Encounter for general adult medical examination without abnormal findings: Secondary | ICD-10-CM | POA: Insufficient documentation

## 2013-10-12 DIAGNOSIS — B20 Human immunodeficiency virus [HIV] disease: Secondary | ICD-10-CM

## 2013-10-12 NOTE — Progress Notes (Signed)
  Subjective:    Patient ID: Tonya Weeks, female    DOB: 09/17/1990, 23 y.o.   MRN: [id number]  HPI  She comes in for followup of HIV. She was on Reyataz, Norvir and Truvada but stopped after not renewing adap.  She did though lose ADAP but since has got Medicaid.  Viral load was up over 1000.  No weight loss or diarrhea.  Now restarted and viral load suppressed with CD4 of 570.  Feels well.     Review of Systems  Constitutional: Negative for fatigue and unexpected weight change.  HENT: Negative for sore throat and trouble swallowing.   Eyes: Negative for visual disturbance.  Cardiovascular: Negative for leg swelling.  Gastrointestinal: Negative for nausea and diarrhea.  Endocrine: Negative for polyuria.  Genitourinary: Negative for menstrual problem.  Musculoskeletal: Negative for back pain.  Skin: Negative for rash.  Neurological: Negative for dizziness and light-headedness.  Hematological: Negative for adenopathy.  Psychiatric/Behavioral: Negative for dysphoric mood.       Objective:   Physical Exam  Constitutional: She is oriented to person, place, and time. She appears well-developed and well-nourished. No distress.  HENT:  Mouth/Throat: No oropharyngeal exudate.  Eyes: Right eye exhibits no discharge. Left eye exhibits no discharge. No scleral icterus.  Cardiovascular: Normal rate, regular rhythm and normal heart sounds.   No murmur heard. Pulmonary/Chest: Effort normal and breath sounds normal. No respiratory distress. She has no wheezes.  Lymphadenopathy:    She has no cervical adenopathy.  Neurological: She is alert and oriented to person, place, and time.  Skin: No rash noted.  Psychiatric: She has a normal mood and affect. Her behavior is normal.          Assessment & Plan:

## 2013-10-12 NOTE — Assessment & Plan Note (Addendum)
Doing great.  RTC 4 months.  Schedule for pap.

## 2013-10-17 ENCOUNTER — Ambulatory Visit (INDEPENDENT_AMBULATORY_CARE_PROVIDER_SITE_OTHER): Payer: Medicaid Other | Admitting: *Deleted

## 2013-10-17 ENCOUNTER — Other Ambulatory Visit (HOSPITAL_COMMUNITY)
Admission: RE | Admit: 2013-10-17 | Discharge: 2013-10-17 | Disposition: A | Discharge status: Home / Self Care | Payer: Medicaid Other | Source: Ambulatory Visit | Attending: Internal Medicine | Admitting: Internal Medicine

## 2013-10-17 DIAGNOSIS — N760 Acute vaginitis: Secondary | ICD-10-CM | POA: Insufficient documentation

## 2013-10-17 DIAGNOSIS — Z124 Encounter for screening for malignant neoplasm of cervix: Secondary | ICD-10-CM

## 2013-10-17 DIAGNOSIS — Z01419 Encounter for gynecological examination (general) (routine) without abnormal findings: Secondary | ICD-10-CM | POA: Insufficient documentation

## 2013-10-17 DIAGNOSIS — Z113 Encounter for screening for infections with a predominantly sexual mode of transmission: Secondary | ICD-10-CM | POA: Insufficient documentation

## 2013-10-17 DIAGNOSIS — N898 Other specified noninflammatory disorders of vagina: Secondary | ICD-10-CM

## 2013-10-17 NOTE — Patient Instructions (Signed)
Your results will be ready in about a week.  I will mail them to you.  Thank you for coming to [facility] for your [facility].  Audria Takeshita,  RN 

## 2013-10-17 NOTE — Progress Notes (Signed)
  Subjective:     Tonya Weeks is a 23 y.o. woman who comes in today for a  pap smear only. Previous abnormal Pap smears: no. Contraception:  condoms  Objective:    LMP 10/02/2013 Pelvic Exam:  Pap smear obtained.   Assessment:    Screening pap smear.   Plan:    Follow up in one year, or as indicated by Pap results.  Pt given educational materials re: HIV and women, self-esteem, BSE, nutrition and diet management, PAP smears and partner safety.

## 2013-10-19 LAB — CYTOLOGY - PAP

## 2013-10-22 ENCOUNTER — Emergency Department (HOSPITAL_BASED_OUTPATIENT_CLINIC_OR_DEPARTMENT_OTHER)
Admission: EM | Admit: 2013-10-22 | Discharge: 2013-10-22 | Disposition: A | Discharge status: Home / Self Care | Payer: Medicaid Other | Attending: Emergency Medicine | Admitting: Emergency Medicine

## 2013-10-22 ENCOUNTER — Emergency Department (HOSPITAL_BASED_OUTPATIENT_CLINIC_OR_DEPARTMENT_OTHER): Payer: Medicaid Other

## 2013-10-22 ENCOUNTER — Encounter (HOSPITAL_BASED_OUTPATIENT_CLINIC_OR_DEPARTMENT_OTHER): Payer: Self-pay | Admitting: Emergency Medicine

## 2013-10-22 DIAGNOSIS — Z862 Personal history of diseases of the blood and blood-forming organs and certain disorders involving the immune mechanism: Secondary | ICD-10-CM | POA: Insufficient documentation

## 2013-10-22 DIAGNOSIS — S0003XA Contusion of scalp, initial encounter: Secondary | ICD-10-CM | POA: Insufficient documentation

## 2013-10-22 DIAGNOSIS — S0083XA Contusion of other part of head, initial encounter: Secondary | ICD-10-CM | POA: Insufficient documentation

## 2013-10-22 DIAGNOSIS — S1093XA Contusion of unspecified part of neck, initial encounter: Principal | ICD-10-CM

## 2013-10-22 DIAGNOSIS — Z21 Asymptomatic human immunodeficiency virus [HIV] infection status: Secondary | ICD-10-CM | POA: Diagnosis not present

## 2013-10-22 DIAGNOSIS — IMO0002 Reserved for concepts with insufficient information to code with codable children: Secondary | ICD-10-CM | POA: Diagnosis not present

## 2013-10-22 DIAGNOSIS — S0993XA Unspecified injury of face, initial encounter: Secondary | ICD-10-CM | POA: Diagnosis present

## 2013-10-22 DIAGNOSIS — Z791 Long term (current) use of non-steroidal anti-inflammatories (NSAID): Secondary | ICD-10-CM | POA: Diagnosis not present

## 2013-10-22 DIAGNOSIS — Z8619 Personal history of other infectious and parasitic diseases: Secondary | ICD-10-CM | POA: Diagnosis not present

## 2013-10-22 DIAGNOSIS — Z79899 Other long term (current) drug therapy: Secondary | ICD-10-CM | POA: Insufficient documentation

## 2013-10-22 DIAGNOSIS — S199XXA Unspecified injury of neck, initial encounter: Secondary | ICD-10-CM

## 2013-10-22 MED ORDER — HYDROCODONE-ACETAMINOPHEN 5-325 MG PO TABS: 1.0000 | ORAL_TABLET | ORAL | Status: DC | PRN

## 2013-10-22 NOTE — ED Provider Notes (Signed)
Medical screening examination/treatment/procedure(s) were performed by non-physician practitioner and as supervising physician I was immediately available for consultation/collaboration.   EKG Interpretation None        Jonnette Nuon, MD 10/22/13 2258 

## 2013-10-22 NOTE — Discharge Instructions (Signed)
Contusion °A contusion is a deep bruise. Contusions are the result of an injury that caused bleeding under the skin. The contusion may turn blue, purple, or yellow. Minor injuries will give you a painless contusion, but more severe contusions may stay painful and swollen for a few weeks.  °CAUSES  °A contusion is usually caused by a blow, trauma, or direct force to an area of the body. °SYMPTOMS  °· Swelling and redness of the injured area. °· Bruising of the injured area. °· Tenderness and soreness of the injured area. °· Pain. °DIAGNOSIS  °The diagnosis can be made by taking a history and physical exam. An X-ray, CT scan, or MRI may be needed to determine if there were any associated injuries, such as fractures. °TREATMENT  °Specific treatment will depend on what area of the body was injured. In general, the best treatment for a contusion is resting, icing, elevating, and applying cold compresses to the injured area. Over-the-counter medicines may also be recommended for pain control. Ask your caregiver what the best treatment is for your contusion. °HOME CARE INSTRUCTIONS  °· Put ice on the injured area. °· Put ice in a plastic bag. °· Place a towel between your skin and the bag. °· Leave the ice on for 15-20 minutes, 3-4 times a day, or as directed by your health care provider. °· Only take over-the-counter or prescription medicines for pain, discomfort, or fever as directed by your caregiver. Your caregiver may recommend avoiding anti-inflammatory medicines (aspirin, ibuprofen, and naproxen) for 48 hours because these medicines may increase bruising. °· Rest the injured area. °· If possible, elevate the injured area to reduce swelling. °SEEK IMMEDIATE MEDICAL CARE IF:  °· You have increased bruising or swelling. °· You have pain that is getting worse. °· Your swelling or pain is not relieved with medicines. °MAKE SURE YOU:  °· Understand these instructions. °· Will watch your condition. °· Will get help right  away if you are not doing well or get worse. °Document Released: 12/24/2004 Document Revised: 03/21/2013 Document Reviewed: 01/19/2011 °ExitCare® Patient Information ©2015 [employer]. This information is not intended to replace advice given to you by your health care provider. Make sure you discuss any questions you have with your health care provider. ° °Cryotherapy °Cryotherapy means treatment with cold. Ice or gel packs can be used to reduce both pain and swelling. Ice is the most helpful within the first 24 to 48 hours after an injury or flare-up from overusing a muscle or joint. Sprains, strains, spasms, burning pain, shooting pain, and aches can all be eased with ice. Ice can also be used when recovering from surgery. Ice is effective, has very few side effects, and is safe for most people to use. °PRECAUTIONS  °Ice is not a safe treatment option for people with: °· Raynaud phenomenon. This is a condition affecting small blood vessels in the extremities. Exposure to cold may cause your problems to return. °· Cold hypersensitivity. There are many forms of cold hypersensitivity, including: °¨ Cold urticaria. Red, itchy hives appear on the skin when the tissues begin to warm after being iced. °¨ Cold erythema. This is a red, itchy rash caused by exposure to cold. °¨ Cold hemoglobinuria. Red blood cells break down when the tissues begin to warm after being iced. The hemoglobin that carry oxygen are passed into the urine because they cannot combine with blood proteins fast enough. °· Numbness or altered sensitivity in the area being iced. °If you have any   of the following conditions, do not use ice until you have discussed cryotherapy with your caregiver: °· Heart conditions, such as arrhythmia, angina, or chronic heart disease. °· High blood pressure. °· Healing wounds or open skin in the area being iced. °· Current infections. °· Rheumatoid arthritis. °· Poor circulation. °· Diabetes. °Ice slows the blood flow  in the region it is applied. This is beneficial when trying to stop inflamed tissues from spreading irritating chemicals to surrounding tissues. However, if you expose your skin to cold temperatures for too long or without the proper protection, you can damage your skin or nerves. Watch for signs of skin damage due to cold. °HOME CARE INSTRUCTIONS °Follow these tips to use ice and cold packs safely. °· Place a dry or damp towel between the ice and skin. A damp towel will cool the skin more quickly, so you may need to shorten the time that the ice is used. °· For a more rapid response, add gentle compression to the ice. °· Ice for no more than 10 to 20 minutes at a time. The bonier the area you are icing, the less time it will take to get the benefits of ice. °· Check your skin after 5 minutes to make sure there are no signs of a poor response to cold or skin damage. °· Rest 20 minutes or more between uses. °· Once your skin is numb, you can end your treatment. You can test numbness by very lightly touching your skin. The touch should be so light that you do not see the skin dimple from the pressure of your fingertip. When using ice, most people will feel these normal sensations in this order: cold, burning, aching, and numbness. °· Do not use ice on someone who cannot communicate their responses to pain, such as small children or people with dementia. °HOW TO MAKE AN ICE PACK °Ice packs are the most common way to use ice therapy. Other methods include ice massage, ice baths, and cryosprays. Muscle creams that cause a cold, tingly feeling do not offer the same benefits that ice offers and should not be used as a substitute unless recommended by your caregiver. °To make an ice pack, do one of the following: °· Place crushed ice or a bag of frozen vegetables in a sealable plastic bag. Squeeze out the excess air. Place this bag inside another plastic bag. Slide the bag into a pillowcase or place a damp towel between  your skin and the bag. °· Mix 3 parts water with 1 part rubbing alcohol. Freeze the mixture in a sealable plastic bag. When you remove the mixture from the freezer, it will be slushy. Squeeze out the excess air. Place this bag inside another plastic bag. Slide the bag into a pillowcase or place a damp towel between your skin and the bag. °SEEK MEDICAL CARE IF: °· You develop Gullo spots on your skin. This may give the skin a blotchy (mottled) appearance. °· Your skin turns blue or pale. °· Your skin becomes waxy or hard. °· Your swelling gets worse. °MAKE SURE YOU:  °· Understand these instructions. °· Will watch your condition. °· Will get help right away if you are not doing well or get worse. °Document Released: 11/10/2010 Document Revised: 07/31/2013 Document Reviewed: 11/10/2010 °ExitCare® Patient Information ©2015 [employer]. This information is not intended to replace advice given to you by your health care provider. Make sure you discuss any questions you have with your health care provider. ° °
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
## 2013-10-25 MED ORDER — METRONIDAZOLE 500 MG PO TABS: 2000.0000 mg | ORAL_TABLET | Freq: Once | ORAL | Status: DC

## 2013-10-25 NOTE — Progress Notes (Signed)
Per Dr. Drue SecondSnider, treat Trichomonas Vaginalis w/ Flagyl 2 grams once.

## 2013-10-25 NOTE — Addendum Note (Signed)
Addended by: Jennet MaduroESTRIDGE, Brynli Ollis D on: 10/25/2013 02:35 PM   Modules accepted: Orders

## 2013-10-31 ENCOUNTER — Other Ambulatory Visit: Payer: Self-pay | Admitting: Internal Medicine

## 2013-10-31 MED ORDER — METRONIDAZOLE 500 MG PO TABS: 2000.0000 mg | ORAL_TABLET | Freq: Once | ORAL | Status: DC

## 2013-10-31 MED ORDER — FLUCONAZOLE 150 MG PO TABS: 150.0000 mg | ORAL_TABLET | Freq: Once | ORAL | Status: DC

## 2013-10-31 NOTE — Progress Notes (Signed)
I left patient a message on voice mail  to call the office back.

## 2013-11-17 ENCOUNTER — Other Ambulatory Visit: Payer: Self-pay | Admitting: *Deleted

## 2013-11-17 MED ORDER — VALACYCLOVIR HCL 1 G PO TABS: 1000.0000 mg | ORAL_TABLET | Freq: Two times a day (BID) | ORAL | Status: DC

## 2014-01-29 ENCOUNTER — Encounter (HOSPITAL_BASED_OUTPATIENT_CLINIC_OR_DEPARTMENT_OTHER): Payer: Self-pay | Admitting: Emergency Medicine

## 2014-02-13 ENCOUNTER — Other Ambulatory Visit: Payer: Self-pay

## 2014-02-27 ENCOUNTER — Ambulatory Visit: Payer: Medicaid Other | Admitting: Internal Medicine

## 2014-03-26 ENCOUNTER — Encounter: Payer: Self-pay | Admitting: *Deleted

## 2014-03-27 ENCOUNTER — Encounter: Payer: Self-pay | Admitting: Obstetrics & Gynecology

## 2014-05-03 ENCOUNTER — Other Ambulatory Visit: Payer: Self-pay

## 2014-05-22 ENCOUNTER — Ambulatory Visit: Payer: Self-pay | Admitting: Internal Medicine

## 2014-09-04 ENCOUNTER — Emergency Department (HOSPITAL_COMMUNITY)
Admission: EM | Admit: 2014-09-04 | Discharge: 2014-09-04 | Disposition: A | Discharge status: Home / Self Care | Payer: Medicaid Other | Attending: Emergency Medicine | Admitting: Emergency Medicine

## 2014-09-04 ENCOUNTER — Encounter (HOSPITAL_COMMUNITY): Payer: Self-pay | Admitting: Emergency Medicine

## 2014-09-04 DIAGNOSIS — M436 Torticollis: Secondary | ICD-10-CM

## 2014-09-04 DIAGNOSIS — Z8619 Personal history of other infectious and parasitic diseases: Secondary | ICD-10-CM | POA: Insufficient documentation

## 2014-09-04 DIAGNOSIS — Z862 Personal history of diseases of the blood and blood-forming organs and certain disorders involving the immune mechanism: Secondary | ICD-10-CM | POA: Insufficient documentation

## 2014-09-04 DIAGNOSIS — Z79899 Other long term (current) drug therapy: Secondary | ICD-10-CM | POA: Insufficient documentation

## 2014-09-04 DIAGNOSIS — Z21 Asymptomatic human immunodeficiency virus [HIV] infection status: Secondary | ICD-10-CM | POA: Insufficient documentation

## 2014-09-04 MED ORDER — HYDROCODONE-ACETAMINOPHEN 5-325 MG PO TABS
1.0000 | ORAL_TABLET | ORAL | Status: DC | PRN
Start: 2014-09-04 — End: 2017-07-08

## 2014-09-04 MED ORDER — NAPROXEN 500 MG PO TABS: 500.0000 mg | ORAL_TABLET | Freq: Two times a day (BID) | ORAL | Status: DC

## 2014-09-04 MED ORDER — METHOCARBAMOL 500 MG PO TABS: 500.0000 mg | ORAL_TABLET | Freq: Two times a day (BID) | ORAL | Status: DC

## 2014-09-04 MED ORDER — HYDROCODONE-ACETAMINOPHEN 5-325 MG PO TABS
1.0000 | ORAL_TABLET | Freq: Once | ORAL | Status: AC
  Administered 2014-09-04: 1 via ORAL
  Filled 2014-09-04: qty 1

## 2014-09-04 MED ORDER — METHOCARBAMOL 500 MG PO TABS
1000.0000 mg | ORAL_TABLET | Freq: Once | ORAL | Status: AC
  Administered 2014-09-04: 1000 mg via ORAL
  Filled 2014-09-04: qty 2
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
## 2014-09-04 NOTE — Discharge Instructions (Signed)

## 2014-09-11 NOTE — ED Provider Notes (Signed)
CSN: [id number]     Arrival date & time 09/04/14  0416 History   First MD Initiated Contact with Patient 09/04/14 [phone]     Chief Complaint  Patient presents with  . Headache  . Generalized Body Aches      HPI  Patient presents for evaluation of right-sided face and neck pain. She has no states patient complains of body aches. This is actually limited to her head and her neck. She's taken Tylenol at home with minimal relief. She feels that she cannot move her head towards her left without worsening the pain. No falls no injuries or trauma. No fevers or chills.  Past Medical History  Diagnosis Date  . HIV infection   . HIV (human immunodeficiency virus infection)   . Sickle cell trait   . Syphilis    Past Surgical History  Procedure Laterality Date  . Dilation and curettage, diagnostic / therapeutic     Family History  Problem Relation Age of Onset  . Lupus Mother   . Hypertension Mother   . Heart failure Mother   . Heart attack Mother   . Sickle cell trait Mother   . Scleroderma Mother   . Other Neg Hx   . Hypertension Father   . Arthritis Maternal Grandmother   . Diabetes Maternal Grandmother   . Hypertension Maternal Grandmother   . Stroke Maternal Grandmother   . Heart failure Maternal Grandmother   . Heart attack Maternal Grandmother   . Asthma Maternal Grandmother   . Arthritis Maternal Grandfather   . Diabetes Maternal Grandfather   . Hypertension Maternal Grandfather   . Stroke Maternal Grandfather   . Heart failure Maternal Grandfather   . Heart attack Maternal Grandfather   . Kidney disease Maternal Grandfather    History  Substance Use Topics  . Smoking status: Never Smoker   . Smokeless tobacco: Never Used  . Alcohol Use: Yes     Comment: Socially    OB History    Gravida Para Term Preterm AB TAB SAB Ectopic Multiple Living   Review of Systems  Constitutional: Negative for fever, chills, diaphoresis, appetite change and  fatigue.  HENT: Negative for mouth sores, sore throat and trouble swallowing.   Eyes: Negative for visual disturbance.  Respiratory: Negative for cough, chest tightness, shortness of breath and wheezing.   Cardiovascular: Negative for chest pain.  Gastrointestinal: Negative for nausea, vomiting, abdominal pain, diarrhea and abdominal distention.  Endocrine: Negative for polydipsia, polyphagia and polyuria.  Genitourinary: Negative for dysuria, frequency and hematuria.  Musculoskeletal: Positive for neck pain. Negative for gait problem.  Skin: Negative for color change, pallor and rash.  Neurological: Negative for dizziness, syncope, light-headedness and headaches.  Hematological: Does not bruise/bleed easily.  Psychiatric/Behavioral: Negative for behavioral problems and confusion.      Allergies  Review of patient's allergies indicates no known allergies.  Home Medications   Prior to Admission medications   Medication Sig Start Date End Date Taking? Authorizing Provider  atazanavir (REYATAZ) 300 MG capsule Take 1 capsule (300 mg total) by mouth at bedtime. 08/10/13  Yes Gardiner Barefoot, MD  hydrocortisone cream 1 % Apply 1 application topically as needed for itching.   Yes Historical Provider, MD  ibuprofen (ADVIL,MOTRIN) 600 MG tablet Take 1 tablet (600 mg total) by mouth every 6 (six) hours as needed for pain. 03/11/12  Yes Brock Bad, MD  ritonavir Nathen May)  100 MG TABS tablet Take 1 tablet (100 mg total) by mouth at bedtime. 08/10/13  Yes Gardiner Barefoot, MD  sertraline (ZOLOFT) 50 MG tablet Take 100 mg by mouth daily. 03/11/12  Yes Brock Bad, MD  HYDROcodone-acetaminophen (NORCO/VICODIN) 5-325 MG per tablet Take 1 tablet by mouth every 4 (four) hours as needed. 09/04/14   Rolland Porter, MD  methocarbamol (ROBAXIN) 500 MG tablet Take 1 tablet (500 mg total) by mouth 2 (two) times daily. 09/04/14   Rolland Porter, MD  naproxen (NAPROSYN) 500 MG tablet Take 1 tablet (500 mg total) by  mouth 2 (two) times daily. 09/04/14   Rolland Porter, MD   BP 143/86 mmHg  Pulse 89  Temp(Src) 98.3 F (36.8 C) (Oral)  Resp 16  SpO2 98%  LMP 07/29/2014 (Approximate) Physical Exam  Constitutional: She is oriented to person, place, and time. She appears well-developed and well-nourished. No distress.  HENT:  Head: Normocephalic.  Eyes: Conjunctivae are normal. Pupils are equal, round, and reactive to light. No scleral icterus.  Neck: Neck supple. No thyromegaly present.    Difficulty rotating the neck. Does not have pain or stiffness with flexion or meningismus.  Cardiovascular: Normal rate and regular rhythm.  Exam reveals no gallop and no friction rub.   No murmur heard. Pulmonary/Chest: Effort normal and breath sounds normal. No respiratory distress. She has no wheezes. She has no rales.  Abdominal: Soft. Bowel sounds are normal. She exhibits no distension. There is no tenderness. There is no rebound.  Musculoskeletal: Normal range of motion.  Neurological: She is alert and oriented to person, place, and time.  Skin: Skin is warm and dry. No rash noted.  Psychiatric: She has a normal mood and affect. Her behavior is normal.    ED Course  Procedures (including critical care time) Labs Review Labs Reviewed - No data to display  Imaging Review No results found.   EKG Interpretation None      MDM   Final diagnoses:  Torticollis    Plan will be anti-inflammatory, pain medicines, muscle relaxant. Primary care follow-up if not improving.    Rolland Porter, MD 09/11/14 [phone]

## 2015-03-21 ENCOUNTER — Other Ambulatory Visit: Payer: Medicaid Other

## 2015-03-21 DIAGNOSIS — Z113 Encounter for screening for infections with a predominantly sexual mode of transmission: Secondary | ICD-10-CM

## 2015-03-21 DIAGNOSIS — B20 Human immunodeficiency virus [HIV] disease: Secondary | ICD-10-CM

## 2015-03-21 LAB — CBC WITH DIFFERENTIAL/PLATELET
BASOS ABS: 0 10*3/uL (ref 0.0–0.1)
BASOS PCT: 0 % (ref 0–1)
EOS ABS: 0.2 10*3/uL (ref 0.0–0.7)
Eosinophils Relative: 3 % (ref 0–5)
HCT: 38 % (ref 36.0–46.0)
Hemoglobin: 12.3 g/dL (ref 12.0–15.0)
Lymphocytes Relative: 50 % — ABNORMAL HIGH (ref 12–46)
Lymphs Abs: 2.9 10*3/uL (ref 0.7–4.0)
MCH: 24.8 pg — AB (ref 26.0–34.0)
MCHC: 32.4 g/dL (ref 30.0–36.0)
MCV: 76.6 fL — ABNORMAL LOW (ref 78.0–100.0)
MONOS PCT: 9 % (ref 3–12)
MPV: 10.5 fL (ref 8.6–12.4)
Monocytes Absolute: 0.5 10*3/uL (ref 0.1–1.0)
NEUTROS PCT: 38 % — AB (ref 43–77)
Neutro Abs: 2.2 10*3/uL (ref 1.7–7.7)
PLATELETS: 245 10*3/uL (ref 150–400)
RBC: 4.96 MIL/uL (ref 3.87–5.11)
RDW: 17 % — ABNORMAL HIGH (ref 11.5–15.5)
WBC: 5.8 10*3/uL (ref 4.0–10.5)

## 2015-03-21 LAB — COMPLETE METABOLIC PANEL WITH GFR
ALT: 15 U/L (ref 6–29)
AST: 24 U/L (ref 10–30)
Albumin: 4.3 g/dL (ref 3.6–5.1)
Alkaline Phosphatase: 62 U/L (ref 33–115)
BILIRUBIN TOTAL: 0.3 mg/dL (ref 0.2–1.2)
BUN: 6 mg/dL — ABNORMAL LOW (ref 7–25)
CO2: 26 mmol/L (ref 20–31)
Calcium: 9.7 mg/dL (ref 8.6–10.2)
Chloride: 103 mmol/L (ref 98–110)
Creat: 0.61 mg/dL (ref 0.50–1.10)
GFR, Est African American: >89 mL/min (ref >=60)
GLUCOSE: 92 mg/dL (ref 65–99)
Potassium: 3.9 mmol/L (ref 3.5–5.3)
SODIUM: 136 mmol/L (ref 135–146)
TOTAL PROTEIN: 8.6 g/dL — AB (ref 6.1–8.1)

## 2015-03-22 LAB — T-HELPER CELL (CD4) - (RCID CLINIC ONLY)
CD4 T CELL HELPER: 13 % — AB (ref 33–55)
CD4 T Cell Abs: 410 /uL (ref 400–2700)

## 2015-03-22 LAB — RPR: RPR Ser Ql: REACTIVE — AB

## 2015-03-22 LAB — RPR TITER

## 2015-03-22 LAB — FLUORESCENT TREPONEMAL AB(FTA)-IGG-BLD: FLUORESCENT TREPONEMAL ABS: REACTIVE — AB

## 2015-03-23 LAB — HIV-1 RNA ULTRAQUANT REFLEX TO GENTYP+
HIV 1 RNA QUANT: 13391 {copies}/mL — AB (ref <20)
HIV-1 RNA QUANT, LOG: 4.13 {Log_copies}/mL — AB (ref <1.30)

## 2015-03-26 ENCOUNTER — Other Ambulatory Visit: Payer: Self-pay

## 2015-04-05 LAB — HIV-1 GENOTYPR PLUS

## 2015-04-15 ENCOUNTER — Ambulatory Visit (INDEPENDENT_AMBULATORY_CARE_PROVIDER_SITE_OTHER): Payer: Self-pay | Admitting: Infectious Diseases

## 2015-04-15 ENCOUNTER — Encounter: Payer: Self-pay | Admitting: Infectious Diseases

## 2015-04-15 VITALS — BP 150/94 | HR 93 | Temp 97.9°F | Ht 63.5 in | Wt 170.0 lb

## 2015-04-15 DIAGNOSIS — Z23 Encounter for immunization: Secondary | ICD-10-CM

## 2015-04-15 DIAGNOSIS — F32A Depression, unspecified: Secondary | ICD-10-CM

## 2015-04-15 DIAGNOSIS — B20 Human immunodeficiency virus [HIV] disease: Secondary | ICD-10-CM

## 2015-04-15 DIAGNOSIS — F329 Major depressive disorder, single episode, unspecified: Secondary | ICD-10-CM

## 2015-04-15 DIAGNOSIS — O169 Unspecified maternal hypertension, unspecified trimester: Secondary | ICD-10-CM | POA: Insufficient documentation

## 2015-04-15 DIAGNOSIS — Z79899 Other long term (current) drug therapy: Secondary | ICD-10-CM

## 2015-04-15 DIAGNOSIS — Z113 Encounter for screening for infections with a predominantly sexual mode of transmission: Secondary | ICD-10-CM

## 2015-04-15 DIAGNOSIS — I10 Essential (primary) hypertension: Secondary | ICD-10-CM

## 2015-04-15 MED ORDER — EMTRICITABINE-TENOFOVIR AF 200-25 MG PO TABS
1.0000 | ORAL_TABLET | Freq: Every day | ORAL | Status: DC
Start: 2015-04-15 — End: 2015-07-18

## 2015-04-15 MED ORDER — ATAZANAVIR-COBICISTAT 300-150 MG PO TABS: 1.0000 | ORAL_TABLET | Freq: Every day | ORAL | Status: DC

## 2015-04-15 NOTE — Assessment & Plan Note (Signed)
Will get her into mental health in the clinic

## 2015-04-15 NOTE — Assessment & Plan Note (Signed)
Will f/u with her primary

## 2015-04-15 NOTE — Progress Notes (Signed)
   Subjective:    Patient ID: Tonya Weeks, female    DOB: 07/12/1990, [age] y.o.   MRN: [id number]  HPI 25 yo F with hx of HIV+ on ATVr/TRV.  Genotype 02-2015 naive.   HIV 1 RNA QUANT (copies/mL)  Date Value  03/21/2015 13391*  10/03/2013 30*  07/18/2013 14755*   CD4 T CELL ABS (/uL)  Date Value  03/21/2015 410  10/03/2013 570  05/15/2013 440    States she is feeling better, was depressed. Does not describe any further than that, "life".  Has been off ART for couple of months. Has renewed her ADAP.  Last PAP was ~ 1 year per pt. (09-2013) Exercising, wearing seat belt.  Review of Systems  Constitutional: Negative for appetite change and unexpected weight change.  Respiratory: Negative for cough and shortness of breath.   Gastrointestinal: Negative for diarrhea and constipation.  Genitourinary: Negative for menstrual problem.       Objective:   Physical Exam  Constitutional: She appears well-developed and well-nourished.  HENT:  Mouth/Throat: No oropharyngeal exudate.  Eyes: EOM are normal. Pupils are equal, round, and reactive to light.  Neck: Neck supple.  Cardiovascular: Normal rate, regular rhythm and normal heart sounds.   Pulmonary/Chest: Effort normal and breath sounds normal.  Abdominal: Soft. Bowel sounds are normal. There is no tenderness. There is no rebound.  Lymphadenopathy:    She has no cervical adenopathy.  Psychiatric: She has a normal mood and affect.      Assessment & Plan:

## 2015-04-15 NOTE — Assessment & Plan Note (Addendum)
Will start HPV vax series.  Will change her meds to descovy/ATVc Will get her in with [facility] Offered/refused condoms.  Will get her PAP rtc 2-3 months

## 2015-04-29 ENCOUNTER — Telehealth: Payer: Self-pay

## 2015-04-29 NOTE — Telephone Encounter (Signed)
Left msg for pt reminding her to submit outstanding docs from 03/21/15 & 04/15/15. Also req sons name so that it can be submitted do aid with financial liability

## 2015-07-18 ENCOUNTER — Other Ambulatory Visit: Payer: Self-pay | Admitting: *Deleted

## 2015-07-18 DIAGNOSIS — B20 Human immunodeficiency virus [HIV] disease: Secondary | ICD-10-CM

## 2015-07-18 MED ORDER — DARUNAVIR-COBICISTAT 800-150 MG PO TABS: 1.0000 | ORAL_TABLET | Freq: Every day | ORAL | Status: DC

## 2015-07-18 MED ORDER — EMTRICITABINE-TENOFOVIR AF 200-25 MG PO TABS: 1.0000 | ORAL_TABLET | Freq: Every day | ORAL | Status: DC

## 2015-08-05 ENCOUNTER — Encounter: Payer: Self-pay | Admitting: Internal Medicine

## 2015-08-15 ENCOUNTER — Ambulatory Visit: Payer: Self-pay | Admitting: Internal Medicine

## 2015-08-30 ENCOUNTER — Telehealth: Payer: Self-pay | Admitting: *Deleted

## 2015-08-30 NOTE — Telephone Encounter (Signed)
No one here on Friday for [facility]. Was just trying to get this patient medication prior to the weekend.

## 2015-08-30 NOTE — Telephone Encounter (Signed)
[facility] for correct regimen on this patient. Last Rx was for Descovy and Evotaz and med list states Descovy and Prescobix. Please advise on correct regimen. Office note had abbreviations on regimen. Wendall MolaJacqueline Rip Hawes

## 2015-08-30 NOTE — Telephone Encounter (Signed)
Did [facility] this patient? If so they might have swwapped Prezcobix for Evotaz since you cannot get [facility]az from [facility]?

## 2015-09-02 ENCOUNTER — Other Ambulatory Visit: Payer: Self-pay | Admitting: *Deleted

## 2015-09-02 DIAGNOSIS — B20 Human immunodeficiency virus [HIV] disease: Secondary | ICD-10-CM

## 2015-09-02 MED ORDER — ATAZANAVIR-COBICISTAT 300-150 MG PO TABS: 1.0000 | ORAL_TABLET | Freq: Every day | ORAL | Status: DC

## 2015-09-02 MED ORDER — EMTRICITABINE-TENOFOVIR AF 200-25 MG PO TABS: 1.0000 | ORAL_TABLET | Freq: Every day | ORAL | Status: DC

## 2015-09-02 NOTE — Telephone Encounter (Signed)
[facility] Thank you Wendall MolaJacqueline Darrin Apodaca

## 2015-09-02 NOTE — Telephone Encounter (Signed)
This patient should be on atazanavir-cobicistat, descovy Thanks jeff

## 2015-09-02 NOTE — Telephone Encounter (Signed)
Dr. Ninetta LightsHatcher clarified regimen and Rx sent.

## 2015-09-02 NOTE — Telephone Encounter (Signed)
I am ok with either regimen Trey PaulaJeff

## 2015-09-03 NOTE — Telephone Encounter (Signed)
Ok very good.

## 2017-01-20 DIAGNOSIS — A539 Syphilis, unspecified: Secondary | ICD-10-CM | POA: Insufficient documentation

## 2017-01-20 DIAGNOSIS — D573 Sickle-cell trait: Secondary | ICD-10-CM | POA: Insufficient documentation

## 2017-01-20 DIAGNOSIS — O99019 Anemia complicating pregnancy, unspecified trimester: Secondary | ICD-10-CM | POA: Insufficient documentation

## 2017-01-20 DIAGNOSIS — Z3490 Encounter for supervision of normal pregnancy, unspecified, unspecified trimester: Secondary | ICD-10-CM | POA: Insufficient documentation

## 2017-01-20 LAB — OB RESULTS CONSOLE GC/CHLAMYDIA
Chlamydia: NEGATIVE
Gonorrhea: NEGATIVE

## 2017-01-20 LAB — OB RESULTS CONSOLE RPR: RPR: REACTIVE

## 2017-01-20 LAB — OB RESULTS CONSOLE HIV ANTIBODY (ROUTINE TESTING): HIV: REACTIVE

## 2017-01-20 LAB — OB RESULTS CONSOLE RUBELLA ANTIBODY, IGM: RUBELLA: IMMUNE

## 2017-01-20 LAB — OB RESULTS CONSOLE HEPATITIS B SURFACE ANTIGEN: HEP B S AG: NEGATIVE

## 2017-01-29 ENCOUNTER — Other Ambulatory Visit: Payer: Medicaid Other

## 2017-01-29 DIAGNOSIS — B20 Human immunodeficiency virus [HIV] disease: Secondary | ICD-10-CM

## 2017-01-29 DIAGNOSIS — Z113 Encounter for screening for infections with a predominantly sexual mode of transmission: Secondary | ICD-10-CM

## 2017-01-29 LAB — T-HELPER CELL (CD4) - (RCID CLINIC ONLY)
CD4 % Helper T Cell: 10 % — ABNORMAL LOW (ref 33–55)
CD4 T Cell Abs: 300 /uL — ABNORMAL LOW (ref 400–2700)

## 2017-02-01 LAB — COMPLETE METABOLIC PANEL WITH GFR
AG RATIO: 1 (calc) (ref 1.0–2.5)
ALBUMIN MSPROF: 3.8 g/dL (ref 3.6–5.1)
ALKALINE PHOSPHATASE (APISO): 51 U/L (ref 33–115)
ALT: 8 U/L (ref 6–29)
AST: 13 U/L (ref 10–30)
BILIRUBIN TOTAL: 1.5 mg/dL — AB (ref 0.2–1.2)
BUN / CREAT RATIO: 7 (calc) (ref 6–22)
BUN: 4 mg/dL — ABNORMAL LOW (ref 7–25)
CHLORIDE: 106 mmol/L (ref 98–110)
CO2: 19 mmol/L — ABNORMAL LOW (ref 20–32)
Calcium: 9.4 mg/dL (ref 8.6–10.2)
Creat: 0.54 mg/dL (ref 0.50–1.10)
GFR, Est African American: 150 mL/min/{1.73_m2} (ref >=60)
GFR, Est Non African American: 129 mL/min/{1.73_m2} (ref >=60)
GLOBULIN: 3.7 g/dL (ref 1.9–3.7)
Glucose, Bld: 82 mg/dL (ref 65–99)
Potassium: 4 mmol/L (ref 3.5–5.3)
SODIUM: 136 mmol/L (ref 135–146)
Total Protein: 7.5 g/dL (ref 6.1–8.1)

## 2017-02-01 LAB — CBC WITH DIFFERENTIAL/PLATELET
Basophils Absolute: 13 cells/uL (ref 0–200)
Basophils Relative: 0.2 %
EOS ABS: 53 {cells}/uL (ref 15–500)
Eosinophils Relative: 0.8 %
HCT: 31.7 % — ABNORMAL LOW (ref 35.0–45.0)
Hemoglobin: 10.7 g/dL — ABNORMAL LOW (ref 11.7–15.5)
Lymphs Abs: 2759 cells/uL (ref 850–3900)
MCH: 26.2 pg — AB (ref 27.0–33.0)
MCHC: 33.8 g/dL (ref 32.0–36.0)
MCV: 77.5 fL — AB (ref 80.0–100.0)
MPV: 10.5 fL (ref 7.5–12.5)
Monocytes Relative: 7.9 %
NEUTROS PCT: 49.3 %
Neutro Abs: 3254 cells/uL (ref 1500–7800)
PLATELETS: 252 10*3/uL (ref 140–400)
RBC: 4.09 10*6/uL (ref 3.80–5.10)
RDW: 19.6 % — AB (ref 11.0–15.0)
TOTAL LYMPHOCYTE: 41.8 %
WBC: 6.6 10*3/uL (ref 3.8–10.8)
WBCMIX: 521 {cells}/uL (ref 200–950)

## 2017-02-01 LAB — FLUORESCENT TREPONEMAL AB(FTA)-IGG-BLD: FLUORESCENT TREPONEMAL ABS: REACTIVE — AB

## 2017-02-01 LAB — RPR TITER: RPR Titer: 1:4 {titer} — ABNORMAL HIGH

## 2017-02-01 LAB — RPR: RPR: REACTIVE — AB

## 2017-02-09 LAB — HIV-1 RNA ULTRAQUANT REFLEX TO GENTYP+
HIV 1 RNA QUANT: 410 {copies}/mL — AB
HIV-1 RNA Quant, Log: 2.61 Log cps/mL — ABNORMAL HIGH

## 2017-02-09 LAB — RFLX HIV-1 INTEGRASE GENOTYPE: HIV-1 GENOTYPE: DETECTED — AB

## 2017-02-11 ENCOUNTER — Ambulatory Visit (INDEPENDENT_AMBULATORY_CARE_PROVIDER_SITE_OTHER): Payer: Medicaid Other | Admitting: Infectious Diseases

## 2017-02-11 ENCOUNTER — Encounter: Payer: Self-pay | Admitting: Infectious Diseases

## 2017-02-11 VITALS — BP 133/79 | HR 99 | Temp 98.3°F

## 2017-02-11 DIAGNOSIS — Z0000 Encounter for general adult medical examination without abnormal findings: Secondary | ICD-10-CM

## 2017-02-11 DIAGNOSIS — B20 Human immunodeficiency virus [HIV] disease: Secondary | ICD-10-CM

## 2017-02-11 DIAGNOSIS — Z202 Contact with and (suspected) exposure to infections with a predominantly sexual mode of transmission: Secondary | ICD-10-CM

## 2017-02-11 DIAGNOSIS — O98913 Unspecified maternal infectious and parasitic disease complicating pregnancy, third trimester: Secondary | ICD-10-CM | POA: Insufficient documentation

## 2017-02-11 DIAGNOSIS — O98912 Unspecified maternal infectious and parasitic disease complicating pregnancy, second trimester: Secondary | ICD-10-CM | POA: Diagnosis not present

## 2017-02-11 DIAGNOSIS — Z23 Encounter for immunization: Secondary | ICD-10-CM

## 2017-02-11 MED ORDER — DOLUTEGRAVIR SODIUM 50 MG PO TABS: 50.0000 mg | ORAL_TABLET | Freq: Every day | ORAL | 5 refills | Status: DC

## 2017-02-11 NOTE — Patient Instructions (Signed)
STOP taking your Evotaz.   Starting you on a new pill once a day called Tivicay. Take this at the same time every day with your Descovy.   Please come back in 4 weeks so we can recheck your viral load and how you are doing on the medications.   Please sign up with MyChart to access your labs and set up email communication with our clinic for non-urgent medical concerns.   Nice to meet you! Please call/send MyChart message if you continue having problems with your medications.

## 2017-02-11 NOTE — Progress Notes (Signed)
Tonya Weeks [place]  06/06/1990 [id number] Patient, No Pcp Per   Reason for Visit: Routine HIV Care    Brief Narrative: Tonya Weeks is a 26 y.o. AA female with HIV infection. Originally diagnosed with HIV in 2013 during her first pregnancy. Diagnosed with syphilis at that time as well (RPR 1:64). Pregnant with her second child - 18 weeks  Genotype 2016: wild type History of OIs: none  Patient Active Problem List   Diagnosis Date Noted  . Pregnancy and infectious disease, second trimester 02/11/2017  . Essential hypertension 04/15/2015  . Encounter for long-term (current) use of other medications 10/12/2013  . Screening examination for venereal disease 10/12/2013  . 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 Depression 09/05/2012  . Encounter for contraceptive management 06/30/2012  . Syphilis contact, treated 01/07/2012  . HIV disease (HCC) 09/01/2011    HPI:  HIV =  Has not been seen since January 2017 when she saw Dr. Ninetta LightsHatcher. She tells me she was given permission for once a year visits. She cannot recall any previous labs, uncertain if she has ever been undetectable and tells me the medication we have on file is not what she is taking. After further discussion it is actually the same as what we have on file - EVOTAZ and DESCOVY. Reports she has had great difficulty keeping these medications down over the last 1-2 months and feels it is d/t pregnancy. She is constantly throwing up, has abdominal discomfort and just "feels sick". She has not been able to keep it down much and over the course of a week she takes it maybe 1 - 3 times at most with her last dose Monday. Endorses no complaints today suggestive of associated opportunistic infection or advancing HIV disease such as fevers, night sweats, weight loss, anorexia, cough, SOB, nausea, vomiting, diarrhea, headache, sensory changes, lymphadenopathy or oral thrush.   Denies any new sexual partners aside from the father of her  children. Tells me he was last tested a year ago and HIV (-) at that time and they have been having unprotected sex.   Pregnancy, Second Trimester = 1tells me she is [redacted] weeks gestation and just entered prenatal care with [facility]. She does not know the gender and thinks she wants to be surprised. Has a 15100 year old son at home. Same FOB. She is taking Evotaz and Descovy currently, however   History of Syphilis = Diagnosed 2013 - upon record review only see she was treated with 1 Bicillin injection. Worried about syphilis currently and asking about checking her for this.    Adjustment Disorder = has had a difficult time with 'public people seeing she has HIV' and likes her visits in the morning and to be escorted quickly into room and out per the side of the building. Has tried in the past attending support groups but were not found to be helpful. "Just something I cannot get past."   Health Maintenance = PAP Smear obtained recently with OB - does not know results of cervical testing but was + yeast and has been treated. Has not had flu vaccine. Does not endorse any weight loss.    Review of Systems: Review of Systems  Constitutional: Negative for chills, fever, malaise/fatigue and weight loss.  HENT: Negative for sore throat.        No dental problems  Respiratory: Negative for cough and sputum production.   Cardiovascular: Negative for chest pain and leg swelling.  Gastrointestinal:  Positive for nausea and vomiting. Negative for abdominal pain and diarrhea.  Genitourinary: Negative for dysuria and flank pain.  Musculoskeletal: Negative for joint pain, myalgias and neck pain.  Skin: Negative for rash.  Neurological: Negative for dizziness, tingling and headaches.  Psychiatric/Behavioral: Negative for depression and substance abuse. The patient is not nervous/anxious and does not have insomnia.     Past Medical History:  Diagnosis Date  . HIV (human  immunodeficiency virus infection) (HCC)   . HIV infection (HCC)   . Sickle cell trait (HCC)   . Syphilis    No Known Allergies  Social History   Tobacco Use  . Smoking status: Never Smoker  . Smokeless tobacco: Never Used  Substance Use Topics  . Alcohol use: Yes    Comment: Socially   . Drug use: No   Family History  Problem Relation Age of Onset  . Lupus Mother   . Hypertension Mother   . Heart failure Mother   . Heart attack Mother   . Sickle cell trait Mother   . Scleroderma Mother   . Hypertension Father   . Arthritis Maternal Grandmother   . Diabetes Maternal Grandmother   . Hypertension Maternal Grandmother   . Stroke Maternal Grandmother   . Heart failure Maternal Grandmother   . Heart attack Maternal Grandmother   . Asthma Maternal Grandmother   . Arthritis Maternal Grandfather   . Diabetes Maternal Grandfather   . Hypertension Maternal Grandfather   . Stroke Maternal Grandfather   . Heart failure Maternal Grandfather   . Heart attack Maternal Grandfather   . Kidney disease Maternal Grandfather   . Other Neg Hx    Social History   Substance and Sexual Activity  Sexual Activity No  . Partners: Male    Physical Exam and Objective Findings:  Vitals:   02/11/17 0856  BP: 133/79  Pulse: 99  Temp: 98.3 F (36.8 C)  TempSrc: Oral   There is no height or weight on file to calculate BMI.  Physical Exam  Constitutional: She is oriented to person, place, and time and well-developed, well-nourished, and in no distress.  Well-appearing young AA woman. Does not like to be seen in clinic by other patients, wearing large sweatshirt to hide herself.   HENT:  Mouth/Throat: Oropharynx is clear and moist. No oral lesions. No dental abscesses.  Eyes: No scleral icterus.  Cardiovascular: Normal rate, regular rhythm and normal heart sounds.  Pulmonary/Chest: Effort normal and breath sounds normal.  Abdominal: Soft. She exhibits no distension. There is no  tenderness.  Musculoskeletal: Normal range of motion. She exhibits no tenderness.  Lymphadenopathy:    She has no cervical adenopathy.  Neurological: She is alert and oriented to person, place, and time.  Skin: Skin is warm and dry. No rash noted.  Psychiatric: Mood, affect and judgment normal.     Lab Results Lab Results  Component Value Date   WBC 6.6 01/29/2017   HGB 10.7 (L) 01/29/2017   HCT 31.7 (L) 01/29/2017   MCV 77.5 (L) 01/29/2017   PLT 252 01/29/2017    Lab Results  Component Value Date   CREATININE 0.54 01/29/2017   BUN 4 (L) 01/29/2017   NA 136 01/29/2017   K 4.0 01/29/2017   CL 106 01/29/2017   CO2 19 (L) 01/29/2017    Lab Results  Component Value Date   ALT 8 01/29/2017   AST 13 01/29/2017   ALKPHOS 62 03/21/2015   BILITOT 1.5 (H) 01/29/2017  Lab Results  Component Value Date   CHOL 104 01/30/2013   HDL 34 (L) 01/30/2013   LDLCALC 43 01/30/2013   TRIG 134 01/30/2013   CHOLHDL 3.1 01/30/2013   HIV 1 RNA Quant  Date Value  01/29/2017 410 Copies/mL (H)  03/21/2015 13,391 copies/mL (H)  10/03/2013 30 copies/mL (H)   CD4 T Cell Abs (/uL)  Date Value  01/29/2017 300 (L)  03/21/2015 410  10/03/2013 570   ASSESSMENT & PLAN:    Problem List Items Addressed This Visit      Other   HIV disease (HCC)    She has not been well controlled and out of care. Will stop Evotaz. Will start Tivicay / Descovy. She is past the risk for neural tube defects at this point in her pregnancy if there is any true association with dolutegravir so will see if she tolerates this regimen better. Could consider BID Isentress if she does not do well with this - however I worry about adherence with BID dosing.   Will have her return in 1 month to recheck VL and tolerability of new regimen. Explained she will need to follow up closely while pregnant to ensure viral suppression. Her recent VL was 450. Counseld today on the importance of medication adherence and understanding of  her lab work. I have also asked her husband to come in for partner testing to ensure he is not infected.       Relevant Medications   dolutegravir (TIVICAY) 50 MG tablet   Syphilis contact, treated    She has had varying titers since original treatment and I don't believe had the full suggested 3 shot series from what I can tell. I do feel it is best that considering risk for possible perinatal transmission she should be retreated. She "needs to be prepared for the shot" and will get them at the next appointment.       Pregnancy and infectious disease, second trimester    Will have her come back frequently to adjust medications throughout her pregnancy as needed and monitor viral loads. I have called her OB/GYN to request her last office note as she is not certain about many details of her care at this point. She is not taking a prenatal vitamin - I have encouraged her to get prenatal with B6 to help with nausea and tolerability of it. I have asked her to separate the dose time by at least 4 hours to allow for maximal absorption of ART.       Relevant Medications   dolutegravir (TIVICAY) 50 MG tablet    Other Visit Diagnoses    HIV (human immunodeficiency virus infection) (HCC)    -  Primary   Relevant Medications   dolutegravir (TIVICAY) 50 MG tablet   Need for immunization against influenza       Relevant Orders   Flu Vaccine QUAD 36+ mos IM (Completed)      Meds ordered this encounter  Medications  . dolutegravir (TIVICAY) 50 MG tablet    Sig: Take 1 tablet (50 mg total) daily by mouth.    Dispense:  30 tablet    Refill:  5    Patient would like to pick this medication up in person today but at next refill please ship with Descovy per her normal schedule.    Order Specific Question:   Supervising Provider    Answer:   HATCHER, JEFFREY C [[id number]]    Tonya Alberts, MSN, NP-C [facility] for Infectious  Disease [facility] Pager: [phone]  02/12/2017    3:37 PM

## 2017-02-11 NOTE — Assessment & Plan Note (Signed)
Will have her come back frequently to adjust medications throughout her pregnancy as needed and monitor viral loads. I have called her OB/GYN to request her last office note as she is not certain about many details of her care at this point. She is not taking a prenatal vitamin - I have encouraged her to get prenatal with B6 to help with nausea and tolerability of it. I have asked her to separate the dose time by at least 4 hours to allow for maximal absorption of ART.

## 2017-02-11 NOTE — Assessment & Plan Note (Addendum)
She has not been well controlled and out of care. Will stop Evotaz. Will start Tivicay / Descovy. She is past the risk for neural tube defects at this point in her pregnancy if there is any true association with dolutegravir so will see if she tolerates this regimen better. Could consider BID Isentress if she does not do well with this - however I worry about adherence with BID dosing.   Will have her return in 1 month to recheck VL and tolerability of new regimen. Explained she will need to follow up closely while pregnant to ensure viral suppression. Her recent VL was 450. Counseld today on the importance of medication adherence and understanding of her lab work. I have also asked her husband to come in for partner testing to ensure he is not infected.

## 2017-02-12 NOTE — Assessment & Plan Note (Signed)
Flu shot today - counseling provided on importance of this vaccination in pregnancy.

## 2017-02-12 NOTE — Assessment & Plan Note (Signed)
She has had varying titers since original treatment and I don't believe had the full suggested 3 shot series from what I can tell. I do feel it is best that considering risk for possible perinatal transmission she should be retreated. She "needs to be prepared for the shot" and will get them at the next appointment.

## 2017-03-09 ENCOUNTER — Encounter: Payer: Self-pay | Admitting: Infectious Diseases

## 2017-03-16 ENCOUNTER — Ambulatory Visit: Payer: Self-pay | Admitting: Infectious Diseases

## 2017-03-30 NOTE — L&D Delivery Note (Addendum)
Pt was admitted yesterday. She progressed slowly and was started on pit. At 8AM today she was 9cm. She had an amniotomy with clear fluid. She had been having decels earlier and pit was discontinued. She quickly completed the first stage after amniotomy. She pushed 4 times and had a SVD over an intact perineum. Delivered ROA. No tears. Placenta- S/I, small. Sent to path. Cord gas sent. [id number]

## 2017-04-06 ENCOUNTER — Ambulatory Visit: Payer: Self-pay | Admitting: Infectious Diseases

## 2017-04-09 ENCOUNTER — Ambulatory Visit (INDEPENDENT_AMBULATORY_CARE_PROVIDER_SITE_OTHER): Payer: Medicaid Other | Admitting: Infectious Diseases

## 2017-04-09 ENCOUNTER — Encounter: Payer: Self-pay | Admitting: Infectious Diseases

## 2017-04-09 VITALS — BP 143/97 | HR 101 | Ht 64.0 in | Wt 192.0 lb

## 2017-04-09 DIAGNOSIS — Z3492 Encounter for supervision of normal pregnancy, unspecified, second trimester: Secondary | ICD-10-CM | POA: Diagnosis not present

## 2017-04-09 DIAGNOSIS — O10013 Pre-existing essential hypertension complicating pregnancy, third trimester: Secondary | ICD-10-CM

## 2017-04-09 DIAGNOSIS — Z202 Contact with and (suspected) exposure to infections with a predominantly sexual mode of transmission: Secondary | ICD-10-CM

## 2017-04-09 DIAGNOSIS — B20 Human immunodeficiency virus [HIV] disease: Secondary | ICD-10-CM

## 2017-04-09 LAB — COMPREHENSIVE METABOLIC PANEL
AG Ratio: 1 (calc) (ref 1.0–2.5)
ALBUMIN MSPROF: 3.7 g/dL (ref 3.6–5.1)
ALKALINE PHOSPHATASE (APISO): 76 U/L (ref 33–115)
ALT: 11 U/L (ref 6–29)
AST: 17 U/L (ref 10–30)
BUN/Creatinine Ratio: 6 (calc) (ref 6–22)
BUN: 3 mg/dL — ABNORMAL LOW (ref 7–25)
CHLORIDE: 104 mmol/L (ref 98–110)
CO2: 22 mmol/L (ref 20–32)
CREATININE: 0.51 mg/dL (ref 0.50–1.10)
Calcium: 9.2 mg/dL (ref 8.6–10.2)
GLOBULIN: 3.7 g/dL (ref 1.9–3.7)
Glucose, Bld: 81 mg/dL (ref 65–99)
POTASSIUM: 3.9 mmol/L (ref 3.5–5.3)
SODIUM: 134 mmol/L — AB (ref 135–146)
TOTAL PROTEIN: 7.4 g/dL (ref 6.1–8.1)
Total Bilirubin: 0.2 mg/dL (ref 0.2–1.2)

## 2017-04-09 LAB — T-HELPER CELL (CD4) - (RCID CLINIC ONLY)
CD4 T CELL HELPER: 10 % — AB (ref 33–55)
CD4 T Cell Abs: 210 /uL — ABNORMAL LOW (ref 400–2700)

## 2017-04-09 NOTE — Assessment & Plan Note (Signed)
Further review of RPR titers she has been serofast with RPR titer 1:1 - 1:4. We discussed checking RPR again later in the 3rd trimester.

## 2017-04-09 NOTE — Progress Notes (Signed)
Tonya Weeks  09-21-1990 [id number] Patient, No Pcp Per  OB:   Reason for Visit: Routine HIV Care    Brief Narrative: Tonya Weeks is a 27 y.o. AA female with HIV infection. She is currently pregnant with her second child due 07/16/17. Originally diagnosed with HIV in 2013 during her first pregnancy. Diagnosed with syphilis at that time as well (RPR 1:64). HIV Risk: heterosexual unprotected contacts. She is OI Hx: none Genotype: 01/2017 - E138A (R-rilpivirine)   Patient Active Problem List   Diagnosis Date Noted  . Pregnancy and infectious disease, second trimester 02/11/2017  . Hypertension in pregnancy 04/15/2015  . Encounter for long-term (current) use of other medications 10/12/2013  . Health care maintenance 10/12/2013  . 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 Depression 09/05/2012  . Syphilis contact, treated 01/07/2012  . HIV disease ([place]) 09/01/2011   HPI:  Tonya Weeks is feeling well today. She has had no further problems with vomiting her HIV medications since we switched her to [place]. She tells me she has not missed any doses of this medication. She is 26 weeks today and pregnancy is going well from what she tells me although having a difficult time sleeping. She is taking a prenatal vitamin every day now. She still would like to wait to find out the gender until labor. Previously she required induction and IV zidovudine during labor and she is very concerned about the need for IV ART again for fear of someone finding out her condition.   Review of Systems: Review of Systems  Constitutional: Negative for chills, fever, malaise/fatigue and weight loss.  HENT: Negative for sore throat.        No dental problems  Respiratory: Negative for cough and sputum production.   Cardiovascular: Negative for chest pain and leg swelling.  Gastrointestinal: Negative for abdominal pain, diarrhea, nausea and vomiting.  Genitourinary: Negative for dysuria and flank  pain.  Musculoskeletal: Negative for joint pain, myalgias and neck pain.  Skin: Negative for rash.  Neurological: Negative for dizziness, tingling and headaches.  Psychiatric/Behavioral: Negative for depression and substance abuse. The patient has insomnia. The patient is not nervous/anxious.    Past Medical History:  Diagnosis Date  . History of syphilis    Treated during last pregnancy   . HIV (human immunodeficiency virus infection) ([place])   . HIV infection ([place])   . Sickle cell trait (H[facility])    No Known Allergies  Social History   Tobacco Use  . Smoking status: Never Smoker  . Smokeless tobacco: Never Used  Substance Use Topics  . Alcohol use: Yes    Comment: Socially   . Drug use: No   Family History  Problem Relation Age of Onset  . Lupus Mother   . Hypertension Mother   . Heart failure Mother   . Heart attack Mother   . Sickle cell trait Mother   . Scleroderma Mother   . Hypertension Father   . Arthritis Maternal Grandmother   . Diabetes Maternal Grandmother   . Hypertension Maternal Grandmother   . Stroke Maternal Grandmother   . Heart failure Maternal Grandmother   . Heart attack Maternal Grandmother   . Asthma Maternal Grandmother   . Arthritis Maternal Grandfather   . Diabetes Maternal Grandfather   . Hypertension Maternal Grandfather   . Stroke Maternal Grandfather   . Heart failure Maternal Grandfather   . Heart attack Maternal Grandfather   . Kidney disease Maternal Grandfather   . Other  Neg Hx    Social History   Substance and Sexual Activity  Sexual Activity No  . Partners: Male    Physical Exam and Objective Findings:  Vitals:   04/09/17 0941  BP: (!) 143/97  Pulse: (!) 101  Weight: 192 lb (87.1 kg)  Height: _0  (1.626 m)   Body mass index is 32.96 kg/m.  Physical Exam  Constitutional: She is oriented to person, place, and time and well-developed, well-nourished, and in no distress.  Well-appearing young AA woman. Does not like to  be seen in clinic by other patients, wearing large sweatshirt to hide herself.   HENT:  Mouth/Throat: Oropharynx is clear and moist. No oral lesions. No dental abscesses.  Eyes: No scleral icterus.  Cardiovascular: Normal rate, regular rhythm and normal heart sounds.  Pulmonary/Chest: Effort normal and breath sounds normal.  Abdominal: Soft. She exhibits no distension. There is no tenderness.  Musculoskeletal: Normal range of motion. She exhibits no tenderness.  Lymphadenopathy:    She has no cervical adenopathy.  Neurological: She is alert and oriented to person, place, and time.  Skin: Skin is warm and dry. No rash noted.  Psychiatric: Mood, affect and judgment normal.     Lab Results Lab Results  Component Value Date   WBC 6.6 01/29/2017   HGB 10.7 (L) 01/29/2017   HCT 31.7 (L) 01/29/2017   MCV 77.5 (L) 01/29/2017   PLT 252 01/29/2017    Lab Results  Component Value Date   CREATININE 0.54 01/29/2017   BUN 4 (L) 01/29/2017   NA 136 01/29/2017   K 4.0 01/29/2017   CL 106 01/29/2017   CO2 19 (L) 01/29/2017    Lab Results  Component Value Date   ALT 8 01/29/2017   AST 13 01/29/2017   ALKPHOS 62 03/21/2015   BILITOT 1.5 (H) 01/29/2017    Lab Results  Component Value Date   CHOL 104 01/30/2013   HDL 34 (L) 01/30/2013   LDLCALC 43 01/30/2013   TRIG 134 01/30/2013   CHOLHDL 3.1 01/30/2013   HIV 1 RNA Quant  Date Value  01/29/2017 410 Copies/mL (H)  03/21/2015 13,391 copies/mL (H)  10/03/2013 30 copies/mL (H)   CD4 T Cell Abs (/uL)  Date Value  01/29/2017 300 (L)  03/21/2015 410  10/03/2013 570   ASSESSMENT & PLAN:    Problem List Items Addressed This Visit      Cardiovascular and Mediastinum   Hypertension in pregnancy    BP's reviewed from recent OB visit and are normally under better control compared to today's value of 143/97. No vision changes or headache today. Monitoring per OB team for consideration of pre-eclampsia.         Other   HIV disease  ([place])    Check VL/CD4 today. Continue Tivicay and Descovy (she has been on Descovy since early pregnancy). We had a long discussion about nearly zero risk of HIV transmission to baby in the setting of virologic suppression. Based on the current DHHS Guidelines regarding antepartum/intrapartum care of women living with HIV with viral load < 50 copies and good evidence to support adherence of ARTs IV zidovudine is not necessary. However if Tonya Weeks continues to miss appointments and does not show for labs we are forced to recommend IV ART or scheduled c/section if her VL is > 1000 copies. I recommended for her to consider not having anyone in her room except the people that already know of her diagnosis so she can focus on labor  and delivery without worry. This is clearly a huge barrier to her retention in care - in the future she may need to go to a clinic outside the [place] area for comfort. Will discuss further after delivery.   She will have Q4-6 week VLs until delivery to ensure confidence about her adherence. May need to increase tivicay to BID in third trimester.       Syphilis contact, treated    Further review of RPR titers she has been serofast with RPR titer 1:1 - 1:4. We discussed checking RPR again later in the 3rd trimester.        Other Visit Diagnoses    HIV (human immunodeficiency virus infection) ([place])    -  Primary   Relevant Orders   HIV 1 RNA quant-no reflex-bld   T-helper cell (CD4)- ([facility] clinic only)   Comp Met ([facility])   HIV 1 RNA quant-no reflex-bld   RPR   Second trimester pregnancy         I spent 25 minutes with greater than 50% of the time in face to face counsel with the patient in discussion of her above conditions and in coordination of her plan surrounding HIV and delivery of her child.   Janene Madeira, MSN, NP-C [facility] Pager: [phone]  04/09/2017  2:43 PM

## 2017-04-09 NOTE — Assessment & Plan Note (Signed)
Check VL/CD4 today. Continue Tivicay and Descovy (she has been on Descovy since early pregnancy). We had a long discussion about nearly zero risk of HIV transmission to baby in the setting of virologic suppression. Based on the current DHHS Guidelines regarding antepartum/intrapartum care of women living with HIV with viral load < 50 copies and good evidence to support adherence of ARTs IV zidovudine is not necessary. However if Tonya Weeks continues to miss appointments and does not show for labs we are forced to recommend IV ART or scheduled c/section if her VL is > 1000 copies. I recommended for her to consider not having anyone in her room except the people that already know of her diagnosis so she can focus on labor and delivery without worry. This is clearly a huge barrier to her retention in care - in the future she may need to go to a clinic outside the [place] area for comfort. Will discuss further after delivery.   She will have Q4-6 week VLs until delivery to ensure confidence about her adherence. May need to increase tivicay to BID in third trimester.

## 2017-04-09 NOTE — Assessment & Plan Note (Signed)
BP's reviewed from recent OB visit and are normally under better control compared to today's value of 143/97. No vision changes or headache today. Monitoring per OB team for consideration of pre-eclampsia.

## 2017-04-13 LAB — HIV-1 RNA QUANT-NO REFLEX-BLD
HIV 1 RNA QUANT: 111 {copies}/mL — AB
HIV-1 RNA QUANT, LOG: 2.05 {Log_copies}/mL — AB

## 2017-04-19 ENCOUNTER — Other Ambulatory Visit: Payer: Self-pay | Admitting: Pharmacist Clinician (PhC)/ Clinical Pharmacy Specialist

## 2017-04-19 DIAGNOSIS — B20 Human immunodeficiency virus [HIV] disease: Secondary | ICD-10-CM

## 2017-04-19 DIAGNOSIS — Z21 Asymptomatic human immunodeficiency virus [HIV] infection status: Secondary | ICD-10-CM

## 2017-04-19 MED ORDER — EMTRICITABINE-TENOFOVIR AF 200-25 MG PO TABS: 1.0000 | ORAL_TABLET | Freq: Every day | ORAL | 5 refills | Status: DC

## 2017-04-19 MED ORDER — DOLUTEGRAVIR SODIUM 50 MG PO TABS: 50.0000 mg | ORAL_TABLET | Freq: Every day | ORAL | 5 refills | Status: DC

## 2017-04-19 NOTE — Progress Notes (Signed)
She would like the meds to be shipped. [facility].

## 2017-04-23 MED FILL — TIVICAY 50 MG TABLET: 50 | 30 days supply | Qty: 30 | Fill #0

## 2017-04-23 MED FILL — DESCOVY 200-25 MG TABS: 200-25 | 30 days supply | Qty: 30 | Fill #0

## 2017-05-18 MED FILL — TIVICAY 50 MG TABLET: 50 | 30 days supply | Qty: 30 | Fill #1

## 2017-05-18 MED FILL — DESCOVY 200-25 MG TABS: 200-25 | 30 days supply | Qty: 30 | Fill #1

## 2017-05-24 ENCOUNTER — Other Ambulatory Visit: Payer: Self-pay

## 2017-05-25 DIAGNOSIS — H40033 Anatomical narrow angle, bilateral: Secondary | ICD-10-CM | POA: Diagnosis not present

## 2017-05-25 DIAGNOSIS — G44209 Tension-type headache, unspecified, not intractable: Secondary | ICD-10-CM | POA: Diagnosis not present

## 2017-06-10 LAB — OB RESULTS CONSOLE GBS: GBS: POSITIVE

## 2017-06-11 ENCOUNTER — Ambulatory Visit (INDEPENDENT_AMBULATORY_CARE_PROVIDER_SITE_OTHER): Payer: Medicaid Other | Admitting: Infectious Diseases

## 2017-06-11 ENCOUNTER — Other Ambulatory Visit: Payer: Self-pay | Admitting: Pharmacist

## 2017-06-11 ENCOUNTER — Encounter: Payer: Self-pay | Admitting: Infectious Diseases

## 2017-06-11 VITALS — BP 131/86 | HR 116 | Temp 98.0°F | Ht 65.0 in | Wt 199.0 lb

## 2017-06-11 DIAGNOSIS — B20 Human immunodeficiency virus [HIV] disease: Secondary | ICD-10-CM

## 2017-06-11 DIAGNOSIS — O98913 Unspecified maternal infectious and parasitic disease complicating pregnancy, third trimester: Secondary | ICD-10-CM | POA: Diagnosis not present

## 2017-06-11 NOTE — Progress Notes (Signed)
Tonya Weeks  18-Mar-1991 [id number] Patient, No Pcp Per  OB: Dr. Dareen Piano [facility])   Chief Complaint  Patient presents with  . Follow-up    HIV care in pregnancy     Patient Active Problem List   Diagnosis Date Noted  . Pregnancy and infectious disease in third trimester 02/11/2017  . Hypertension in pregnancy 04/15/2015  . Encounter for long-term (current) use of other medications 10/12/2013  . Health care maintenance 10/12/2013  . 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 Depression 09/05/2012  . Syphilis contact, treated 01/07/2012  . HIV disease ([facility]) 09/01/2011   SUBJECTIVE: Brief Narrative:  Tonya Weeks is a 27 y.o. AA female with HIV infection. She is currently pregnant with her second child due 07/16/17 and is now [redacted] weeks pregnant. Originally diagnosed with HIV in 2013 during her first pregnancy. Diagnosed with syphilis at that time as well (RPR 1:64). HIV Risk: heterosexual unprotected contacts. She is OI Hx: none  Previous Regimens:  Evotaz + Descovy --> pre pregnancy regimen; evotaz caused vomiting in pregnancy   Tivicay + Descovy --> Tivicay + Truvada: suppressed to 39 copies on   Genotype: 01/2017:  E138A (R-rilpivirine)   HPI:  Tonya Weeks is here today  for routine follow up for her HIV infection during pregnancy. with her 12 yo son for routine follow up for her HIV infection during pregnancy. She is overall feeling fine and tolerating her tivicay + descovy well without vomiting and has missed no doses of her regimen. She is very concerned today that her OB team has brought up a c-section which she would like to avoid if not medically necessary at this point. Also again brings up "covering the bag of medicine" while she is in labor and delivery so no one "finds out about it" while she is at [facility] her child. She is not experiencing any regular contractions at this point and reports good fetal movement. She is starting with weekly visits with her OB team.   Review of Systems    Constitutional: Negative for chills, fever, malaise/fatigue and weight loss.  HENT: Negative for sore throat.        No dental problems  Respiratory: Negative for cough and sputum production.   Cardiovascular: Negative for chest pain and leg swelling.  Gastrointestinal: Negative for abdominal pain, diarrhea, nausea and vomiting.  Genitourinary: Negative for dysuria and flank pain.  Musculoskeletal: Negative for joint pain, myalgias and neck pain.  Skin: Negative for rash.  Neurological: Negative for dizziness, tingling and headaches.  Psychiatric/Behavioral: Negative for depression and substance abuse. The patient is not nervous/anxious and does not have insomnia.    Past Medical History:  Diagnosis Date  . History of syphilis    Treated during last pregnancy   . HIV (human immunodeficiency virus infection) (H[facility])   . HIV infection ([facility])   . Sickle cell trait (H[facility])    No Known Allergies  Social History   Tobacco Use  . Smoking status: Never Smoker  . Smokeless tobacco: Never Used  Substance Use Topics  . Alcohol use: Yes    Comment: Socially   . Drug use: No   Family History  Problem Relation Age of Onset  . Lupus Mother   . Hypertension Mother   . Heart failure Mother   . Heart attack Mother   . Sickle cell trait Mother   . Scleroderma Mother   . Hypertension Father   . Arthritis Maternal Grandmother   . Diabetes Maternal Grandmother   . Hypertension Maternal Grandmother   .  Stroke Maternal Grandmother   . Heart failure Maternal Grandmother   . Heart attack Maternal Grandmother   . Asthma Maternal Grandmother   . Arthritis Maternal Grandfather   . Diabetes Maternal Grandfather   . Hypertension Maternal Grandfather   . Stroke Maternal Grandfather   . Heart failure Maternal Grandfather   . Heart attack Maternal Grandfather   . Kidney disease Maternal Grandfather   . Other Neg Hx    Social History   Substance and Sexual Activity  Sexual Activity Never  .  Partners: Male    Physical Exam and Objective Findings:  Vitals:   06/11/17 0921  BP: 131/86  Pulse: (!) 116  Temp: 98 F (36.7 C)  TempSrc: Oral  Weight: 199 lb (90.3 kg)  Height: 5\' 5"  (1.651 m)   Body mass index is 33.12 kg/m.  Physical Exam  Constitutional: She is oriented to person, place, and time and well-developed, well-nourished, and in no distress.  Well appearing, seated comfortably in chair today.   HENT:  Mouth/Throat: Oropharynx is clear and moist. No oral lesions. No dental abscesses.  Eyes: No scleral icterus.  Cardiovascular: Regular rhythm and normal heart sounds. Tachycardia present.  Pulmonary/Chest: Effort normal and breath sounds normal.  Abdominal: Soft. She exhibits no distension. There is no tenderness.  Musculoskeletal: Normal range of motion. She exhibits no tenderness.  Lymphadenopathy:    She has no cervical adenopathy.  Neurological: She is alert and oriented to person, place, and time.  Skin: Skin is warm and dry. No rash noted.  Psychiatric: Mood, affect and judgment normal.   Lab Results Lab Results  Component Value Date   WBC 6.6 01/29/2017   HGB 10.7 (L) 01/29/2017   HCT 31.7 (L) 01/29/2017   MCV 77.5 (L) 01/29/2017   PLT 252 01/29/2017    Lab Results  Component Value Date   CREATININE 0.51 04/09/2017   BUN 3 (L) 04/09/2017   NA 134 (L) 04/09/2017   K 3.9 04/09/2017   CL 104 04/09/2017   CO2 22 04/09/2017    Lab Results  Component Value Date   ALT 11 04/09/2017   AST 17 04/09/2017   ALKPHOS 62 03/21/2015   BILITOT 0.2 04/09/2017    Lab Results  Component Value Date   CHOL 104 01/30/2013   HDL 34 (L) 01/30/2013   LDLCALC 43 01/30/2013   TRIG 134 01/30/2013   CHOLHDL 3.1 01/30/2013   HIV 1 RNA Quant  Date Value  06/11/2017 39 copies/mL (H)  04/09/2017 111 copies/mL (H)  01/29/2017 410 Copies/mL (H)   CD4 T Cell Abs (/uL)  Date Value  04/09/2017 210 (L)  01/29/2017 300 (L)  03/21/2015 410   ASSESSMENT &  PLAN:    Problem List Items Addressed This Visit      Other   HIV disease (HCC) - Primary    Will change Descovy to Truvada due to better data in pregnancy. Will check her viral load today to ensure continued adequate suppression.       Relevant Orders   HIV 1 RNA quant-no reflex-bld (Completed)   Pregnancy and infectious disease in third trimester    Based on Perinatal and Intrapartum care guidelines would not recommend scheduled c section considering her viral loads have persistently been < 500 copies. I have asked Tonya Weeks to be her own steward for communicating with her medical team her desire to keep her HIV confidential and ask her medical team to conceal the intravenous zidovudine while in labor.  Will reach out to her OB team and discuss once viral load returns.   Rexene AlbertsStephanie Braden Cimo, MSN, NP-C [facility] Pager: [phone]  06/18/2017  4:40 PM

## 2017-06-14 ENCOUNTER — Other Ambulatory Visit: Payer: Self-pay | Admitting: Pharmacist

## 2017-06-14 ENCOUNTER — Telehealth: Payer: Self-pay | Admitting: Infectious Diseases

## 2017-06-14 DIAGNOSIS — B20 Human immunodeficiency virus [HIV] disease: Secondary | ICD-10-CM

## 2017-06-14 MED ORDER — EMTRICITABINE-TENOFOVIR DF 200-300 MG PO TABS: 1.0000 | ORAL_TABLET | Freq: Every day | ORAL | 11 refills | Status: DC

## 2017-06-14 MED FILL — TIVICAY 50 MG TABLET: 50 | 30 days supply | Qty: 30 | Fill #2

## 2017-06-14 MED FILL — DESCOVY 200-25 MG TABS: 200-25 | 30 days supply | Qty: 30 | Fill #2

## 2017-06-14 NOTE — Telephone Encounter (Signed)
Called to inform Tonya Weeks of medication change - instructed to stop descovy and start Truvada. Continue taking Tivicay once daily.   She does not want a C-Section and very adamant about not wanting anyone to know her status and again re-emphasized to me that she wants her antiviral medication covered up.   I will call her when her viral load results and call to discuss with her OB team.   Rexene AlbertsStephanie Tama Grosz, NP

## 2017-06-15 LAB — HIV-1 RNA QUANT-NO REFLEX-BLD
HIV 1 RNA QUANT: 39 {copies}/mL — AB
HIV-1 RNA QUANT, LOG: 1.59 {Log_copies}/mL — AB

## 2017-06-17 ENCOUNTER — Telehealth: Payer: Self-pay | Admitting: *Deleted

## 2017-06-17 NOTE — Telephone Encounter (Signed)
Relayed results to patient. Confirmed that [facility] has prescription for Truvada in place of Descovy.  She had no other questions - stated her OB is "going with the flow." Providence LaniusHowell, Zachary GeorgeMichelle M, RN

## 2017-06-17 NOTE — Telephone Encounter (Signed)
-----   Message from Blanchard KelchStephanie N Dixon, NP sent at 06/17/2017  3:29 PM EDT ----- Can someone please call Apurva to inform her as to what her viral load is - I promised her someone would call to update her when we got the lab when I spoke with her earlier this week?   I am waiting on a call back from her OB to discuss how she DOES NOT need a scheduled c section based on the fact that she has HIV (which is very well controlled) --> I have already discussed this with her and will continue to stress this with her OB team when they give me a call back.   Thank you.

## 2017-06-18 NOTE — Assessment & Plan Note (Signed)
Will change Descovy to Truvada due to better data in pregnancy. Will check her viral load today to ensure continued adequate suppression.

## 2017-06-18 NOTE — Assessment & Plan Note (Signed)
Based on Perinatal and Intrapartum care guidelines would not recommend scheduled c section considering her viral loads have persistently been < 500 copies. I have asked Doshia to be her own steward for communicating with her medical team her desire to keep her HIV confidential and ask her medical team to conceal the intravenous zidovudine while in labor.

## 2017-07-03 ENCOUNTER — Other Ambulatory Visit: Payer: Self-pay | Admitting: Obstetrics and Gynecology

## 2017-07-05 ENCOUNTER — Other Ambulatory Visit: Payer: Self-pay

## 2017-07-05 ENCOUNTER — Encounter: Payer: Self-pay | Admitting: Advanced Practice Midwife

## 2017-07-05 ENCOUNTER — Inpatient Hospital Stay (HOSPITAL_COMMUNITY)
Admission: AD | Admit: 2017-07-05 | Discharge: 2017-07-05 | Disposition: A | Discharge status: Home / Self Care | Payer: Medicaid Other | Source: Ambulatory Visit | Attending: Obstetrics and Gynecology | Admitting: Obstetrics and Gynecology

## 2017-07-05 DIAGNOSIS — O9989 Other specified diseases and conditions complicating pregnancy, childbirth and the puerperium: Secondary | ICD-10-CM | POA: Diagnosis not present

## 2017-07-05 DIAGNOSIS — O212 Late vomiting of pregnancy: Secondary | ICD-10-CM | POA: Insufficient documentation

## 2017-07-05 DIAGNOSIS — R112 Nausea with vomiting, unspecified: Secondary | ICD-10-CM

## 2017-07-05 DIAGNOSIS — R197 Diarrhea, unspecified: Secondary | ICD-10-CM | POA: Diagnosis not present

## 2017-07-05 DIAGNOSIS — Z3A38 38 weeks gestation of pregnancy: Secondary | ICD-10-CM | POA: Insufficient documentation

## 2017-07-05 HISTORY — DX: Herpesviral infection, unspecified: B00.9

## 2017-07-05 LAB — COMPREHENSIVE METABOLIC PANEL
ALBUMIN: 3 g/dL — AB (ref 3.5–5.0)
ALT: 8 U/L — ABNORMAL LOW (ref 14–54)
ANION GAP: 8 (ref 5–15)
AST: 15 U/L (ref 15–41)
Alkaline Phosphatase: 164 U/L — ABNORMAL HIGH (ref 38–126)
BUN: 5 mg/dL — ABNORMAL LOW (ref 6–20)
CALCIUM: 9 mg/dL (ref 8.9–10.3)
CO2: 19 mmol/L — ABNORMAL LOW (ref 22–32)
Chloride: 106 mmol/L (ref 101–111)
Creatinine, Ser: 0.58 mg/dL (ref 0.44–1.00)
GFR calc non Af Amer: >60 mL/min (ref >60)
GLUCOSE: 72 mg/dL (ref 65–99)
POTASSIUM: 4 mmol/L (ref 3.5–5.1)
SODIUM: 133 mmol/L — AB (ref 135–145)
Total Bilirubin: 0.2 mg/dL — ABNORMAL LOW (ref 0.3–1.2)
Total Protein: 7 g/dL (ref 6.5–8.1)

## 2017-07-05 LAB — URINALYSIS, ROUTINE W REFLEX MICROSCOPIC
BILIRUBIN URINE: NEGATIVE
GLUCOSE, UA: NEGATIVE mg/dL
HGB URINE DIPSTICK: NEGATIVE
KETONES UR: NEGATIVE mg/dL
NITRITE: NEGATIVE
Protein, ur: NEGATIVE mg/dL
Specific Gravity, Urine: 1.017 (ref 1.005–1.030)
pH: 5 (ref 5.0–8.0)

## 2017-07-05 LAB — CBC WITH DIFFERENTIAL/PLATELET
BASOS PCT: 0 %
Basophils Absolute: 0 10*3/uL (ref 0.0–0.1)
EOS ABS: 0 10*3/uL (ref 0.0–0.7)
EOS PCT: 0 %
HCT: 25.6 % — ABNORMAL LOW (ref 36.0–46.0)
Hemoglobin: 8 g/dL — ABNORMAL LOW (ref 12.0–15.0)
LYMPHS ABS: 1.8 10*3/uL (ref 0.7–4.0)
Lymphocytes Relative: 21 %
MCH: 20.6 pg — AB (ref 26.0–34.0)
MCHC: 31.3 g/dL (ref 30.0–36.0)
MCV: 66 fL — ABNORMAL LOW (ref 78.0–100.0)
MONO ABS: 0.3 10*3/uL (ref 0.1–1.0)
MONOS PCT: 3 %
NEUTROS PCT: 76 %
Neutro Abs: 6.2 10*3/uL (ref 1.7–7.7)
Platelets: 238 10*3/uL (ref 150–400)
RBC: 3.88 MIL/uL (ref 3.87–5.11)
RDW: 18 % — AB (ref 11.5–15.5)
WBC: 8.3 10*3/uL (ref 4.0–10.5)

## 2017-07-05 MED ORDER — SODIUM CHLORIDE 0.9 % IV SOLN
25.0000 mg | Freq: Once | INTRAVENOUS | Status: AC
  Administered 2017-07-05: 25 mg via INTRAVENOUS
  Filled 2017-07-05: qty 1

## 2017-07-05 MED ORDER — PROMETHAZINE HCL 25 MG PO TABS: 25.0000 mg | ORAL_TABLET | Freq: Four times a day (QID) | ORAL | 2 refills | Status: DC | PRN

## 2017-07-05 NOTE — MAU Provider Note (Addendum)
Chief Complaint:  Nausea and Emesis   First Provider Initiated Contact with Patient 07/05/17 1458     HPI: Tonya Weeks is a 26 y.o. G3P1011 at 6w3dwho presents to maternity admissions reporting nausea and vomiting since yesterday.  Told RN she had no diarrhea but tells me she has had diarrhea for 2 months.  But when we discuss diarrhea, she describes treatment for hemorrhoids.  I specified liquid stools and she affirms she has had them for 2 months.  No new diarrhea escalation with vomiting onset. .States she also has constipation.  She reports good fetal movement, denies LOF, vaginal bleeding, vaginal itching/burning, urinary symptoms, h/a, dizziness, or fever/chills.  She denies headache, visual changes or RUQ abdominal pain.  Got very upset with nurse when she was reviewing history, especially infectious diseases and prior pregnancies.  I told nurse to defer review.  I marked pink sticky note with pt desire to mask IV infusions and not to disclose any medical history.  Emesis   This is a new problem. The current episode started yesterday. The problem has been unchanged. There has been no fever. Associated symptoms include diarrhea. Pertinent negatives include no chest pain, chills, coughing, dizziness, fever, headaches or myalgias. She has tried nothing for the symptoms.    RN Note: Started yesterday, nausea and vomiting.  Denies fever, diarrhea. No bleeding or leaking.  occ tightening in abd.    Past Medical History: Past Medical History:  Diagnosis Date  . History of syphilis    Treated during last pregnancy   . HIV (human immunodeficiency virus infection) (HCC)   . HIV infection (HCC)   . HSV infection   . Sickle cell trait (HCC)     Past obstetric history: OB History  Gravida Para Term Preterm AB Living  3 1 1   1 1   SAB TAB Ectopic Multiple Live Births    1     1    # Outcome Date GA Lbr Len/2nd Weight Sex Delivery Anes PTL Lv  3 Current           2 Term 03/09/12  [redacted]w[redacted]d 05:40 / 03:29 8 lb 3.6 oz (3.73 kg) M Vag-Spont EPI  LIV     Birth Comments: HIV positive mom Treated for syphilis in pregnancy--RPR NR at birth New born screen--HB FAS  All HIV PCR's were negative --Last one after age 29 months was still negative so he was discharged from follow up care by [facility] on 11/14/2012. Letter from Dr Mervyn Skeeters [phone].  1 TAB 2012            Past Surgical History: Past Surgical History:  Procedure Laterality Date  . DILATION AND CURETTAGE, DIAGNOSTIC / THERAPEUTIC      Family History: Family History  Problem Relation Age of Onset  . Lupus Mother   . Hypertension Mother   . Heart failure Mother   . Heart attack Mother   . Sickle cell trait Mother   . Scleroderma Mother   . Hypertension Father   . Arthritis Maternal Grandmother   . Diabetes Maternal Grandmother   . Hypertension Maternal Grandmother   . Stroke Maternal Grandmother   . Heart failure Maternal Grandmother   . Heart attack Maternal Grandmother   . Asthma Maternal Grandmother   . Arthritis Maternal Grandfather   . Diabetes Maternal Grandfather   . Hypertension Maternal Grandfather   . Stroke Maternal Grandfather   . Heart failure Maternal Grandfather   . Heart attack Maternal Grandfather   .  Kidney disease Maternal Grandfather   . Other Neg Hx     Social History: Social History   Tobacco Use  . Smoking status: Never Smoker  . Smokeless tobacco: Never Used  Substance Use Topics  . Alcohol use: Yes    Comment: Socially   . Drug use: No    Allergies: No Known Allergies  Meds:  Medications Prior to Admission  Medication Sig Dispense Refill Last Dose  . dolutegravir (TIVICAY) 50 MG tablet Take 1 tablet (50 mg total) by mouth daily. 30 tablet 5   . emtricitabine-tenofovir (TRUVADA) 200-300 MG tablet Take 1 tablet by mouth daily. 30 tablet 11   . HYDROcodone-acetaminophen (NORCO/VICODIN) 5-325 MG per tablet Take 1 tablet by mouth every 4 (four) hours as needed.  (Patient not taking: Reported on 04/15/2015) 10 tablet 0 Not Taking  . hydrocortisone cream 1 % Apply 1 application topically as needed for itching. Reported on 04/15/2015   Taking  . ibuprofen (ADVIL,MOTRIN) 600 MG tablet Take 1 tablet (600 mg total) by mouth every 6 (six) hours as needed for pain. (Patient not taking: Reported on 04/15/2015) 30 tablet 5 Not Taking  . methocarbamol (ROBAXIN) 500 MG tablet Take 1 tablet (500 mg total) by mouth 2 (two) times daily. (Patient not taking: Reported on 04/15/2015) 20 tablet 0 Not Taking  . naproxen (NAPROSYN) 500 MG tablet Take 1 tablet (500 mg total) by mouth 2 (two) times daily. (Patient not taking: Reported on 04/15/2015) 30 tablet 0 Not Taking  . sertraline (ZOLOFT) 50 MG tablet Take 100 mg by mouth daily. Reported on 04/15/2015   Not Taking    I have reviewed patient's Past Medical Hx, Surgical Hx, Family Hx, Social Hx, medications and allergies.   ROS:  Review of Systems  Constitutional: Negative for chills and fever.  Respiratory: Negative for cough.   Cardiovascular: Negative for chest pain.  Gastrointestinal: Positive for diarrhea and vomiting.  Musculoskeletal: Negative for myalgias.  Neurological: Negative for dizziness and headaches.   Other systems negative  Physical Exam   Patient Vitals for the past 24 hrs:  BP Temp Temp src Pulse Resp SpO2  07/05/17 1526 - - - - - 99 %  07/05/17 1521 - - - - - 100 %  07/05/17 1516 - - - - - 99 %  07/05/17 1511 - - - - - 100 %  07/05/17 1504 138/76 98.5 F (36.9 C) Oral (!) 117 16 100 %   Constitutional: Well-developed, well-nourished female in no acute distress.  Cardiovascular: normal rate and rhythm Respiratory: normal effort, clear to auscultation bilaterally GI: Abd soft, non-tender, gravid appropriate for gestational age.   No rebound or guarding. MS: Extremities nontender, no edema, normal ROM Neurologic: Alert and oriented x 4.  GU: Neg CVAT.  PELVIC EXAM: Dilation:  Fingertip Effacement (%): 30 Station: BuckhornBallotable, -3 Presentation: Vertex Exam by:: Artelia LarocheM Warren Kugelman CNM  FHT:  Baseline 140 , moderate variability, accelerations present, no decelerations Contractions: Irregular    Labs: Results for orders placed or performed during the hospital encounter of 07/05/17 (from the past 24 hour(s))  Urinalysis, Routine w reflex microscopic     Status: Abnormal   Collection Time: 07/05/17  2:50 PM  Result Value Ref Range   Color, Urine YELLOW YELLOW   APPearance HAZY (A) CLEAR   Specific Gravity, Urine 1.017 1.005 - 1.030   pH 5.0 5.0 - 8.0   Glucose, UA NEGATIVE NEGATIVE mg/dL   Hgb urine dipstick NEGATIVE NEGATIVE   Bilirubin Urine  NEGATIVE NEGATIVE   Ketones, ur NEGATIVE NEGATIVE mg/dL   Protein, ur NEGATIVE NEGATIVE mg/dL   Nitrite NEGATIVE NEGATIVE   Leukocytes, UA MODERATE (A) NEGATIVE   RBC / HPF 0-5 0 - 5 RBC/hpf   WBC, UA 6-30 0 - 5 WBC/hpf   Bacteria, UA RARE (A) NONE SEEN   Squamous Epithelial / LPF 6-30 (A) NONE SEEN   Mucus PRESENT   CBC with Differential/Platelet     Status: Abnormal (Preliminary result)   Collection Time: 07/05/17  5:07 PM  Result Value Ref Range   WBC 8.3 4.0 - 10.5 K/uL   RBC 3.88 3.87 - 5.11 MIL/uL   Hemoglobin 8.0 (L) 12.0 - 15.0 g/dL   HCT 11.9 (L) 14.7 - 82.9 %   MCV 66.0 (L) 78.0 - 100.0 fL   MCH 20.6 (L) 26.0 - 34.0 pg   MCHC 31.3 30.0 - 36.0 g/dL   RDW 56.2 (H) 13.0 - 86.5 %   Platelets 238 150 - 400 K/uL   Other PENDING %   Neutrophils Relative % 76 %   Neutro Abs 6.2 1.7 - 7.7 K/uL   Lymphocytes Relative 21 %   Lymphs Abs 1.8 0.7 - 4.0 K/uL   Monocytes Relative 3 %   Monocytes Absolute 0.3 0.1 - 1.0 K/uL   Eosinophils Relative 0 %   Eosinophils Absolute 0.0 0.0 - 0.7 K/uL   Basophils Relative 0 %   Basophils Absolute 0.0 0.0 - 0.1 K/uL  Comprehensive metabolic panel     Status: Abnormal   Collection Time: 07/05/17  5:07 PM  Result Value Ref Range   Sodium 133 (L) 135 - 145 mmol/L   Potassium 4.0  3.5 - 5.1 mmol/L   Chloride 106 101 - 111 mmol/L   CO2 19 (L) 22 - 32 mmol/L   Glucose, Bld 72 65 - 99 mg/dL   BUN 5 (L) 6 - 20 mg/dL   Creatinine, Ser 7.84 0.44 - 1.00 mg/dL   Calcium 9.0 8.9 - 69.6 mg/dL   Total Protein 7.0 6.5 - 8.1 g/dL   Albumin 3.0 (L) 3.5 - 5.0 g/dL   AST 15 15 - 41 U/L   ALT 8 (L) 14 - 54 U/L   Alkaline Phosphatase 164 (H) 38 - 126 U/L   Total Bilirubin 0.2 (L) 0.3 - 1.2 mg/dL   GFR calc non Af Amer >60 >60 mL/min   GFR calc Af Amer >60 >60 mL/min   Anion gap 8 5 - 15     Imaging:  No results found.  MAU Course/MDM: I have ordered labs and reviewed results. Potassium is normal as is her WBC.  Urine shows very little dehydration.  LFTs are likewise normal  NST reviewed and found to be reactive Consult Dr Tenny Craw with presentation, exam findings and test results.  Treatments in MAU included IV hydration and Promethazine.    Assessment: Single IUP at [redacted]w[redacted]d Nausea and vomiting (none while here) Chronic diarrhea (none while here)  Plan: Discharge home Rx Phenergan for nausea/vomiting Advance diet as tolerated Preterm Labor precautions and fetal kick counts Follow up in Office for prenatal visits and recheck of status  Encouraged to return here or to other Urgent Care/ED if she develops worsening of symptoms, increase in pain, fever, or other concerning symptoms.   Pt stable at time of discharge.  Wynelle Bourgeois CNM, MSN Certified Nurse-Midwife 07/05/2017 3:40 PM

## 2017-07-05 NOTE — MAU Note (Signed)
Pt refuses to discuss medical history, said she doesn't want even medical staff discussing her hx.

## 2017-07-05 NOTE — MAU Note (Signed)
Started yesterday, nausea and vomiting.  Denies fever, diarrhea. No bleeding or leaking.  occ tightening in abd.

## 2017-07-05 NOTE — Discharge Instructions (Signed)
Nausea and Vomiting, Adult Feeling sick to your stomach (nausea) means that your stomach is upset or you feel like you have to throw up (vomit). Feeling more and more sick to your stomach can lead to throwing up. Throwing up happens when food and liquid from your stomach are thrown up and out the mouth. Throwing up can make you feel weak and cause you to get dehydrated. Dehydration can make you tired and thirsty, make you have a dry mouth, and make it so you pee (urinate) less often. Older adults and people with other diseases or a weak defense system (immune system) are at higher risk for dehydration. If you feel sick to your stomach or if you throw up, it is important to follow instructions from your doctor about how to take care of yourself. Follow these instructions at home: Eating and drinking Follow these instructions as told by your doctor:  Take an oral rehydration solution (ORS). This is a drink that is sold at pharmacies and stores.  Drink clear fluids in small amounts as you are able, such as: ? Water. ? Ice chips. ? Diluted fruit juice. ? Low-calorie sports drinks.  Eat bland, easy-to-digest foods in small amounts as you are able, such as: ? Bananas. ? Applesauce. ? Rice. ? Low-fat (lean) meats. ? Toast. ? Crackers.  Avoid fluids that have a lot of sugar or caffeine in them.  Avoid alcohol.  Avoid spicy or fatty foods.  General instructions  Drink enough fluid to keep your pee (urine) clear or pale yellow.  Wash your hands often. If you cannot use soap and water, use hand sanitizer.  Make sure that all people in your home wash their hands well and often.  Take over-the-counter and prescription medicines only as told by your doctor.  Rest at home while you get better.  Watch your condition for any changes.  Breathe slowly and deeply when you feel sick to your stomach.  Keep all follow-up visits as told by your doctor. This is important. Contact a doctor  if:  You have a fever.  You cannot keep fluids down.  Your symptoms get worse.  You have new symptoms.  You feel sick to your stomach for more than two days.  You feel light-headed or dizzy.  You have a headache.  You have muscle cramps. Get help right away if:  You have pain in your chest, neck, arm, or jaw.  You feel very weak or you pass out (faint).  You throw up again and again.  You see blood in your throw-up.  Your throw-up looks like black coffee grounds.  You have bloody or black poop (stools) or poop that look like tar.  You have a very bad headache, a stiff neck, or both.  You have a rash.  You have very bad pain, cramping, or bloating in your belly (abdomen).  You have trouble breathing.  You are breathing very quickly.  Your heart is beating very quickly.  Your skin feels cold and clammy.  You feel confused.  You have pain when you pee.  You have signs of dehydration, such as: ? Dark pee, hardly any pee, or no pee. ? Cracked lips. ? Dry mouth. ? Sunken eyes. ? Sleepiness. ? Weakness. These symptoms may be an emergency. Do not wait to see if the symptoms will go away. Get medical help right away. Call your local emergency services (911 in the [place] Do not drive yourself to the hospital. This information is   not intended to replace advice given to you by your health care provider. Make sure you discuss any questions you have with your health care provider. Document Released: 09/02/2007 Document Revised: 10/04/2015 Document Reviewed: 11/20/2014 Elsevier Interactive Patient Education  2018 Elsevier Inc.  

## 2017-07-06 ENCOUNTER — Encounter (HOSPITAL_COMMUNITY): Payer: Self-pay | Admitting: *Deleted

## 2017-07-06 ENCOUNTER — Telehealth (HOSPITAL_COMMUNITY): Payer: Self-pay | Admitting: *Deleted

## 2017-07-06 NOTE — Telephone Encounter (Signed)
Preadmission screen  

## 2017-07-08 ENCOUNTER — Encounter (HOSPITAL_COMMUNITY): Payer: Self-pay

## 2017-07-08 ENCOUNTER — Inpatient Hospital Stay (HOSPITAL_COMMUNITY)
Admission: RE | Admit: 2017-07-08 | Discharge: 2017-07-11 | DRG: 807 | Disposition: A | Discharge status: Home / Self Care | Payer: Medicaid Other | Source: Ambulatory Visit | Attending: Obstetrics and Gynecology | Admitting: Obstetrics and Gynecology

## 2017-07-08 DIAGNOSIS — Z21 Asymptomatic human immunodeficiency virus [HIV] infection status: Secondary | ICD-10-CM | POA: Diagnosis present

## 2017-07-08 DIAGNOSIS — Z3A38 38 weeks gestation of pregnancy: Secondary | ICD-10-CM | POA: Diagnosis not present

## 2017-07-08 DIAGNOSIS — D573 Sickle-cell trait: Secondary | ICD-10-CM | POA: Diagnosis present

## 2017-07-08 DIAGNOSIS — O9902 Anemia complicating childbirth: Secondary | ICD-10-CM | POA: Diagnosis present

## 2017-07-08 DIAGNOSIS — Z3490 Encounter for supervision of normal pregnancy, unspecified, unspecified trimester: Secondary | ICD-10-CM | POA: Diagnosis present

## 2017-07-08 DIAGNOSIS — O9872 Human immunodeficiency virus [HIV] disease complicating childbirth: Secondary | ICD-10-CM | POA: Diagnosis present

## 2017-07-08 LAB — ABO/RH: ABO/RH(D): O POS

## 2017-07-08 LAB — CBC
HEMATOCRIT: 29.5 % — AB (ref 36.0–46.0)
HEMOGLOBIN: 9.2 g/dL — AB (ref 12.0–15.0)
MCH: 20.5 pg — AB (ref 26.0–34.0)
MCHC: 31.2 g/dL (ref 30.0–36.0)
MCV: 65.8 fL — AB (ref 78.0–100.0)
PLATELETS: 324 10*3/uL (ref 150–400)
RBC: 4.48 MIL/uL (ref 3.87–5.11)
RDW: 18.1 % — ABNORMAL HIGH (ref 11.5–15.5)
WBC: 6.8 10*3/uL (ref 4.0–10.5)

## 2017-07-08 LAB — TYPE AND SCREEN
ABO/RH(D): O POS
Antibody Screen: NEGATIVE

## 2017-07-08 MED ORDER — OXYCODONE-ACETAMINOPHEN 5-325 MG PO TABS: 1.0000 | ORAL_TABLET | ORAL | Status: DC | PRN

## 2017-07-08 MED ORDER — OXYTOCIN BOLUS FROM INFUSION
500.0000 mL | Freq: Once | INTRAVENOUS | Status: AC
  Administered 2017-07-09: 500 mL via INTRAVENOUS

## 2017-07-08 MED ORDER — ONDANSETRON HCL 4 MG/2ML IJ SOLN
4.0000 mg | Freq: Four times a day (QID) | INTRAMUSCULAR | Status: DC | PRN
  Administered 2017-07-09: 4 mg via INTRAVENOUS
  Filled 2017-07-08: qty 2

## 2017-07-08 MED ORDER — PENICILLIN G POTASSIUM 5000000 UNITS IJ SOLR
5.0000 10*6.[IU] | Freq: Once | INTRAMUSCULAR | Status: AC
  Administered 2017-07-08: 5 10*6.[IU] via INTRAVENOUS
  Filled 2017-07-08: qty 5

## 2017-07-08 MED ORDER — FENTANYL 2.5 MCG/ML BUPIVACAINE 1/10 % EPIDURAL INFUSION (WH - ANES)
14.0000 mL/h | INTRAMUSCULAR | Status: DC | PRN
  Administered 2017-07-09: 14 mL/h via EPIDURAL
  Filled 2017-07-08: qty 100

## 2017-07-08 MED ORDER — LACTATED RINGERS IV SOLN
500.0000 mL | Freq: Once | INTRAVENOUS | Status: AC
  Administered 2017-07-09: 500 mL via INTRAVENOUS

## 2017-07-08 MED ORDER — LIDOCAINE HCL (PF) 1 % IJ SOLN
30.0000 mL | INTRAMUSCULAR | Status: DC | PRN
  Filled 2017-07-08: qty 30

## 2017-07-08 MED ORDER — BUTORPHANOL TARTRATE 1 MG/ML IJ SOLN
1.0000 mg | INTRAMUSCULAR | Status: DC | PRN
  Administered 2017-07-09: 1 mg via INTRAVENOUS
  Filled 2017-07-08 (×2): qty 1

## 2017-07-08 MED ORDER — TERBUTALINE SULFATE 1 MG/ML IJ SOLN
0.2500 mg | Freq: Once | INTRAMUSCULAR | Status: DC | PRN
  Filled 2017-07-08: qty 1

## 2017-07-08 MED ORDER — EPHEDRINE 5 MG/ML INJ
10.0000 mg | INTRAVENOUS | Status: DC | PRN
  Filled 2017-07-08: qty 2

## 2017-07-08 MED ORDER — LACTATED RINGERS IV SOLN
500.0000 mL | INTRAVENOUS | Status: DC | PRN
  Administered 2017-07-09: 500 mL via INTRAVENOUS

## 2017-07-08 MED ORDER — OXYCODONE-ACETAMINOPHEN 5-325 MG PO TABS: 2.0000 | ORAL_TABLET | ORAL | Status: DC | PRN

## 2017-07-08 MED ORDER — DIPHENHYDRAMINE HCL 50 MG/ML IJ SOLN: 12.5000 mg | INTRAMUSCULAR | Status: DC | PRN

## 2017-07-08 MED ORDER — PHENYLEPHRINE 40 MCG/ML (10ML) SYRINGE FOR IV PUSH (FOR BLOOD PRESSURE SUPPORT)
80.0000 ug | PREFILLED_SYRINGE | INTRAVENOUS | Status: DC | PRN
  Administered 2017-07-09: 80 ug via INTRAVENOUS
  Filled 2017-07-08: qty 5
  Filled 2017-07-08: qty 10

## 2017-07-08 MED ORDER — LACTATED RINGERS IV SOLN
INTRAVENOUS | Status: DC
  Administered 2017-07-08: 21:00:00 via INTRAVENOUS

## 2017-07-08 MED ORDER — BUTORPHANOL TARTRATE 1 MG/ML IJ SOLN
1.0000 mg | INTRAMUSCULAR | Status: DC | PRN
  Administered 2017-07-09: 1 mg via INTRAVENOUS
  Filled 2017-07-08: qty 1

## 2017-07-08 MED ORDER — ACETAMINOPHEN 325 MG PO TABS: 650.0000 mg | ORAL_TABLET | ORAL | Status: DC | PRN

## 2017-07-08 MED ORDER — SOD CITRATE-CITRIC ACID 500-334 MG/5ML PO SOLN: 30.0000 mL | ORAL | Status: DC | PRN

## 2017-07-08 MED ORDER — ZIDOVUDINE 10 MG/ML IV SOLN
1.0000 mg/kg/h | INTRAVENOUS | Status: DC
  Administered 2017-07-08 – 2017-07-09 (×7): 1 mg/kg/h via INTRAVENOUS
  Filled 2017-07-08 (×8): qty 40

## 2017-07-08 MED ORDER — TERBUTALINE SULFATE 1 MG/ML IJ SOLN
0.2500 mg | Freq: Once | INTRAMUSCULAR | Status: DC | PRN
Start: 2017-07-08 — End: 2017-07-09
  Filled 2017-07-08: qty 1

## 2017-07-08 MED ORDER — PHENYLEPHRINE 40 MCG/ML (10ML) SYRINGE FOR IV PUSH (FOR BLOOD PRESSURE SUPPORT)
80.0000 ug | PREFILLED_SYRINGE | INTRAVENOUS | Status: DC | PRN
  Filled 2017-07-08: qty 5

## 2017-07-08 MED ORDER — PENICILLIN G POT IN DEXTROSE 60000 UNIT/ML IV SOLN
3.0000 10*6.[IU] | INTRAVENOUS | Status: DC
  Administered 2017-07-08 – 2017-07-09 (×6): 3 10*6.[IU] via INTRAVENOUS
  Filled 2017-07-08 (×7): qty 50

## 2017-07-08 MED ORDER — ZIDOVUDINE 10 MG/ML IV SOLN
2.0000 mg/kg | Freq: Once | INTRAVENOUS | Status: AC
  Administered 2017-07-08: 184 mg via INTRAVENOUS
  Filled 2017-07-08: qty 18.4

## 2017-07-08 MED ORDER — FLEET ENEMA 7-19 GM/118ML RE ENEM: 1.0000 | ENEMA | RECTAL | Status: DC | PRN

## 2017-07-08 MED ORDER — OXYTOCIN 40 UNITS IN LACTATED RINGERS INFUSION - SIMPLE MED: 2.5000 [IU]/h | INTRAVENOUS | Status: DC

## 2017-07-08 MED ORDER — MISOPROSTOL 25 MCG QUARTER TABLET
25.0000 ug | ORAL_TABLET | ORAL | Status: DC | PRN
  Administered 2017-07-08 (×3): 25 ug via VAGINAL
  Filled 2017-07-08 (×4): qty 1

## 2017-07-08 MED ORDER — OXYTOCIN 40 UNITS IN LACTATED RINGERS INFUSION - SIMPLE MED
1.0000 m[IU]/min | INTRAVENOUS | Status: DC
  Administered 2017-07-08: 2 m[IU]/min via INTRAVENOUS
  Filled 2017-07-08: qty 1000

## 2017-07-08 NOTE — Anesthesia Pain Management Evaluation Note (Signed)
  CRNA Pain Management Visit Note  Patient: Tonya DawesBrittani Hu, 27 y.o., female  "Hello I am a member of the anesthesia team at [facility]. We have an anesthesia team available at all times to provide care throughout the hospital, including epidural management and anesthesia for C-section. I don't know your plan for the delivery whether it a natural birth, water birth, IV sedation, nitrous supplementation, doula or epidural, but we want to meet your pain goals."   1.Was your pain managed to your expectations on prior hospitalizations?   Yes   2.What is your expectation for pain management during this hospitalization?     Epidural  3.How can we help you reach that goal? Epidural @ pain goal.  Record the patient's initial score and the patient's pain goal.   Pain: 0  Pain Goal: 5 [facility] wants you to be able to say your pain was always managed very well.  Jana Swartzlander 07/08/2017

## 2017-07-08 NOTE — H&P (Signed)
27 y.o. 3267w6d  G3P1011 comes in for induction at term.  Pt is HIV + and will get AZT protocol during labor.  Pt had syphillis in last pregnancy and was retreated in this pregnancy- she was seen by ID for both .  Otherwise has good fetal movement and no bleeding.  Past Medical History:  Diagnosis Date  . Anemia   . History of syphilis    Treated during last pregnancy   . HIV (human immunodeficiency virus infection) (HCC)   . HIV infection (HCC)   . HSV infection   . Sickle cell trait [facility](HCC)     Past Surgical History:  Procedure Laterality Date  . DILATION AND CURETTAGE, DIAGNOSTIC / THERAPEUTIC      OB History  Gravida Para Term Preterm AB Living  3 1 1   1 1   SAB TAB Ectopic Multiple Live Births    1     1    # Outcome Date GA Lbr Len/2nd Weight Sex Delivery Anes PTL Lv  3 Current           2 Term 03/09/12 6129w0d 05:40 / 03:29 8 lb 3.6 oz (3.73 kg) M Vag-Spont EPI  LIV     Birth Comments: HIV positive mom Treated for syphilis in pregnancy--RPR NR at birth New born screen--HB FAS  All HIV PCR's were negative --Last one after age 676 months was still negative so [facility]. Letter from Dr Mervyn SkeetersA [phone]--[phone].  1 TAB 2012            Social History   Socioeconomic History  . Marital status: Single    Spouse name: Not on file  . Number of children: Not on file  . Years of education: Not on file  . Highest education level: Not on file  Occupational History  . Not on file  Social Needs  . Financial resource strain: Not on file  . Food insecurity:    Worry: Not on file    Inability: Not on file  . Transportation needs:    Medical: Not on file    Non-medical: Not on file  Tobacco Use  . Smoking status: Never Smoker  . Smokeless tobacco: Never Used  Substance and Sexual Activity  . Alcohol use: Yes    Comment: Socially   . Drug use: No  . Sexual activity: Never    Partners: Male  Lifestyle  . Physical activity:    Days per week: Not on file    Minutes per session: Not on file  . Stress: Not on file  Relationships  . Social connections:    Talks on phone: Not on file    Gets together: Not on file    Attends religious service: Not on file    Active member of club or organization: Not on file    Attends meetings of clubs or organizations: Not on file    Relationship status: Not on file  . Intimate partner violence:    Fear of current or ex partner: Not on file    Emotionally abused: Not on file    Physically abused: Not on file    Forced sexual activity: Not on file  Other Topics Concern  . Not on file  Social History Narrative  . Not on file   Patient has no known allergies.    Prenatal Transfer Tool  Maternal Diabetes: No Genetic Screening: Normal Maternal Ultrasounds/Referrals: Normal Fetal Ultrasounds or other  Referrals:  Other: Infectious disease Maternal Substance Abuse:  No Significant Maternal Medications:  Meds include: Other:  Significant Maternal Lab Results: Lab values include: HIV positive, Other: RPR pos:  Last HIV titer in office was <100;  RPR titer was 1:2 Pt does NOT have HSV.  ++++Please do not discuss infections or meds in front of any family or visitors++++  Other PNC: Sickle trait- FOB is     Vitals:   07/08/17 0728  BP: 127/82  Pulse: (!) 107  Resp: 16  Weight: 203 lb 3.2 oz (92.2 kg)  Height: 5\' 4"  (1.626 m)    Lungs/Cor:  NAD Abdomen:  soft, gravid Ex:  no cords, erythema SVE:  1/30/-3, VTX, cytotec placed FHTs:  130s, good STV, NST R Toco:  q occ   A/P   Term induction- AZT protocol, Viral load checked, RPR pending as well.  Pt does not want infections discussed in front of any visitors.  GBS POS- PCN.  Poppi Scantling A

## 2017-07-09 ENCOUNTER — Encounter (HOSPITAL_COMMUNITY): Payer: Self-pay

## 2017-07-09 ENCOUNTER — Inpatient Hospital Stay (HOSPITAL_COMMUNITY): Payer: Medicaid Other | Admitting: Anesthesiology

## 2017-07-09 DIAGNOSIS — Z3490 Encounter for supervision of normal pregnancy, unspecified, unspecified trimester: Secondary | ICD-10-CM

## 2017-07-09 LAB — RPR: RPR Ser Ql: REACTIVE — AB

## 2017-07-09 LAB — RPR, QUANT+TP ABS (REFLEX)
Rapid Plasma Reagin, Quant: 1:2 {titer} — ABNORMAL HIGH
T Pallidum Abs: POSITIVE — AB

## 2017-07-09 MED ORDER — ZOLPIDEM TARTRATE 5 MG PO TABS: 5.0000 mg | ORAL_TABLET | Freq: Every evening | ORAL | Status: DC | PRN

## 2017-07-09 MED ORDER — ACETAMINOPHEN 325 MG PO TABS
650.0000 mg | ORAL_TABLET | ORAL | Status: DC | PRN
  Administered 2017-07-09 – 2017-07-11 (×3): 650 mg via ORAL
  Filled 2017-07-09 (×3): qty 2

## 2017-07-09 MED ORDER — DOLUTEGRAVIR SODIUM 50 MG PO TABS
50.0000 mg | ORAL_TABLET | Freq: Every day | ORAL | Status: DC
  Administered 2017-07-09 – 2017-07-10 (×2): 50 mg via ORAL
  Filled 2017-07-09 (×4): qty 1

## 2017-07-09 MED ORDER — SENNOSIDES-DOCUSATE SODIUM 8.6-50 MG PO TABS
2.0000 | ORAL_TABLET | ORAL | Status: DC
  Administered 2017-07-09 – 2017-07-10 (×2): 2 via ORAL
  Filled 2017-07-09 (×3): qty 2

## 2017-07-09 MED ORDER — LIDOCAINE HCL (PF) 1 % IJ SOLN
INTRAMUSCULAR | Status: DC | PRN
  Administered 2017-07-09: 5 mL via EPIDURAL
  Administered 2017-07-09: 7 mL via EPIDURAL

## 2017-07-09 MED ORDER — ONDANSETRON HCL 4 MG PO TABS
4.0000 mg | ORAL_TABLET | ORAL | Status: DC | PRN
Start: 2017-07-09 — End: 2017-07-11

## 2017-07-09 MED ORDER — BENZOCAINE-MENTHOL 20-0.5 % EX AERO
1.0000 "application " | INHALATION_SPRAY | CUTANEOUS | Status: DC | PRN
  Administered 2017-07-09: 1 via TOPICAL
  Filled 2017-07-09: qty 56

## 2017-07-09 MED ORDER — SIMETHICONE 80 MG PO CHEW: 80.0000 mg | CHEWABLE_TABLET | ORAL | Status: DC | PRN

## 2017-07-09 MED ORDER — MEASLES, MUMPS & RUBELLA VAC ~~LOC~~ INJ
0.5000 mL | INJECTION | Freq: Once | SUBCUTANEOUS | Status: DC
  Filled 2017-07-09: qty 0.5

## 2017-07-09 MED ORDER — ONDANSETRON HCL 4 MG/2ML IJ SOLN: 4.0000 mg | INTRAMUSCULAR | Status: DC | PRN

## 2017-07-09 MED ORDER — DIBUCAINE 1 % RE OINT: 1.0000 "application " | TOPICAL_OINTMENT | RECTAL | Status: DC | PRN

## 2017-07-09 MED ORDER — OXYCODONE-ACETAMINOPHEN 5-325 MG PO TABS: 2.0000 | ORAL_TABLET | ORAL | Status: DC | PRN

## 2017-07-09 MED ORDER — COCONUT OIL OIL: 1.0000 "application " | TOPICAL_OIL | Status: DC | PRN

## 2017-07-09 MED ORDER — OXYCODONE-ACETAMINOPHEN 5-325 MG PO TABS: 1.0000 | ORAL_TABLET | ORAL | Status: DC | PRN

## 2017-07-09 MED ORDER — EMTRICITABINE-TENOFOVIR AF 200-25 MG PO TABS: 1.0000 | ORAL_TABLET | Freq: Every day | ORAL | Status: DC

## 2017-07-09 MED ORDER — TETANUS-DIPHTH-ACELL PERTUSSIS 5-2.5-18.5 LF-MCG/0.5 IM SUSP: 0.5000 mL | Freq: Once | INTRAMUSCULAR | Status: DC

## 2017-07-09 MED ORDER — EMTRICITABINE-TENOFOVIR AF 200-25 MG PO TABS
1.0000 | ORAL_TABLET | Freq: Every day | ORAL | Status: DC
  Administered 2017-07-09 – 2017-07-10 (×2): 1 via ORAL
  Filled 2017-07-09 (×4): qty 1

## 2017-07-09 MED ORDER — WITCH HAZEL-GLYCERIN EX PADS: 1.0000 "application " | MEDICATED_PAD | CUTANEOUS | Status: DC | PRN

## 2017-07-09 MED ORDER — IBUPROFEN 600 MG PO TABS
600.0000 mg | ORAL_TABLET | Freq: Four times a day (QID) | ORAL | Status: DC
  Administered 2017-07-09 – 2017-07-11 (×8): 600 mg via ORAL
  Filled 2017-07-09 (×8): qty 1

## 2017-07-09 NOTE — Anesthesia Procedure Notes (Signed)
Epidural Patient location during procedure: OB Start time: 07/09/2017 4:48 AM End time: 07/09/2017 4:51 AM  Staffing Anesthesiologist: Leilani AbleHatchett, Haskel Dewalt, MD Performed: anesthesiologist   Preanesthetic Checklist Completed: patient identified, site marked, surgical consent, pre-op evaluation, timeout performed, IV checked, risks and benefits discussed and monitors and equipment checked  Epidural Patient position: sitting Prep: site prepped and draped and DuraPrep Patient monitoring: continuous pulse ox and blood pressure Approach: midline Location: L3-L4 Injection technique: LOR air  Needle:  Needle type: Tuohy  Needle gauge: 17 G Needle length: 9 cm and 9 Needle insertion depth: 5 cm cm Catheter type: closed end flexible Catheter size: 19 Gauge Catheter at skin depth: 10 cm Test dose: negative and Other  Assessment Sensory level: T9 Events: blood not aspirated, injection not painful, no injection resistance, negative IV test and no paresthesia  Additional Notes Reason for block:procedure for pain

## 2017-07-09 NOTE — Anesthesia Preprocedure Evaluation (Signed)
Anesthesia Evaluation  Patient identified by MRN, date of birth, ID band Patient awake    Reviewed: Allergy & Precautions, H&P , NPO status , Patient's Chart, lab work & pertinent test results  Airway Mallampati: II  TM Distance: >3 FB Neck ROM: full    Dental no notable dental hx. (+) Teeth Intact   Pulmonary neg pulmonary ROS,    Pulmonary exam normal breath sounds clear to auscultation       Cardiovascular negative cardio ROS Normal cardiovascular exam Rhythm:regular Rate:Normal     Neuro/Psych negative neurological ROS     GI/Hepatic negative GI ROS, Neg liver ROS,   Endo/Other  negative endocrine ROS  Renal/GU negative Renal ROS  negative genitourinary   Musculoskeletal   Abdominal (+) + obese,   Peds  Hematology negative hematology ROS (+) Blood dyscrasia, anemia , HIV,   Anesthesia Other Findings   Reproductive/Obstetrics (+) Pregnancy                             Anesthesia Physical Anesthesia Plan  ASA: III  Anesthesia Plan: Epidural   Post-op Pain Management:    Induction:   PONV Risk Score and Plan:   Airway Management Planned:   Additional Equipment:   Intra-op Plan:   Post-operative Plan:   Informed Consent: I have reviewed the patients History and Physical, chart, labs and discussed the procedure including the risks, benefits and alternatives for the proposed anesthesia with the patient or authorized representative who has indicated his/her understanding and acceptance.     Plan Discussed with:   Anesthesia Plan Comments:         Anesthesia Quick Evaluation

## 2017-07-09 NOTE — Progress Notes (Signed)
Patient told the plan  Of care with her and infant. Mom only wants to bottle feed only formula sheet given and educated. Patient states she will let me know when she her Motrin and other meds. Crib and bulb suction explained . Patient told to call out when she need to void again. She is paranoid about family members and FOB finding out about her history. She even wants me to talk softly about her history even with no one in the room. She is very paranoid about that . Social consult in.

## 2017-07-09 NOTE — Progress Notes (Signed)
Pt RPR result came back reactive. Called results in to Dr. Dareen PianoAnderson. No new orders at this time.

## 2017-07-10 MED ORDER — IBUPROFEN 600 MG PO TABS: 600.0000 mg | ORAL_TABLET | Freq: Four times a day (QID) | ORAL | 0 refills | Status: DC

## 2017-07-10 MED ORDER — DOLUTEGRAVIR SODIUM 50 MG PO TABS: 50.0000 mg | ORAL_TABLET | Freq: Every day | ORAL | 0 refills | Status: DC

## 2017-07-10 NOTE — Discharge Summary (Signed)
Obstetric Discharge Summary Reason for Admission: onset of labor Prenatal Procedures: NST and ultrasound Intrapartum Procedures: spontaneous vaginal delivery Postpartum Procedures: none Complications-Operative and Postpartum: none Hemoglobin  Date Value Ref Range Status  07/08/2017 9.2 (L) 12.0 - 15.0 g/dL Final   HCT  Date Value Ref Range Status  07/08/2017 29.5 (L) 36.0 - 46.0 % Final    Physical Exam:  General: alert Lochia: appropriate Uterine Fundus: firm   Discharge Diagnoses: Term Pregnancy-delivered  Discharge Information: Date: 07/10/2017 Activity: pelvic rest Diet: routine Medications: PNV and Ibuprofen Condition: stable Instructions: refer to practice specific booklet Discharge to: home Follow-up Information    Tonya Weeks, Mark E, MD. Schedule an appointment as soon as possible for a visit in 1 month(s).   Specialty:  Obstetrics and Gynecology Contact information: [address] [place] [phone]           Newborn Data: Live born female  Birth Weight: 6 lb 5.1 oz (2865 g) APGAR: 4, 8  Newborn Delivery   Birth date/time:  07/09/2017 09:03:00 Delivery type:  Vaginal, Spontaneous     Home with mother.  ANDERSON,MARK E 07/10/2017, 11:39 AM

## 2017-07-10 NOTE — Progress Notes (Signed)
PPD#1 Pt without complaints. Would like to go home. Lochia wnl VSSAF IMP/ Doing well PP Plan/ Will discharge

## 2017-07-11 LAB — HIV-1 RNA, QUALITATIVE, TMA: HIV-1 RNA, QUAL: NEGATIVE

## 2017-07-12 NOTE — Anesthesia Postprocedure Evaluation (Signed)
Anesthesia Post Note  Patient: Tonya Weeks  Procedure(s) Performed: AN AD HOC LABOR EPIDURAL     Patient location during evaluation: Mother Baby Anesthesia Type: Epidural Level of consciousness: awake and alert Pain management: pain level controlled Vital Signs Assessment: post-procedure vital signs reviewed and stable Respiratory status: spontaneous breathing, nonlabored ventilation and respiratory function stable Cardiovascular status: stable Postop Assessment: no headache, no backache and epidural receding Anesthetic complications: no    Last Vitals: There were no vitals filed for this visit.  Last Pain: There were no vitals filed for this visit.               Lowella CurbWarren Ray Miller

## 2017-07-26 MED FILL — DESCOVY 200-25 MG TABS: 200-25 | 30 days supply | Qty: 30 | Fill #3

## 2017-07-26 MED FILL — TIVICAY 50 MG TABLET: 50 | 30 days supply | Qty: 30 | Fill #3

## 2017-10-05 MED FILL — DESCOVY 200-25 MG TABS: 200-25 | 30 days supply | Qty: 30 | Fill #4

## 2017-10-05 MED FILL — TIVICAY 50 MG TABLET: 50 | 30 days supply | Qty: 30 | Fill #4

## 2017-11-02 MED FILL — DESCOVY 200-25 MG TABS: 200-25 | 30 days supply | Qty: 30 | Fill #5

## 2017-11-02 MED FILL — TIVICAY 50 MG TABLET: 50 | 30 days supply | Qty: 30 | Fill #5

## 2017-12-07 ENCOUNTER — Telehealth: Payer: Self-pay | Admitting: *Deleted

## 2017-12-07 NOTE — Telephone Encounter (Signed)
Patient called to see if we do TB testing. She is confused about the difference between TB and TDaP.   If patient needs TB testing, she will go to [facility]. Patient asked about the cost, said she had [facility] program.   RN asked patient to follow up with [facility]. Patient reluctantly asked for an appointment at the slowest time, when she would see the fewest people.  She will need blood work and to see Olegario Messier to make sure she has coverage as well (now with active medicaid per Olegario Messier). Patient hesitantly agreed to appointment. Andree Coss, RN

## 2017-12-07 NOTE — Telephone Encounter (Signed)
She may be better suited to go to care at [facility] since she is so very concerned about being "outed" here locally. I have spoken to her about this in the past and will discuss again. If she needs a PPD testing (sometimes employers are specific that they want this versus the blood test) then she will need to go to [facility] (I think it is ~$20 when I had to get it 4 years ago).   She should check as to which is acceptable for whomever is requesting this for her - the quantiferon blood work (which is what we offer) or the PPD skin test.   Hopeful she makes this appointment.Marland KitchenMarland Kitchen

## 2017-12-17 ENCOUNTER — Ambulatory Visit: Payer: Self-pay | Admitting: Infectious Diseases

## 2017-12-22 ENCOUNTER — Telehealth: Payer: Self-pay | Admitting: Pharmacist

## 2017-12-22 NOTE — Telephone Encounter (Signed)
[facility] is unable to get in touch with patient for refills of her Tivicay and Descovy.  I have also been trying to get in touch with her for months.

## 2018-04-07 ENCOUNTER — Encounter (HOSPITAL_BASED_OUTPATIENT_CLINIC_OR_DEPARTMENT_OTHER): Payer: Self-pay | Admitting: *Deleted

## 2018-04-07 ENCOUNTER — Emergency Department (HOSPITAL_BASED_OUTPATIENT_CLINIC_OR_DEPARTMENT_OTHER)
Admission: EM | Admit: 2018-04-07 | Discharge: 2018-04-08 | Disposition: A | Discharge status: Home / Self Care | Payer: Medicaid Other | Attending: Emergency Medicine | Admitting: Emergency Medicine

## 2018-04-07 ENCOUNTER — Other Ambulatory Visit: Payer: Self-pay

## 2018-04-07 DIAGNOSIS — R197 Diarrhea, unspecified: Secondary | ICD-10-CM | POA: Insufficient documentation

## 2018-04-07 DIAGNOSIS — R109 Unspecified abdominal pain: Secondary | ICD-10-CM | POA: Insufficient documentation

## 2018-04-07 DIAGNOSIS — R112 Nausea with vomiting, unspecified: Secondary | ICD-10-CM | POA: Diagnosis not present

## 2018-04-07 DIAGNOSIS — Z21 Asymptomatic human immunodeficiency virus [HIV] infection status: Secondary | ICD-10-CM | POA: Diagnosis not present

## 2018-04-07 DIAGNOSIS — Z79899 Other long term (current) drug therapy: Secondary | ICD-10-CM | POA: Insufficient documentation

## 2018-04-07 LAB — COMPREHENSIVE METABOLIC PANEL
ALT: 20 U/L (ref 0–44)
AST: 31 U/L (ref 15–41)
Albumin: 4.4 g/dL (ref 3.5–5.0)
Alkaline Phosphatase: 70 U/L (ref 38–126)
Anion gap: 8 (ref 5–15)
BUN: 10 mg/dL (ref 6–20)
CO2: 21 mmol/L — ABNORMAL LOW (ref 22–32)
Calcium: 9.3 mg/dL (ref 8.9–10.3)
Chloride: 105 mmol/L (ref 98–111)
Creatinine, Ser: 0.51 mg/dL (ref 0.44–1.00)
GFR calc Af Amer: >60 mL/min (ref >60)
GFR calc non Af Amer: >60 mL/min (ref >60)
Glucose, Bld: 101 mg/dL — ABNORMAL HIGH (ref 70–99)
Potassium: 4 mmol/L (ref 3.5–5.1)
Sodium: 134 mmol/L — ABNORMAL LOW (ref 135–145)
Total Bilirubin: 0.4 mg/dL (ref 0.3–1.2)
Total Protein: 8.9 g/dL — ABNORMAL HIGH (ref 6.5–8.1)

## 2018-04-07 LAB — CBC
HCT: 32.4 % — ABNORMAL LOW (ref 36.0–46.0)
Hemoglobin: 9.2 g/dL — ABNORMAL LOW (ref 12.0–15.0)
MCH: 18 pg — ABNORMAL LOW (ref 26.0–34.0)
MCHC: 28.4 g/dL — ABNORMAL LOW (ref 30.0–36.0)
MCV: 63.3 fL — ABNORMAL LOW (ref 80.0–100.0)
NRBC: 0 % (ref 0.0–0.2)
Platelets: 246 10*3/uL (ref 150–400)
RBC: 5.12 MIL/uL — ABNORMAL HIGH (ref 3.87–5.11)
RDW: 19.5 % — ABNORMAL HIGH (ref 11.5–15.5)
WBC: 8.2 10*3/uL (ref 4.0–10.5)

## 2018-04-07 LAB — LIPASE, BLOOD: LIPASE: 27 U/L (ref 11–51)

## 2018-04-07 MED ORDER — SODIUM CHLORIDE 0.9 % IV BOLUS
1000.0000 mL | Freq: Once | INTRAVENOUS | Status: AC
  Administered 2018-04-07: 1000 mL via INTRAVENOUS

## 2018-04-07 MED ORDER — ONDANSETRON HCL 4 MG/2ML IJ SOLN
4.0000 mg | Freq: Once | INTRAMUSCULAR | Status: AC | PRN
  Administered 2018-04-07: 4 mg via INTRAVENOUS
  Filled 2018-04-07: qty 2

## 2018-04-07 NOTE — ED Triage Notes (Addendum)
Abdominal pain. Vomiting and diarrhea today. *Pt is HIV positive.

## 2018-04-07 NOTE — ED Notes (Signed)
Pt given water per her choice for PO Challenge.

## 2018-04-07 NOTE — ED Notes (Signed)
Pt began to vomit with this RN present in room. Standing order for zofran placed.

## 2018-04-07 NOTE — ED Provider Notes (Signed)
[facility] EMERGENCY DEPARTMENT Provider Note   CSN: [id number] Arrival date & time: 04/07/18  2026     History   Chief Complaint Chief Complaint  Patient presents with  . Abdominal Pain    HPI Tonya Weeks is a 28 y.o. female.  HPI Patient presents with nausea vomiting diarrhea.  Began this morning.  Some dull abdominal pain.  No fevers but is had some chills.  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  Somewhat decreased appetite also.  Denies possibility of pregnancy. Past Medical History:  Diagnosis Date  . Anemia   . History of syphilis    Treated during last pregnancy   . HIV (human immunodeficiency virus infection) (HCC)   . HIV infection (HCC)   . HSV infection   . Sickle cell trait [facility](HCC)     Patient Active Problem List   Diagnosis Date Noted  . Pregnancy 07/09/2017  . Encounter for elective induction of labor 07/08/2017  . Nausea & vomiting 07/05/2017  . Pregnancy and infectious disease in third trimester 02/11/2017  . Hypertension in pregnancy 04/15/2015  . Encounter for long-term (current) use of other medications 10/12/2013  . Health care maintenance 10/12/2013  . 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 Depression 09/05/2012  . Syphilis contact, treated 01/07/2012  . HIV disease (HCC) 09/01/2011    Past Surgical History:  Procedure Laterality Date  . DILATION AND CURETTAGE, DIAGNOSTIC / THERAPEUTIC       OB History    Gravida  3   Para  2   Term  2   Preterm      AB  1   Living  2     SAB      TAB  1   Ectopic      Multiple  0   Live Births  2            Home Medications    Prior to Admission medications   Medication Sig Start Date End Date Taking? Authorizing Provider  DESCOVY 200-25 MG tablet Take 1 tablet by mouth daily. 06/14/17  Yes [provider]  dolutegravir (TIVICAY) 50 MG tablet Take 1 tablet (50 mg total) by mouth  daily. 04/19/17  Yes Blanchard Kelchixon, Stephanie N, NP  ibuprofen (ADVIL,MOTRIN) 600 MG tablet Take 1 tablet (600 mg total) by mouth every 6 (six) hours. 07/10/17   Levi AlandAnderson, Mark E, MD  Prenatal Vit-Fe Fumarate-FA (PRENATAL MULTIVITAMIN) TABS tablet Take 1 tablet by mouth daily at 12 noon.    [provider]    Family History Family History  Problem Relation Age of Onset  . Lupus Mother   . Hypertension Mother   . Heart failure Mother   . Heart attack Mother   . Sickle cell trait Mother   . Scleroderma Mother   . Hypertension Father   . Arthritis Maternal Grandmother   . Diabetes Maternal Grandmother   . Hypertension Maternal Grandmother   . Stroke Maternal Grandmother   . Heart failure Maternal Grandmother   . Heart attack Maternal Grandmother   . Asthma Maternal Grandmother   . Arthritis Maternal Grandfather   . Diabetes Maternal Grandfather   . Hypertension Maternal Grandfather   . Stroke Maternal Grandfather   . Heart failure Maternal Grandfather   . Heart attack Maternal Grandfather   . Kidney disease Maternal Grandfather   . Other Neg Hx     Social History Social History  Tobacco Use  . Smoking status: Never Smoker  . Smokeless tobacco: Never Used  Substance Use Topics  . Alcohol use: Yes    Comment: Socially   . Drug use: No     Allergies   Patient has no known allergies.   Review of Systems Review of Systems  Constitutional: Positive for chills. Negative for appetite change and fever.  Respiratory: Negative for shortness of breath.   Gastrointestinal: Positive for abdominal pain, diarrhea, nausea and vomiting.  Genitourinary: Negative for dysuria, vaginal bleeding and vaginal discharge.  Musculoskeletal: Positive for myalgias.  Skin: Negative for pallor.  Neurological: Negative for weakness.  Hematological: Negative for adenopathy.  Psychiatric/Behavioral: Negative for confusion.     Physical Exam Updated Vital Signs BP (!) 127/92   Pulse 100    Temp 98.4 F (36.9 C) (Oral)   Resp 20   Ht 5\' 3"  (1.6 m)   Wt 77.1 kg   LMP 03/29/2018   SpO2 100%   BMI 30.11 kg/m   Physical Exam HENT:     Head: Atraumatic.  Cardiovascular:     Rate and Rhythm: Normal rate.  Pulmonary:     Breath sounds: Normal breath sounds.  Abdominal:     Palpations: Abdomen is soft.     Tenderness: There is no abdominal tenderness.  Skin:    General: Skin is warm.     Capillary Refill: Capillary refill takes less than 2 seconds.  Neurological:     General: No focal deficit present.     Mental Status: She is alert.      ED Treatments / Results  Labs (all labs ordered are listed, but only abnormal results are displayed) Labs Reviewed  COMPREHENSIVE METABOLIC PANEL - Abnormal; Notable for the following components:      Result Value   Sodium 134 (*)    CO2 21 (*)    Glucose, Bld 101 (*)    Total Protein 8.9 (*)    All other components within normal limits  CBC - Abnormal; Notable for the following components:   RBC 5.12 (*)    Hemoglobin 9.2 (*)    HCT 32.4 (*)    MCV 63.3 (*)    MCH 18.0 (*)    MCHC 28.4 (*)    RDW 19.5 (*)    All other components within normal limits  LIPASE, BLOOD  URINALYSIS, ROUTINE W REFLEX MICROSCOPIC  PREGNANCY, URINE    EKG None  Radiology No results found.  Procedures Procedures (including critical care time)  Medications Ordered in ED Medications  sodium chloride 0.9 % bolus 1,000 mL (1,000 mLs Intravenous New Bag/Given 04/07/18 2352)  ondansetron (ZOFRAN) injection 4 mg (4 mg Intravenous Given 04/07/18 2104)  sodium chloride 0.9 % bolus 1,000 mL (0 mLs Intravenous Stopped 04/07/18 2205)     Initial Impression / Assessment and Plan / ED Course  I have reviewed the triage vital signs and the nursing notes.  Pertinent labs & imaging results that were available during my care of the patient were reviewed by me and considered in my medical decision making (see chart for details).     Patient with  nausea vomiting diarrhea.  Lab work overall reassuring.  However urinalysis and urine pregnancy still pending.  Passing oral trial.  Wilfrid Lund turn care over to Dr. Nicanor Alcon  Final Clinical Impressions(s) / ED Diagnoses   Final diagnoses:  Nausea vomiting and diarrhea    ED Discharge Orders    None  Benjiman Core, MD 04/07/18 [phone]

## 2018-04-07 NOTE — ED Notes (Signed)
Pt reports waking up today with N/V/D. Pt reports 5 bouts of emesis and 2 bouts of diarrhea. Pt denies any recent antibiotic usage.

## 2018-04-08 LAB — URINALYSIS, ROUTINE W REFLEX MICROSCOPIC
Bilirubin Urine: NEGATIVE
Glucose, UA: 100 mg/dL — AB
Hgb urine dipstick: NEGATIVE
Ketones, ur: NEGATIVE mg/dL
Leukocytes, UA: NEGATIVE
Nitrite: NEGATIVE
Protein, ur: NEGATIVE mg/dL
Specific Gravity, Urine: 1.015 (ref 1.005–1.030)
pH: 6.5 (ref 5.0–8.0)

## 2018-04-08 LAB — PREGNANCY, URINE: Preg Test, Ur: NEGATIVE

## 2018-04-08 MED ORDER — ONDANSETRON 4 MG PO TBDP: 4.0000 mg | ORAL_TABLET | Freq: Three times a day (TID) | ORAL | 0 refills | Status: DC | PRN

## 2018-04-08 NOTE — ED Notes (Signed)
Patient verbalizes understanding of discharge instructions. Opportunity for questioning and answers were provided. Armband removed by staff, pt discharged from ED home via POV.  

## 2018-09-16 ENCOUNTER — Other Ambulatory Visit: Payer: Self-pay

## 2018-09-16 ENCOUNTER — Encounter (HOSPITAL_BASED_OUTPATIENT_CLINIC_OR_DEPARTMENT_OTHER): Payer: Self-pay

## 2018-09-16 ENCOUNTER — Emergency Department (HOSPITAL_BASED_OUTPATIENT_CLINIC_OR_DEPARTMENT_OTHER)
Admission: EM | Admit: 2018-09-16 | Discharge: 2018-09-16 | Disposition: A | Discharge status: Home / Self Care | Payer: Medicaid Other | Attending: Emergency Medicine | Admitting: Emergency Medicine

## 2018-09-16 DIAGNOSIS — B029 Zoster without complications: Secondary | ICD-10-CM | POA: Insufficient documentation

## 2018-09-16 DIAGNOSIS — Z79899 Other long term (current) drug therapy: Secondary | ICD-10-CM | POA: Insufficient documentation

## 2018-09-16 DIAGNOSIS — Z21 Asymptomatic human immunodeficiency virus [HIV] infection status: Secondary | ICD-10-CM | POA: Insufficient documentation

## 2018-09-16 DIAGNOSIS — R21 Rash and other nonspecific skin eruption: Secondary | ICD-10-CM | POA: Diagnosis present

## 2018-09-16 MED ORDER — VALACYCLOVIR HCL 1 G PO TABS: 1000.0000 mg | ORAL_TABLET | Freq: Three times a day (TID) | ORAL | 0 refills | Status: AC

## 2018-09-16 MED FILL — valACYclovir HCL 1 GM TABS: 1 | 7 days supply | Qty: 21 | Fill #0

## 2018-09-16 NOTE — ED Provider Notes (Signed)
[facility] EMERGENCY DEPARTMENT Provider Note   CSN: [id number] Arrival date & time: 09/16/18  1258     History   Chief Complaint Chief Complaint  Patient presents with  . Rash    HPI Mahari Vankirk is a 28 y.o. female.     The history is provided by the patient and medical records. No language interpreter was used.   Tsuruko Murtha is a 28 y.o. female who presents to the Emergency Department complaining of rash. She presents to the emergency department for evaluation of rash to her left forehead. She describes it as itchy and mildly painful. She initially thought it was related to some hair extensions and she removed them and applied Benadryl cream with no improvement in symptoms. She called telehealth yesterday and was started on methylprednisolone. Today she presents because she now notices lesions on her left eyelid. She has no eye pain or change of vision. She denies any fevers, chest pain, shortness of breath, nausea, vomiting.  Past Medical History:  Diagnosis Date  . Anemia   . History of syphilis    Treated during last pregnancy   . HIV (human immunodeficiency virus infection) ([place])   . HIV infection ([place])   . HSV infection   . Sickle cell trait [facility])     Patient Active Problem List   Diagnosis Date Noted  . Pregnancy 07/09/2017  . Encounter for elective induction of labor 07/08/2017  . Nausea & vomiting 07/05/2017  . Pregnancy and infectious disease in third trimester 02/11/2017  . Hypertension in pregnancy 04/15/2015  . Encounter for long-term (current) use of other medications 10/12/2013  . Health care maintenance 10/12/2013  . 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 Depression 09/05/2012  . Syphilis contact, treated 01/07/2012  . HIV disease ([place]) 09/01/2011    Past Surgical History:  Procedure Laterality Date  . DILATION AND CURETTAGE, DIAGNOSTIC / THERAPEUTIC       OB History    Gravida  3   Para  2   Term  2   Preterm      AB  1   Living  2     SAB      TAB  1   Ectopic      Multiple  0   Live Births  2            Home Medications    Prior to Admission medications   Medication Sig Start Date End Date Taking? Authorizing Provider  DESCOVY 200-25 MG tablet Take 1 tablet by mouth daily. 06/14/17   [provider]  dolutegravir (TIVICAY) 50 MG tablet Take 1 tablet (50 mg total) by mouth daily. 04/19/17    Callas, NP  ibuprofen (ADVIL,MOTRIN) 600 MG tablet Take 1 tablet (600 mg total) by mouth every 6 (six) hours. 07/10/17   Olga Millers, MD  ondansetron (ZOFRAN-ODT) 4 MG disintegrating tablet Take 1 tablet (4 mg total) by mouth every 8 (eight) hours as needed for nausea or vomiting. 04/08/18   Davonna Belling, MD  Prenatal Vit-Fe Fumarate-FA (PRENATAL MULTIVITAMIN) TABS tablet Take 1 tablet by mouth daily at 12 noon.    [provider]  valACYclovir (VALTREX) 1000 MG tablet Take 1 tablet (1,000 mg total) by mouth 3 (three) times daily for 7 days. 09/16/18 09/23/18  Quintella Reichert, MD    Family History Family History  Problem Relation Age of Onset  . Lupus Mother   . Hypertension Mother   . Heart  failure Mother   . Heart attack Mother   . Sickle cell trait Mother   . Scleroderma Mother   . Hypertension Father   . Arthritis Maternal Grandmother   . Diabetes Maternal Grandmother   . Hypertension Maternal Grandmother   . Stroke Maternal Grandmother   . Heart failure Maternal Grandmother   . Heart attack Maternal Grandmother   . Asthma Maternal Grandmother   . Arthritis Maternal Grandfather   . Diabetes Maternal Grandfather   . Hypertension Maternal Grandfather   . Stroke Maternal Grandfather   . Heart failure Maternal Grandfather   . Heart attack Maternal Grandfather   . Kidney disease Maternal Grandfather   . Other Neg Hx     Social History Social History   Tobacco Use  . Smoking status: Never Smoker  . Smokeless tobacco: Never Used   Substance Use Topics  . Alcohol use: Yes    Comment: Socially   . Drug use: No     Allergies   Patient has no known allergies.   Review of Systems Review of Systems  All other systems reviewed and are negative.    Physical Exam Updated Vital Signs BP (!) 150/102 (BP Location: Left Arm)   Pulse 85   Temp 98.4 F (36.9 C) (Oral)   Resp 18   Ht 5\' 4"  (1.626 m)   Wt 77.1 kg   LMP 09/07/2018   SpO2 100%   BMI 29.18 kg/m   Physical Exam Vitals signs and nursing note reviewed.  Constitutional:      Appearance: She is well-developed.  HENT:     Head: Normocephalic and atraumatic.     Comments: Vesicular rash with clear vesicles to the left forehead and left upper eyelid.    Right Ear: Tympanic membrane normal.     Left Ear: Tympanic membrane normal.     Mouth/Throat:     Mouth: Mucous membranes are moist.  Eyes:     Extraocular Movements: Extraocular movements intact.     Conjunctiva/sclera: Conjunctivae normal.     Pupils: Pupils are equal, round, and reactive to light.  Cardiovascular:     Rate and Rhythm: Normal rate and regular rhythm.     Heart sounds: No murmur.  Pulmonary:     Effort: Pulmonary effort is normal. No respiratory distress.     Breath sounds: Normal breath sounds.  Abdominal:     Palpations: Abdomen is soft.     Tenderness: There is no abdominal tenderness. There is no guarding or rebound.  Musculoskeletal:        General: No tenderness.  Skin:    General: Skin is warm and dry.     Capillary Refill: Capillary refill takes less than 2 seconds.  Neurological:     Mental Status: She is alert and oriented to person, place, and time.  Psychiatric:        Behavior: Behavior normal.      ED Treatments / Results  Labs (all labs ordered are listed, but only abnormal results are displayed) Labs Reviewed - No data to display  EKG    Radiology No results found.  Procedures Procedures (including critical care time)  Medications Ordered  in ED Medications - No data to display   Initial Impression / Assessment and Plan / ED Course  I have reviewed the triage vital signs and the nursing notes.  Pertinent labs & imaging results that were available during my care of the patient were reviewed by me and considered in my  medical decision making (see chart for details).        Patient here for evaluation of itchy rash to her left forehead, history of HIV that is well-controlled on medications. Examination is consistent with shingles and a V1 distribution. There is no evidence of ocular involvement or secondary bacterial infection. Discussed with patient home care for shingles with infection control precautions. Discussed outpatient follow-up and return precautions.  Final Clinical Impressions(s) / ED Diagnoses   Final diagnoses:  Herpes zoster without complication    ED Discharge Orders         Ordered    valACYclovir (VALTREX) 1000 MG tablet  3 times daily     09/16/18 1346           Tilden Fossaees, Shawny Borkowski, MD 09/16/18 1354

## 2018-09-16 NOTE — ED Triage Notes (Signed)
C/o rash to face x 3 days-NAD-steady gait

## 2019-05-03 ENCOUNTER — Other Ambulatory Visit: Payer: Self-pay

## 2019-05-04 ENCOUNTER — Ambulatory Visit: Payer: Self-pay

## 2019-05-09 DIAGNOSIS — H40033 Anatomical narrow angle, bilateral: Secondary | ICD-10-CM | POA: Diagnosis not present

## 2019-05-09 DIAGNOSIS — G44209 Tension-type headache, unspecified, not intractable: Secondary | ICD-10-CM | POA: Diagnosis not present

## 2019-05-18 DIAGNOSIS — H5213 Myopia, bilateral: Secondary | ICD-10-CM | POA: Diagnosis not present

## 2019-06-03 DIAGNOSIS — H16223 Keratoconjunctivitis sicca, not specified as Sjogren's, bilateral: Secondary | ICD-10-CM | POA: Diagnosis not present

## 2020-03-31 ENCOUNTER — Other Ambulatory Visit: Payer: Self-pay

## 2020-03-31 ENCOUNTER — Ambulatory Visit
Admission: EM | Admit: 2020-03-31 | Discharge: 2020-03-31 | Disposition: A | Discharge status: Home / Self Care | Payer: Medicaid Other

## 2020-03-31 DIAGNOSIS — B373 Candidiasis of vulva and vagina: Secondary | ICD-10-CM

## 2020-03-31 DIAGNOSIS — A6000 Herpesviral infection of urogenital system, unspecified: Secondary | ICD-10-CM

## 2020-03-31 DIAGNOSIS — B3731 Acute candidiasis of vulva and vagina: Secondary | ICD-10-CM

## 2020-03-31 MED ORDER — FLUCONAZOLE 200 MG PO TABS: 200.0000 mg | ORAL_TABLET | Freq: Once | ORAL | 0 refills | Status: AC

## 2020-03-31 MED ORDER — VALACYCLOVIR HCL 500 MG PO TABS: 500.0000 mg | ORAL_TABLET | Freq: Two times a day (BID) | ORAL | 0 refills | Status: AC

## 2020-03-31 NOTE — Discharge Instructions (Signed)
Valtrex 2 times a day x 3 days Diflucan once today, the second on Wednesday if needed - may take up to 1 wk for complete resolution.

## 2020-03-31 NOTE — ED Triage Notes (Signed)
Pt c/o genital herpes outbreak onset approx 2 days ago. Pt states she had a yeast infection several days ago and used monistat, then noticed herpetic sores.  Denies fever, n/v/d, dysuria

## 2020-03-31 NOTE — ED Provider Notes (Signed)
[facility]    CSN: [id number] Arrival date & time: 03/31/20  1305      History   Chief Complaint Chief Complaint  Patient presents with  . Rash    HPI Tonya Weeks is a 30 y.o. female  With history as below presenting for general herpes outbreak.  States it began about 2 days ago.  Last outbreak was several months ago.  Patient also noting yeast infection: Thick Eichholz discharge with vaginal pruritus.  Denying pelvic pain, urinary symptoms.  Declining STI testing.  Past Medical History:  Diagnosis Date  . Anemia   . History of syphilis    Treated during last pregnancy   . HIV (human immunodeficiency virus infection) ([place])   . HIV infection ([place])   . HSV infection   . Sickle cell trait [facility])     Patient Active Problem List   Diagnosis Date Noted  . Pregnancy 07/09/2017  . Encounter for elective induction of labor 07/08/2017  . Nausea & vomiting 07/05/2017  . Pregnancy and infectious disease in third trimester 02/11/2017  . Hypertension in pregnancy 04/15/2015  . Encounter for long-term (current) use of other medications 10/12/2013  . Health care maintenance 10/12/2013  . 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 Depression 09/05/2012  . Syphilis contact, treated 01/07/2012  . HIV disease ([place]) 09/01/2011    Past Surgical History:  Procedure Laterality Date  . DILATION AND CURETTAGE, DIAGNOSTIC / THERAPEUTIC      OB History    Gravida  3   Para  2   Term  2   Preterm      AB  1   Living  2     SAB      IAB  1   Ectopic      Multiple  0   Live Births  2            Home Medications    Prior to Admission medications   Medication Sig Start Date End Date Taking? Authorizing Provider  DESCOVY 200-25 MG tablet Take 1 tablet by mouth daily. 06/14/17  Yes [provider]  dolutegravir (TIVICAY) 50 MG tablet Take 1 tablet (50 mg total) by mouth daily. 04/19/17  Yes Byars Callas, NP  valACYclovir  (VALTREX) 500 MG tablet Take 1 tablet (500 mg total) by mouth 2 (two) times daily for 3 days. 03/31/20 04/03/20 Yes Hall-Potvin, [name], PA-C  Emtricitabine-Tenofovir DF (TRUVADA PO) Truvada    [provider]  ibuprofen (ADVIL,MOTRIN) 600 MG tablet Take 1 tablet (600 mg total) by mouth every 6 (six) hours. 07/10/17   Olga Millers, MD  ondansetron (ZOFRAN-ODT) 4 MG disintegrating tablet Take 1 tablet (4 mg total) by mouth every 8 (eight) hours as needed for nausea or vomiting. 04/08/18   Davonna Belling, MD  Prenatal Vit-Fe Fumarate-FA (PRENATAL MULTIVITAMIN) TABS tablet Take 1 tablet by mouth daily at 12 noon.    [provider]    Family History Family History  Problem Relation Age of Onset  . Lupus Mother   . Hypertension Mother   . Heart failure Mother   . Heart attack Mother   . Sickle cell trait Mother   . Scleroderma Mother   . Hypertension Father   . Arthritis Maternal Grandmother   . Diabetes Maternal Grandmother   . Hypertension Maternal Grandmother   . Stroke Maternal Grandmother   . Heart failure Maternal Grandmother   . Heart attack Maternal Grandmother   .  Asthma Maternal Grandmother   . Arthritis Maternal Grandfather   . Diabetes Maternal Grandfather   . Hypertension Maternal Grandfather   . Stroke Maternal Grandfather   . Heart failure Maternal Grandfather   . Heart attack Maternal Grandfather   . Kidney disease Maternal Grandfather   . Other Neg Hx     Social History Social History   Tobacco Use  . Smoking status: Never Smoker  . Smokeless tobacco: Never Used  Vaping Use  . Vaping Use: Never used  Substance Use Topics  . Alcohol use: Yes    Comment: Socially   . Drug use: No     Allergies   Patient has no known allergies.   Review of Systems Review of Systems  Constitutional: Negative for fatigue and fever.  Respiratory: Negative for cough and shortness of breath.   Cardiovascular: Negative for chest pain and palpitations.   Gastrointestinal: Negative for constipation and diarrhea.  Genitourinary: Positive for genital sores and vaginal discharge. Negative for dysuria, flank pain, frequency, hematuria, pelvic pain, urgency, vaginal bleeding and vaginal pain.     Physical Exam Triage Vital Signs ED Triage Vitals  Enc Vitals Group     BP 03/31/20 1420 (!) 183/133     Pulse Rate 03/31/20 1420 94     Resp 03/31/20 1420 17     Temp 03/31/20 1420 98.3 F (36.8 C)     Temp Source 03/31/20 1420 Oral     SpO2 03/31/20 1420 98 %     Weight --      Height --      Head Circumference --      Peak Flow --      Pain Score 03/31/20 1416 0     Pain Loc --      Pain Edu? --      Excl. in GC? --    No data found.  Updated Vital Signs BP (!) 183/133 (BP Location: Left Arm)   Pulse 94   Temp 98.3 F (36.8 C) (Oral)   Resp 17   LMP 03/12/2020   SpO2 98%   Visual Acuity Right Eye Distance:   Left Eye Distance:   Bilateral Distance:    Right Eye Near:   Left Eye Near:    Bilateral Near:     Physical Exam Constitutional:      General: She is not in acute distress. HENT:     Head: Normocephalic and atraumatic.  Eyes:     General: No scleral icterus.    Pupils: Pupils are equal, round, and reactive to light.  Cardiovascular:     Rate and Rhythm: Normal rate.  Pulmonary:     Effort: Pulmonary effort is normal.  Genitourinary:    Comments: Pt declined Skin:    Coloration: Skin is not jaundiced or pale.  Neurological:     Mental Status: She is alert and oriented to person, place, and time.      UC Treatments / Results  Labs (all labs ordered are listed, but only abnormal results are displayed) Labs Reviewed - No data to display  EKG   Radiology No results found.  Procedures Procedures (including critical care time)  Medications Ordered in UC Medications - No data to display  Initial Impression / Assessment and Plan / UC Course  I have reviewed the triage vital signs and the nursing  notes.  Pertinent labs & imaging results that were available during my care of the patient were reviewed by me and considered in  my medical decision making (see chart for details).     H&P concerning for yeast vaginitis and genital herpes outbreak.  Will treat supportively as below, follow-up with PCP for further evaluation management.  Return precautions discussed, pt verbalized understanding and is agreeable to plan. Final Clinical Impressions(s) / UC Diagnoses   Final diagnoses:  Yeast vaginitis  Genital herpes simplex, unspecified site     Discharge Instructions     Valtrex 2 times a day x 3 days Diflucan once today, the second on Wednesday if needed - may take up to 1 wk for complete resolution.    ED Prescriptions    Medication Sig Dispense Auth. Provider   valACYclovir (VALTREX) 500 MG tablet Take 1 tablet (500 mg total) by mouth 2 (two) times daily for 3 days. 6 tablet Hall-Potvin, [place], PA-C   fluconazole (DIFLUCAN) 200 MG tablet Take 1 tablet (200 mg total) by mouth once for 1 dose. May repeat in 72 hours if needed 2 tablet Hall-Potvin, [place], PA-C     PDMP not reviewed this encounter.   Odette Fraction [place], [place] 04/01/20 1951

## 2020-04-09 ENCOUNTER — Other Ambulatory Visit: Payer: Medicaid Other

## 2020-07-28 ENCOUNTER — Other Ambulatory Visit: Payer: Self-pay

## 2020-07-28 ENCOUNTER — Ambulatory Visit
Admission: EM | Admit: 2020-07-28 | Discharge: 2020-07-28 | Disposition: A | Discharge status: Home / Self Care | Payer: Medicaid Other | Attending: Emergency Medicine | Admitting: Emergency Medicine

## 2020-07-28 ENCOUNTER — Encounter: Payer: Self-pay | Admitting: Emergency Medicine

## 2020-07-28 DIAGNOSIS — N390 Urinary tract infection, site not specified: Secondary | ICD-10-CM | POA: Diagnosis not present

## 2020-07-28 LAB — POCT URINALYSIS DIP (MANUAL ENTRY)
Glucose, UA: 100 mg/dL — AB
Nitrite, UA: POSITIVE — AB
Protein Ur, POC: >=300 mg/dL — AB
Spec Grav, UA: 1.02 (ref 1.010–1.025)
Urobilinogen, UA: 2 E.U./dL — AB
pH, UA: 5 (ref 5.0–8.0)

## 2020-07-28 LAB — POCT URINE PREGNANCY: Preg Test, Ur: NEGATIVE

## 2020-07-28 MED ORDER — CEPHALEXIN 500 MG PO CAPS: 500.0000 mg | ORAL_CAPSULE | Freq: Two times a day (BID) | ORAL | 0 refills | Status: AC

## 2020-07-28 NOTE — ED Provider Notes (Signed)
[facility]    CSN: [id number] Arrival date & time: 07/28/20  1108      History   Chief Complaint Chief Complaint  Patient presents with  . Dysuria    HPI Tonya Weeks is a 30 y.o. female history of HIV, presenting today for evaluation of possible UTI.  Reports associated dysuria for approximately 5 days.  Using Azo.  Denies history of UTI.  Denies vaginal symptoms of discharge itching or irritation.  Last menstrual cycle approximately 1 month ago, due to start cycle next week.  HPI  Past Medical History:  Diagnosis Date  . Anemia   . History of syphilis    Treated during last pregnancy   . HIV (human immunodeficiency virus infection) (HCC)   . HIV infection (HCC)   . HSV infection   . Sickle cell trait [facility])     Patient Active Problem List   Diagnosis Date Noted  . Pregnancy 07/09/2017  . Encounter for elective induction of labor 07/08/2017  . Nausea & vomiting 07/05/2017  . Pregnancy and infectious disease in third trimester 02/11/2017  . Hypertension in pregnancy 04/15/2015  . Encounter for long-term (current) use of other medications 10/12/2013  . Health care maintenance 10/12/2013  . 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 Depression 09/05/2012  . Syphilis contact, treated 01/07/2012  . HIV disease (HCC) 09/01/2011    Past Surgical History:  Procedure Laterality Date  . DILATION AND CURETTAGE, DIAGNOSTIC / THERAPEUTIC      OB History    Gravida  3   Para  2   Term  2   Preterm      AB  1   Living  2     SAB      IAB  1   Ectopic      Multiple  0   Live Births  2            Home Medications    Prior to Admission medications   Medication Sig Start Date End Date Taking? Authorizing Provider  cephALEXin (KEFLEX) 500 MG capsule Take 1 capsule (500 mg total) by mouth 2 (two) times daily for 5 days. 07/28/20 08/02/20 Yes Raymon Schlarb C, PA-C  DESCOVY 200-25 MG tablet Take 1 tablet by mouth daily. 06/14/17    [provider]  dolutegravir (TIVICAY) 50 MG tablet Take 1 tablet (50 mg total) by mouth daily. 04/19/17   Blanchard Kelch, NP  Emtricitabine-Tenofovir DF (TRUVADA PO) Truvada    [provider]  ibuprofen (ADVIL,MOTRIN) 600 MG tablet Take 1 tablet (600 mg total) by mouth every 6 (six) hours. 07/10/17   Levi Aland, MD  ondansetron (ZOFRAN-ODT) 4 MG disintegrating tablet Take 1 tablet (4 mg total) by mouth every 8 (eight) hours as needed for nausea or vomiting. 04/08/18   Benjiman Core, MD  Prenatal Vit-Fe Fumarate-FA (PRENATAL MULTIVITAMIN) TABS tablet Take 1 tablet by mouth daily at 12 noon.    [provider]    Family History Family History  Problem Relation Age of Onset  . Lupus Mother   . Hypertension Mother   . Heart failure Mother   . Heart attack Mother   . Sickle cell trait Mother   . Scleroderma Mother   . Hypertension Father   . Arthritis Maternal Grandmother   . Diabetes Maternal Grandmother   . Hypertension Maternal Grandmother   . Stroke Maternal Grandmother   . Heart failure Maternal Grandmother   . Heart attack  Maternal Grandmother   . Asthma Maternal Grandmother   . Arthritis Maternal Grandfather   . Diabetes Maternal Grandfather   . Hypertension Maternal Grandfather   . Stroke Maternal Grandfather   . Heart failure Maternal Grandfather   . Heart attack Maternal Grandfather   . Kidney disease Maternal Grandfather   . Other Neg Hx     Social History Social History   Tobacco Use  . Smoking status: Never Smoker  . Smokeless tobacco: Never Used  Vaping Use  . Vaping Use: Never used  Substance Use Topics  . Alcohol use: Yes    Comment: Socially   . Drug use: No     Allergies   Patient has no known allergies.   Review of Systems Review of Systems  Constitutional: Negative for fever.  Respiratory: Negative for shortness of breath.   Cardiovascular: Negative for chest pain.  Gastrointestinal: Negative for  abdominal pain, diarrhea, nausea and vomiting.  Genitourinary: Positive for dysuria, frequency and urgency. Negative for flank pain, genital sores, hematuria, menstrual problem, vaginal bleeding, vaginal discharge and vaginal pain.  Musculoskeletal: Negative for back pain.  Skin: Negative for rash.  Neurological: Negative for dizziness, light-headedness and headaches.     Physical Exam Triage Vital Signs ED Triage Vitals [07/28/20 1134]  Enc Vitals Group     BP (!) 169/104     Pulse Rate (!) 105     Resp 18     Temp 98.1 F (36.7 C)     Temp Source Oral     SpO2 98 %     Weight      Height      Head Circumference      Peak Flow      Pain Score      Pain Loc      Pain Edu?      Excl. in GC?    No data found.  Updated Vital Signs BP (!) 169/104 (BP Location: Left Arm)   Pulse (!) 105   Temp 98.1 F (36.7 C) (Oral)   Resp 18   SpO2 98%   Visual Acuity Right Eye Distance:   Left Eye Distance:   Bilateral Distance:    Right Eye Near:   Left Eye Near:    Bilateral Near:     Physical Exam Vitals and nursing note reviewed.  Constitutional:      Appearance: She is well-developed.     Comments: No acute distress  HENT:     Head: Normocephalic and atraumatic.     Nose: Nose normal.  Eyes:     Conjunctiva/sclera: Conjunctivae normal.  Cardiovascular:     Rate and Rhythm: Normal rate.  Pulmonary:     Effort: Pulmonary effort is normal. No respiratory distress.  Abdominal:     General: There is no distension.  Musculoskeletal:        General: Normal range of motion.     Cervical back: Neck supple.  Skin:    General: Skin is warm and dry.  Neurological:     Mental Status: She is alert and oriented to person, place, and time.      UC Treatments / Results  Labs (all labs ordered are listed, but only abnormal results are displayed) Labs Reviewed  POCT URINALYSIS DIP (MANUAL ENTRY) - Abnormal; Notable for the following components:      Result Value    Color, UA orange (*)    Clarity, UA cloudy (*)    Glucose, UA =100 (*)  Bilirubin, UA small (*)    Ketones, POC UA trace (5) (*)    Blood, UA moderate (*)    Protein Ur, POC >=300 (*)    Urobilinogen, UA 2.0 (*)    Nitrite, UA Positive (*)    Leukocytes, UA Large (3+) (*)    All other components within normal limits  URINE CULTURE  POCT URINE PREGNANCY    EKG   Radiology No results found.  Procedures Procedures (including critical care time)  Medications Ordered in UC Medications - No data to display  Initial Impression / Assessment and Plan / UC Course  I have reviewed the triage vital signs and the nursing notes.  Pertinent labs & imaging results that were available during my care of the patient were reviewed by me and considered in my medical decision making (see chart for details).     UA pan positive likely from Azo, urine culture pending.  Treating for UTI with Keflex, alter therapy based on culture results.  Push fluids.  Discussed strict return precautions. Patient verbalized understanding and is agreeable with plan.  Final Clinical Impressions(s) / UC Diagnoses   Final diagnoses:  Lower urinary tract infection, acute     Discharge Instructions     Urine showed evidence of infection. We are treating you with keflex- twice daily x 5 days. Be sure to take full course. Stay hydrated- urine should be pale yellow to clear. May continue azo for relief of burning while infection is being cleared.   Please return or follow up with your primary provider if symptoms not improving with treatment. Please return sooner if you have worsening of symptoms or develop fever, nausea, vomiting, abdominal pain, back pain, lightheadedness, dizziness.    ED Prescriptions    Medication Sig Dispense Auth. Provider   cephALEXin (KEFLEX) 500 MG capsule Take 1 capsule (500 mg total) by mouth 2 (two) times daily for 5 days. 10 capsule Jarelly Rinck, [place] C, PA-C     PDMP not  reviewed this encounter.   Lew Dawes, [place] 07/28/20 1151

## 2020-07-28 NOTE — Discharge Instructions (Addendum)
Urine showed evidence of infection. We are treating you with keflex- twice daily x 5 days. Be sure to take full course. Stay hydrated- urine should be pale yellow to clear. May continue azo for relief of burning while infection is being cleared.   Please return or follow up with your primary provider if symptoms not improving with treatment. Please return sooner if you have worsening of symptoms or develop fever, nausea, vomiting, abdominal pain, back pain, lightheadedness, dizziness.

## 2020-07-28 NOTE — ED Triage Notes (Signed)
Pt here for dysuria x 5 days; pt sts taking AZO without relief

## 2020-07-29 LAB — URINE CULTURE: Culture: NO GROWTH

## 2020-08-09 ENCOUNTER — Ambulatory Visit
Admission: EM | Admit: 2020-08-09 | Discharge: 2020-08-09 | Disposition: A | Discharge status: Home / Self Care | Payer: Medicaid Other | Attending: Internal Medicine | Admitting: Internal Medicine

## 2020-08-09 ENCOUNTER — Other Ambulatory Visit: Payer: Self-pay

## 2020-08-09 ENCOUNTER — Ambulatory Visit
Admission: EM | Admit: 2020-08-09 | Discharge: 2020-08-09 | Disposition: A | Discharge status: Home / Self Care | Payer: Medicaid Other

## 2020-08-09 DIAGNOSIS — R102 Pelvic and perineal pain: Secondary | ICD-10-CM | POA: Diagnosis not present

## 2020-08-09 LAB — POCT URINE PREGNANCY: Preg Test, Ur: NEGATIVE

## 2020-08-09 LAB — POCT URINALYSIS DIP (MANUAL ENTRY)
Bilirubin, UA: NEGATIVE
Glucose, UA: NEGATIVE mg/dL
Ketones, POC UA: NEGATIVE mg/dL
Nitrite, UA: NEGATIVE
Protein Ur, POC: 100 mg/dL — AB
Spec Grav, UA: 1.02 (ref 1.010–1.025)
Urobilinogen, UA: 0.2 E.U./dL
pH, UA: 6.5 (ref 5.0–8.0)

## 2020-08-09 MED ORDER — NITROFURANTOIN MONOHYD MACRO 100 MG PO CAPS: 100.0000 mg | ORAL_CAPSULE | Freq: Two times a day (BID) | ORAL | 0 refills | Status: AC

## 2020-08-09 NOTE — ED Triage Notes (Signed)
Pt present pelvic pain with urinary frequency and pressure. Symptoms started a week ago.

## 2020-08-09 NOTE — ED Provider Notes (Signed)
[facility]    CSN: [id number] Arrival date & time: 08/09/20  1453      History   Chief Complaint Chief Complaint  Patient presents with  . Pelvic Pain    HPI Tonya Weeks is a 30 y.o. female presenting today for evaluation of pelvic pain.  Patient reports that she has had continued pressure and discomfort in her lower back and pelvis area which has been going on for approximately 2 weeks.  She reports associated urinary frequency, but denies dysuria.  Was seen here initially earlier in May initially treated for UTI, had taken Azo and UA positive, urine culture negative.  Denies vaginal symptoms of discharge itching or irritation.  HPI  Past Medical History:  Diagnosis Date  . Anemia   . History of syphilis    Treated during last pregnancy   . HIV (human immunodeficiency virus infection) (HCC)   . HIV infection (HCC)   . HSV infection   . Sickle cell trait [facility])     Patient Active Problem List   Diagnosis Date Noted  . Pregnancy 07/09/2017  . Encounter for elective induction of labor 07/08/2017  . Nausea & vomiting 07/05/2017  . Pregnancy and infectious disease in third trimester 02/11/2017  . Hypertension in pregnancy 04/15/2015  . Encounter for long-term (current) use of other medications 10/12/2013  . Health care maintenance 10/12/2013  . 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 Depression 09/05/2012  . Syphilis contact, treated 01/07/2012  . HIV disease (HCC) 09/01/2011    Past Surgical History:  Procedure Laterality Date  . DILATION AND CURETTAGE, DIAGNOSTIC / THERAPEUTIC      OB History    Gravida  3   Para  2   Term  2   Preterm      AB  1   Living  2     SAB      IAB  1   Ectopic      Multiple  0   Live Births  2            Home Medications    Prior to Admission medications   Medication Sig Start Date End Date Taking? Authorizing Provider  nitrofurantoin, macrocrystal-monohydrate, (MACROBID) 100  MG capsule Take 1 capsule (100 mg total) by mouth 2 (two) times daily for 5 days. 08/09/20 08/14/20 Yes Jalene Lacko C, PA-C  DESCOVY 200-25 MG tablet Take 1 tablet by mouth daily. 06/14/17   [provider]  dolutegravir (TIVICAY) 50 MG tablet Take 1 tablet (50 mg total) by mouth daily. 04/19/17   Blanchard Kelch, NP  Emtricitabine-Tenofovir DF (TRUVADA PO) Truvada    [provider]  ibuprofen (ADVIL,MOTRIN) 600 MG tablet Take 1 tablet (600 mg total) by mouth every 6 (six) hours. 07/10/17   Levi Aland, MD  ondansetron (ZOFRAN-ODT) 4 MG disintegrating tablet Take 1 tablet (4 mg total) by mouth every 8 (eight) hours as needed for nausea or vomiting. 04/08/18   Benjiman Core, MD  Prenatal Vit-Fe Fumarate-FA (PRENATAL MULTIVITAMIN) TABS tablet Take 1 tablet by mouth daily at 12 noon.    [provider]    Family History Family History  Problem Relation Age of Onset  . Lupus Mother   . Hypertension Mother   . Heart failure Mother   . Heart attack Mother   . Sickle cell trait Mother   . Scleroderma Mother   . Hypertension Father   . Arthritis Maternal Grandmother   . Diabetes  Maternal Grandmother   . Hypertension Maternal Grandmother   . Stroke Maternal Grandmother   . Heart failure Maternal Grandmother   . Heart attack Maternal Grandmother   . Asthma Maternal Grandmother   . Arthritis Maternal Grandfather   . Diabetes Maternal Grandfather   . Hypertension Maternal Grandfather   . Stroke Maternal Grandfather   . Heart failure Maternal Grandfather   . Heart attack Maternal Grandfather   . Kidney disease Maternal Grandfather   . Other Neg Hx     Social History Social History   Tobacco Use  . Smoking status: Never Smoker  . Smokeless tobacco: Never Used  Vaping Use  . Vaping Use: Never used  Substance Use Topics  . Alcohol use: Yes    Comment: Socially   . Drug use: No     Allergies   Patient has no known allergies.   Review of  Systems Review of Systems  Constitutional: Negative for fever.  Respiratory: Negative for shortness of breath.   Cardiovascular: Negative for chest pain.  Gastrointestinal: Negative for abdominal pain, diarrhea, nausea and vomiting.  Genitourinary: Positive for frequency and pelvic pain. Negative for dysuria, flank pain, genital sores, hematuria, menstrual problem, vaginal bleeding, vaginal discharge and vaginal pain.  Musculoskeletal: Negative for back pain.  Skin: Negative for rash.  Neurological: Negative for dizziness, light-headedness and headaches.     Physical Exam Triage Vital Signs ED Triage Vitals  Enc Vitals Group     BP 08/09/20 1532 (!) 166/117     Pulse Rate 08/09/20 1532 80     Resp 08/09/20 1532 18     Temp 08/09/20 1532 97.9 F (36.6 C)     Temp Source 08/09/20 1532 Oral     SpO2 08/09/20 1532 99 %     Weight --      Height --      Head Circumference --      Peak Flow --      Pain Score 08/09/20 1533 0     Pain Loc --      Pain Edu? --      Excl. in GC? --    No data found.  Updated Vital Signs BP (!) 166/117 (BP Location: Right Arm)   Pulse 80   Temp 97.9 F (36.6 C) (Oral)   Resp 18   LMP 08/01/2020   SpO2 99%   Visual Acuity Right Eye Distance:   Left Eye Distance:   Bilateral Distance:    Right Eye Near:   Left Eye Near:    Bilateral Near:     Physical Exam Vitals and nursing note reviewed.  Constitutional:      Appearance: She is well-developed.     Comments: No acute distress  HENT:     Head: Normocephalic and atraumatic.     Nose: Nose normal.  Eyes:     Conjunctiva/sclera: Conjunctivae normal.  Cardiovascular:     Rate and Rhythm: Normal rate.  Pulmonary:     Effort: Pulmonary effort is normal. No respiratory distress.  Abdominal:     General: There is no distension.  Musculoskeletal:        General: Normal range of motion.     Cervical back: Neck supple.  Skin:    General: Skin is warm and dry.  Neurological:      Mental Status: She is alert and oriented to person, place, and time.      UC Treatments / Results  Labs (all labs ordered are listed, but  only abnormal results are displayed) Labs Reviewed  POCT URINALYSIS DIP (MANUAL ENTRY) - Abnormal; Notable for the following components:      Result Value   Clarity, UA cloudy (*)    Blood, UA small (*)    Protein Ur, POC =100 (*)    Leukocytes, UA Moderate (2+) (*)    All other components within normal limits  URINE CULTURE  POCT URINE PREGNANCY  CERVICOVAGINAL ANCILLARY ONLY    EKG   Radiology No results found.  Procedures Procedures (including critical care time)  Medications Ordered in UC Medications - No data to display  Initial Impression / Assessment and Plan / UC Course  I have reviewed the triage vital signs and the nursing notes.  Pertinent labs & imaging results that were available during my care of the patient were reviewed by me and considered in my medical decision making (see chart for details).     UA with moderate leuks, will send for culture to repeat, vaginal swab pending also screen for any vaginal causes of symptoms.  Opting to proceed with treatment for UTI despite prior negative culture given UA results today with Macrobid twice daily x5 days.  Alter therapy as needed.  Discussed following up with OB/GYN if symptoms persisting and results negative.  Discussed strict return precautions. Patient verbalized understanding and is agreeable with plan.  Final Clinical Impressions(s) / UC Diagnoses   Final diagnoses:  Pelvic pressure in female     Discharge Instructions     Macrobid twice daily for 5 days to treat UTI Urine culture and vaginal swab pending for further screening of pelvic discomfort Please follow-up with OB/GYN if continuing to have pressure and results continuing to be negative    ED Prescriptions    Medication Sig Dispense Auth. Provider   nitrofurantoin, macrocrystal-monohydrate,  (MACROBID) 100 MG capsule Take 1 capsule (100 mg total) by mouth 2 (two) times daily for 5 days. 10 capsule Devetta Hagenow, [place] C, PA-C     PDMP not reviewed this encounter.   Sharyon Cable [place] C, PA-C 08/10/20 0740

## 2020-08-09 NOTE — Discharge Instructions (Addendum)
Macrobid twice daily for 5 days to treat UTI Urine culture and vaginal swab pending for further screening of pelvic discomfort Please follow-up with OB/GYN if continuing to have pressure and results continuing to be negative

## 2020-08-11 LAB — URINE CULTURE: Culture: NO GROWTH

## 2020-08-12 LAB — CERVICOVAGINAL ANCILLARY ONLY
Bacterial Vaginitis (gardnerella): POSITIVE — AB
Candida Glabrata: NEGATIVE
Candida Vaginitis: POSITIVE — AB
Chlamydia: NEGATIVE
Comment: NEGATIVE
Comment: NEGATIVE
Comment: NEGATIVE
Comment: NEGATIVE
Comment: NEGATIVE
Comment: NORMAL
Neisseria Gonorrhea: NEGATIVE
Trichomonas: NEGATIVE

## 2020-08-13 ENCOUNTER — Emergency Department (HOSPITAL_COMMUNITY): Payer: Medicaid Other

## 2020-08-13 ENCOUNTER — Emergency Department (HOSPITAL_COMMUNITY)
Admission: EM | Admit: 2020-08-13 | Discharge: 2020-08-13 | Disposition: A | Discharge status: Home / Self Care | Payer: Medicaid Other | Attending: Emergency Medicine | Admitting: Emergency Medicine

## 2020-08-13 ENCOUNTER — Encounter (HOSPITAL_COMMUNITY): Payer: Self-pay

## 2020-08-13 DIAGNOSIS — R1032 Left lower quadrant pain: Secondary | ICD-10-CM | POA: Diagnosis not present

## 2020-08-13 DIAGNOSIS — N888 Other specified noninflammatory disorders of cervix uteri: Secondary | ICD-10-CM | POA: Diagnosis not present

## 2020-08-13 DIAGNOSIS — N3000 Acute cystitis without hematuria: Secondary | ICD-10-CM | POA: Diagnosis not present

## 2020-08-13 DIAGNOSIS — R102 Pelvic and perineal pain: Secondary | ICD-10-CM | POA: Insufficient documentation

## 2020-08-13 DIAGNOSIS — R319 Hematuria, unspecified: Secondary | ICD-10-CM | POA: Diagnosis not present

## 2020-08-13 DIAGNOSIS — D72829 Elevated white blood cell count, unspecified: Secondary | ICD-10-CM | POA: Diagnosis not present

## 2020-08-13 DIAGNOSIS — Z21 Asymptomatic human immunodeficiency virus [HIV] infection status: Secondary | ICD-10-CM | POA: Insufficient documentation

## 2020-08-13 LAB — URINALYSIS, ROUTINE W REFLEX MICROSCOPIC
Bacteria, UA: NONE SEEN
Bilirubin Urine: NEGATIVE
Glucose, UA: NEGATIVE mg/dL
Hgb urine dipstick: NEGATIVE
Ketones, ur: 5 mg/dL — AB
Nitrite: NEGATIVE
Protein, ur: 30 mg/dL — AB
Specific Gravity, Urine: 1.017 (ref 1.005–1.030)
WBC, UA: >50 WBC/hpf — ABNORMAL HIGH (ref 0–5)
pH: 5 (ref 5.0–8.0)

## 2020-08-13 LAB — COMPREHENSIVE METABOLIC PANEL
ALT: 39 U/L (ref 0–44)
AST: 51 U/L — ABNORMAL HIGH (ref 15–41)
Albumin: 4.3 g/dL (ref 3.5–5.0)
Alkaline Phosphatase: 68 U/L (ref 38–126)
Anion gap: 9 (ref 5–15)
BUN: 11 mg/dL (ref 6–20)
CO2: 23 mmol/L (ref 22–32)
Calcium: 9.4 mg/dL (ref 8.9–10.3)
Chloride: 106 mmol/L (ref 98–111)
Creatinine, Ser: 0.66 mg/dL (ref 0.44–1.00)
GFR, Estimated: >60 mL/min (ref >60)
Glucose, Bld: 95 mg/dL (ref 70–99)
Potassium: 3.9 mmol/L (ref 3.5–5.1)
Sodium: 138 mmol/L (ref 135–145)
Total Bilirubin: 0.6 mg/dL (ref 0.3–1.2)
Total Protein: 8.5 g/dL — ABNORMAL HIGH (ref 6.5–8.1)

## 2020-08-13 LAB — CBC
HCT: 33.2 % — ABNORMAL LOW (ref 36.0–46.0)
Hemoglobin: 9.8 g/dL — ABNORMAL LOW (ref 12.0–15.0)
MCH: 20.2 pg — ABNORMAL LOW (ref 26.0–34.0)
MCHC: 29.5 g/dL — ABNORMAL LOW (ref 30.0–36.0)
MCV: 68.6 fL — ABNORMAL LOW (ref 80.0–100.0)
Platelets: 220 10*3/uL (ref 150–400)
RBC: 4.84 MIL/uL (ref 3.87–5.11)
RDW: 20.1 % — ABNORMAL HIGH (ref 11.5–15.5)
WBC: 5.7 10*3/uL (ref 4.0–10.5)
nRBC: 0 % (ref 0.0–0.2)

## 2020-08-13 LAB — I-STAT BETA HCG BLOOD, ED (MC, WL, AP ONLY): I-stat hCG, quantitative: <5 m[IU]/mL (ref <5)

## 2020-08-13 LAB — LIPASE, BLOOD: Lipase: 26 U/L (ref 11–51)

## 2020-08-13 MED ORDER — NAPROXEN 500 MG PO TABS: 500.0000 mg | ORAL_TABLET | Freq: Two times a day (BID) | ORAL | 0 refills | Status: AC | PRN

## 2020-08-13 MED ORDER — KETOROLAC TROMETHAMINE 15 MG/ML IJ SOLN
15.0000 mg | Freq: Once | INTRAMUSCULAR | Status: AC
  Administered 2020-08-13: 15 mg via INTRAVENOUS
  Filled 2020-08-13: qty 1

## 2020-08-13 NOTE — Discharge Instructions (Addendum)
It was wonderful to see you today.  We have sent in naproxen, this is an anti-inflammatory/pain medication to help with your discomfort.  You can take twice daily as needed for pain, recommend taking with food as it can cause some GI upset.  It is also okay to take Tylenol 1000 mg 3-4 times daily.  You can continue using ice/heat on your stomach 20 minutes at a time several times a day.  You may have interstitial cystitis. You can follow-up either with your GYN doctor or Urologist.

## 2020-08-13 NOTE — ED Provider Notes (Signed)
  Physical Exam  BP (!) 143/125 (BP Location: Left Arm)   Pulse 75   Temp 98.4 F (36.9 C) (Oral)   Resp 18   Ht 5\' 3"  (1.6 m)   Wt 79.4 kg   LMP 08/01/2020   SpO2 100%   BMI 31.00 kg/m   Physical Exam  ED Course/Procedures     Procedures  MDM  Care assumed at 4 PM.  Patient is here with persistent abdominal pain.  Ultrasound unremarkable and patient had multiple negative urinalysis.  Signed out pending CT abdomen pelvis  5:46 PM CT showed decompressed bladder with some mild inflammatory changes.  However patient does not have any obvious UTI on urinalysis.  Urine culture was sent.  Previous provider wants to hold off on antibiotics as patient has been on multiple antibiotics and has normal urine cultures.  At this point, I wonder if she has interstitial cystitis.  Will refer to urology for further evaluation.  She may need a cystoscopy at this point.      10/01/2020, MD 08/13/20 [phone]

## 2020-08-13 NOTE — ED Notes (Signed)
Pt gone to ct unable to update vitals at this moment.

## 2020-08-13 NOTE — ED Provider Notes (Signed)
[facility]-EMERGENCY DEPT Provider Note   CSN: [id number] Arrival date & time: 08/13/20  1328     History Chief Complaint  Patient presents with  . Abdominal Pain    Tonya Weeks is a 30 y.o. female, with a history of HIV, syphilis, and sickle cell trait, presenting for evaluation of left sided pelvic pain.   Reports this initially started approximately 1 month ago, associated with urinary urgency and frequency.  She was initially seen at urgent care for this complaint on 5/1, suspected UTI at that time and Rx'd Keflex.  Urine culture returned no growth and she discontinued this course.  She then was seen again for same complaints on 5/13, retreated as possible UTI with Macrobid, however second urine culture also returned as no growth.  She also was tested for GC/CH/trichomoniasis, all of which were negative.  She has had no changes in her vaginal discharge and has not been sexually active since those were obtained.  She just finished antibiotic treatment yesterday.  Today she reports the same pain is present, mainly in her left lower quadrant.  She sought care today as increased in severity earlier this morning and lasted several hours.  Seems to be the worst in the morning but will wear off by the afternoon.  Sharp and contraction-like in nature.  Denies any associated dysuria, fever, chills, nausea, vomiting, diarrhea/constipation.  No known sick contacts.  Reports taking her Tivicay and Descovy as prescribed, however has not followed up with ID since 2019.  LMP 5/5.     Past Medical History:  Diagnosis Date  . Anemia   . History of syphilis    Treated during last pregnancy   . HIV (human immunodeficiency virus infection) (HCC)   . HIV infection (HCC)   . HSV infection   . Sickle cell trait [facility])     Patient Active Problem List   Diagnosis Date Noted  . Pregnancy 07/09/2017  . Encounter for elective induction of labor 07/08/2017  . Nausea & vomiting  07/05/2017  . Pregnancy and infectious disease in third trimester 02/11/2017  . Hypertension in pregnancy 04/15/2015  . Encounter for long-term (current) use of other medications 10/12/2013  . Health care maintenance 10/12/2013  . 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 Depression 09/05/2012  . Syphilis contact, treated 01/07/2012  . HIV disease (HCC) 09/01/2011    Past Surgical History:  Procedure Laterality Date  . DILATION AND CURETTAGE, DIAGNOSTIC / THERAPEUTIC       OB History    Gravida  3   Para  2   Term  2   Preterm      AB  1   Living  2     SAB      IAB  1   Ectopic      Multiple  0   Live Births  2           Family History  Problem Relation Age of Onset  . Lupus Mother   . Hypertension Mother   . Heart failure Mother   . Heart attack Mother   . Sickle cell trait Mother   . Scleroderma Mother   . Hypertension Father   . Arthritis Maternal Grandmother   . Diabetes Maternal Grandmother   . Hypertension Maternal Grandmother   . Stroke Maternal Grandmother   . Heart failure Maternal Grandmother   . Heart attack Maternal Grandmother   . Asthma Maternal Grandmother   . Arthritis Maternal  Grandfather   . Diabetes Maternal Grandfather   . Hypertension Maternal Grandfather   . Stroke Maternal Grandfather   . Heart failure Maternal Grandfather   . Heart attack Maternal Grandfather   . Kidney disease Maternal Grandfather   . Other Neg Hx     Social History   Tobacco Use  . Smoking status: Never Smoker  . Smokeless tobacco: Never Used  Vaping Use  . Vaping Use: Never used  Substance Use Topics  . Alcohol use: Yes    Comment: Socially   . Drug use: No    Home Medications Prior to Admission medications   Medication Sig Start Date End Date Taking? Authorizing Provider  naproxen (NAPROSYN) 500 MG tablet Take 1 tablet (500 mg total) by mouth 2 (two) times daily as needed for up to 10 days for moderate pain. Take with a  meal 08/13/20 08/23/20 Yes Matson Welch N, DO  DESCOVY 200-25 MG tablet Take 1 tablet by mouth daily. 06/14/17   [provider]  dolutegravir (TIVICAY) 50 MG tablet Take 1 tablet (50 mg total) by mouth daily. 04/19/17   Blanchard Kelch, NP  Emtricitabine-Tenofovir DF (TRUVADA PO) Truvada    [provider]  ibuprofen (ADVIL,MOTRIN) 600 MG tablet Take 1 tablet (600 mg total) by mouth every 6 (six) hours. 07/10/17   Levi Aland, MD  nitrofurantoin, macrocrystal-monohydrate, (MACROBID) 100 MG capsule Take 1 capsule (100 mg total) by mouth 2 (two) times daily for 5 days. 08/09/20 08/14/20  Wieters, Hallie C, PA-C  ondansetron (ZOFRAN-ODT) 4 MG disintegrating tablet Take 1 tablet (4 mg total) by mouth every 8 (eight) hours as needed for nausea or vomiting. 04/08/18   Benjiman Core, MD  Prenatal Vit-Fe Fumarate-FA (PRENATAL MULTIVITAMIN) TABS tablet Take 1 tablet by mouth daily at 12 noon.    [provider]    Allergies    Patient has no known allergies.  Review of Systems   Review of Systems  Constitutional: Negative for activity change, appetite change, fatigue and fever.  HENT: Negative for congestion and sore throat.   Respiratory: Negative for shortness of breath.   Cardiovascular: Negative for chest pain.  Gastrointestinal: Positive for abdominal pain. Negative for abdominal distention, anal bleeding, blood in stool, constipation, diarrhea, nausea, rectal pain and vomiting.  Genitourinary: Positive for frequency, pelvic pain and urgency. Negative for decreased urine volume, difficulty urinating, dysuria, hematuria, vaginal bleeding, vaginal discharge and vaginal pain.  Musculoskeletal: Negative for back pain.  Skin: Negative for rash.    Physical Exam Updated Vital Signs BP (!) 145/100 (BP Location: Left Arm)   Pulse 76   Temp 98.4 F (36.9 C) (Oral)   Resp 18   Ht 5\' 3"  (1.6 m)   Wt 79.4 kg   LMP 08/01/2020   SpO2 100%   BMI 31.00 kg/m    Physical Exam Constitutional:      Appearance: She is well-developed. She is not ill-appearing or toxic-appearing.  HENT:     Head: Normocephalic and atraumatic.     Mouth/Throat:     Mouth: Mucous membranes are moist.  Cardiovascular:     Rate and Rhythm: Normal rate.     Heart sounds: Normal heart sounds.  Pulmonary:     Effort: Pulmonary effort is normal.     Breath sounds: Normal breath sounds.  Abdominal:     Hernia: No hernia is present.     Comments: Soft, nondistended.  Normoactive bowel sounds throughout.  Tenderness to palpation of the left  lower quadrant and suprapubic region without any rebounding or guarding.  Negative Rovsing's sign.  No CVA tenderness bilaterally.  Skin:    Capillary Refill: Capillary refill takes less than 2 seconds.  Neurological:     Mental Status: She is alert and oriented to person, place, and time.     ED Results / Procedures / Treatments   Labs (all labs ordered are listed, but only abnormal results are displayed) Labs Reviewed  COMPREHENSIVE METABOLIC PANEL - Abnormal; Notable for the following components:      Result Value   Total Protein 8.5 (*)    AST 51 (*)    All other components within normal limits  CBC - Abnormal; Notable for the following components:   Hemoglobin 9.8 (*)    HCT 33.2 (*)    MCV 68.6 (*)    MCH 20.2 (*)    MCHC 29.5 (*)    RDW 20.1 (*)    All other components within normal limits  URINALYSIS, ROUTINE W REFLEX MICROSCOPIC - Abnormal; Notable for the following components:   APPearance HAZY (*)    Ketones, ur 5 (*)    Protein, ur 30 (*)    Leukocytes,Ua LARGE (*)    WBC, UA >50 (*)    All other components within normal limits  LIPASE, BLOOD  I-STAT BETA HCG BLOOD, ED ([facility], [facility], [facility] ONLY)    EKG None  Radiology US Pelvis Complete  Result Date: 08/13/2020 CLINICAL DATA:  Left-sided pelvic pain, initial encounter EXAM: TRANSABDOMINAL ULTRASOUND OF PELVIS DOPPLER ULTRASOUND OF OVARIES TECHNIQUE:  Transabdominal ultrasound examination of the pelvis was performed including evaluation of the uterus, ovaries, adnexal regions, and pelvic cul-de-sac. Color and duplex Doppler ultrasound was utilized to evaluate blood flow to the ovaries. Transvaginal imaging was deferred by the patient. COMPARISON:  04/25/2012 FINDINGS: Uterus Measurements: 9.2 x 5.2 x 5.9 cm. = volume: 146 mL. No fibroids or other mass visualized. Endometrium Thickness: 10 mm.  No focal abnormality visualized. Right ovary Measurements: 1.9 x 1.4 x 1.7 cm. = volume: 2.3 mL. Follicular changes are noted. Left ovary Measurements: 2.6 x 2.3 x 2.5 cm. = volume: 7.5 mL. Follicular changes are noted. Pulsed Doppler evaluation demonstrates normal low-resistance arterial and venous waveforms in both ovaries. Other: None IMPRESSION: No evidence of ovarian torsion. No acute abnormality noted. Electronically Signed   By: Alcide Clever M.D.   On: 08/13/2020 16:10   [place] Art/Ven Flow Abd Pelv Doppler  Result Date: 08/13/2020 CLINICAL DATA:  Left-sided pelvic pain, initial encounter EXAM: TRANSABDOMINAL ULTRASOUND OF PELVIS DOPPLER ULTRASOUND OF OVARIES TECHNIQUE: Transabdominal ultrasound examination of the pelvis was performed including evaluation of the uterus, ovaries, adnexal regions, and pelvic cul-de-sac. Color and duplex Doppler ultrasound was utilized to evaluate blood flow to the ovaries. Transvaginal imaging was deferred by the patient. COMPARISON:  04/25/2012 FINDINGS: Uterus Measurements: 9.2 x 5.2 x 5.9 cm. = volume: 146 mL. No fibroids or other mass visualized. Endometrium Thickness: 10 mm.  No focal abnormality visualized. Right ovary Measurements: 1.9 x 1.4 x 1.7 cm. = volume: 2.3 mL. Follicular changes are noted. Left ovary Measurements: 2.6 x 2.3 x 2.5 cm. = volume: 7.5 mL. Follicular changes are noted. Pulsed Doppler evaluation demonstrates normal low-resistance arterial and venous waveforms in both ovaries. Other: None IMPRESSION: No  evidence of ovarian torsion. No acute abnormality noted. Electronically Signed   By: Alcide Clever M.D.   On: 08/13/2020 16:10    Procedures Procedures   Medications Ordered in ED Medications  ketorolac (TORADOL) 15 MG/ML injection 15 mg (has no administration in time range)    ED Course  I have reviewed the triage vital signs and the nursing notes.  Pertinent labs & imaging results that were available during my care of the patient were reviewed by me and considered in my medical decision making (see chart for details).    MDM Rules/Calculators/A&P                          30 year old female, with a history of HIV on Descovy and Tivicay, presenting for evaluation of recurrent left lower quadrant abdominal pain for the past month. This is her third evaluation in the ED for the same concern, previously treated as UTI, however both urine cultures resulted with no growth and no improvement following antibiotic course.  Afebrile and hemodynamically stable with mild tenderness in her LLQ/suprapubic region.  Recent GC/CH/trichomoniasis negative on 5/13, doubt PID.  Will obtain pelvic U/S to rule out cyst/rupture, in addition to CBC, CMP, U/A.  Beta-hCG <5.   U/S without evidence of ovarian cyst, torsion, or uterine abnormality.  No associated leukocytosis or AKI.  Slight elevation of AST with remainder of hepatic function WNL, doubt contributing to current presentation.  U/a with large leukocytes and hematuria, will send for culture but await results prior to treatment.  On reassessment, noted to have recurrent left-sided pain, initially appeared comfortable.  Given IV Toradol and will obtain a CT abdomen/pelvis to assess for intra-abdominal pathology/obstructing renal stone.  Signed out to Dr. Silverio LayYao at [phone], pending CT results, plan on discharge home with naproxen as needed and follow-up with PCP.   Final Clinical Impression(s) / ED Diagnoses Final diagnoses:  Left lower quadrant abdominal pain     Rx / DC Orders ED Discharge Orders         Ordered    naproxen (NAPROSYN) 500 MG tablet  2 times daily PRN        08/13/20 1638           Allayne StackBeard, Darl Brisbin N, DO 08/13/20 1701    Milagros Lollykstra, Richard S, MD 08/14/20 2125

## 2020-08-13 NOTE — ED Triage Notes (Signed)
Patient reports left lower abdominal pain that radiates to the right sided. 10/10 pain level.   Patient reports she was seen at [facility] for frequent urination and given antibiotic that she started Saturday. Patient has not had any relief.   Denies burning, hematuria, discharge.  Denies n/v    A/ox4

## 2020-08-14 ENCOUNTER — Telehealth: Payer: Self-pay | Admitting: *Deleted

## 2020-08-14 NOTE — Telephone Encounter (Signed)
Transition Care Management Follow-up Telephone Call  Date of discharge and from where: 5/17/20222 - [facility]  How have you been since you were released from the hospital? "Still in a lot of pain"  Any questions or concerns? No  Items Reviewed:  Did the pt receive and understand the discharge instructions provided? Yes   Medications obtained and verified? Yes   Other? No   Any new allergies since your discharge? No   Dietary orders reviewed? No  Do you have support at home? Yes    Functional Questionnaire: (I = Independent and D = Dependent) ADLs: I  Bathing/Dressing- I  Meal Prep- I  Eating- I  Maintaining continence- I  Transferring/Ambulation- I  Managing Meds- I  Follow up appointments reviewed:   PCP Hospital f/u appt confirmed? No    Specialist Hospital f/u appt confirmed? No  - was referred to Urology by ED staff  Are transportation arrangements needed? N/A  If their condition worsens, is the pt aware to call PCP or go to the Emergency Dept.? Yes  Was the patient provided with contact information for the PCP's office or ED? Yes  Was to pt encouraged to call back with questions or concerns? Yes

## 2020-08-15 ENCOUNTER — Telehealth (HOSPITAL_COMMUNITY): Payer: Self-pay | Admitting: Emergency Medicine

## 2020-08-15 LAB — URINE CULTURE: Culture: NO GROWTH

## 2020-08-15 MED ORDER — FLUCONAZOLE 150 MG PO TABS: 150.0000 mg | ORAL_TABLET | Freq: Once | ORAL | 0 refills | Status: AC

## 2020-08-15 MED ORDER — METRONIDAZOLE 500 MG PO TABS: 500.0000 mg | ORAL_TABLET | Freq: Two times a day (BID) | ORAL | 0 refills | Status: DC

## 2020-08-20 DIAGNOSIS — N3090 Cystitis, unspecified without hematuria: Secondary | ICD-10-CM | POA: Insufficient documentation

## 2020-08-20 DIAGNOSIS — R109 Unspecified abdominal pain: Secondary | ICD-10-CM | POA: Insufficient documentation

## 2020-08-27 DIAGNOSIS — R102 Pelvic and perineal pain: Secondary | ICD-10-CM | POA: Diagnosis not present

## 2020-08-27 DIAGNOSIS — Z87440 Personal history of urinary (tract) infections: Secondary | ICD-10-CM | POA: Diagnosis not present

## 2020-08-30 DIAGNOSIS — M62838 Other muscle spasm: Secondary | ICD-10-CM | POA: Diagnosis not present

## 2020-08-30 DIAGNOSIS — M6281 Muscle weakness (generalized): Secondary | ICD-10-CM | POA: Diagnosis not present

## 2020-08-30 DIAGNOSIS — M5386 Other specified dorsopathies, lumbar region: Secondary | ICD-10-CM | POA: Diagnosis not present

## 2020-08-30 DIAGNOSIS — R102 Pelvic and perineal pain: Secondary | ICD-10-CM | POA: Diagnosis not present

## 2020-09-05 ENCOUNTER — Other Ambulatory Visit: Payer: Self-pay

## 2020-09-05 ENCOUNTER — Emergency Department (HOSPITAL_COMMUNITY)
Admission: EM | Admit: 2020-09-05 | Discharge: 2020-09-06 | Disposition: A | Discharge status: Home / Self Care | Payer: Medicaid Other | Attending: Emergency Medicine | Admitting: Emergency Medicine

## 2020-09-05 ENCOUNTER — Encounter (HOSPITAL_COMMUNITY): Payer: Self-pay

## 2020-09-05 DIAGNOSIS — R112 Nausea with vomiting, unspecified: Secondary | ICD-10-CM | POA: Insufficient documentation

## 2020-09-05 DIAGNOSIS — R102 Pelvic and perineal pain: Secondary | ICD-10-CM | POA: Diagnosis not present

## 2020-09-05 DIAGNOSIS — N3000 Acute cystitis without hematuria: Secondary | ICD-10-CM | POA: Diagnosis not present

## 2020-09-05 DIAGNOSIS — Z21 Asymptomatic human immunodeficiency virus [HIV] infection status: Secondary | ICD-10-CM | POA: Diagnosis not present

## 2020-09-05 DIAGNOSIS — M6289 Other specified disorders of muscle: Secondary | ICD-10-CM

## 2020-09-05 LAB — CBC WITH DIFFERENTIAL/PLATELET
Abs Immature Granulocytes: 0.02 10*3/uL (ref 0.00–0.07)
Basophils Absolute: 0 10*3/uL (ref 0.0–0.1)
Basophils Relative: 0 %
Eosinophils Absolute: 0.1 10*3/uL (ref 0.0–0.5)
Eosinophils Relative: 2 %
HCT: 33.3 % — ABNORMAL LOW (ref 36.0–46.0)
Hemoglobin: 9.8 g/dL — ABNORMAL LOW (ref 12.0–15.0)
Immature Granulocytes: 0 %
Lymphocytes Relative: 68 %
Lymphs Abs: 4.5 10*3/uL — ABNORMAL HIGH (ref 0.7–4.0)
MCH: 20.1 pg — ABNORMAL LOW (ref 26.0–34.0)
MCHC: 29.4 g/dL — ABNORMAL LOW (ref 30.0–36.0)
MCV: 68.4 fL — ABNORMAL LOW (ref 80.0–100.0)
Monocytes Absolute: 0.3 10*3/uL (ref 0.1–1.0)
Monocytes Relative: 5 %
Neutro Abs: 1.7 10*3/uL (ref 1.7–7.7)
Neutrophils Relative %: 25 %
Platelets: 223 10*3/uL (ref 150–400)
RBC: 4.87 MIL/uL (ref 3.87–5.11)
RDW: 20 % — ABNORMAL HIGH (ref 11.5–15.5)
WBC: 6.7 10*3/uL (ref 4.0–10.5)
nRBC: 0 % (ref 0.0–0.2)

## 2020-09-05 LAB — URINALYSIS, ROUTINE W REFLEX MICROSCOPIC
Bacteria, UA: NONE SEEN
Bilirubin Urine: NEGATIVE
Glucose, UA: NEGATIVE mg/dL
Ketones, ur: NEGATIVE mg/dL
Nitrite: NEGATIVE
Protein, ur: 100 mg/dL — AB
Specific Gravity, Urine: 1.016 (ref 1.005–1.030)
WBC, UA: >50 WBC/hpf — ABNORMAL HIGH (ref 0–5)
pH: 6 (ref 5.0–8.0)

## 2020-09-05 LAB — BASIC METABOLIC PANEL
Anion gap: 7 (ref 5–15)
BUN: 11 mg/dL (ref 6–20)
CO2: 23 mmol/L (ref 22–32)
Calcium: 9.4 mg/dL (ref 8.9–10.3)
Chloride: 109 mmol/L (ref 98–111)
Creatinine, Ser: 1.02 mg/dL — ABNORMAL HIGH (ref 0.44–1.00)
GFR, Estimated: >60 mL/min (ref >60)
Glucose, Bld: 90 mg/dL (ref 70–99)
Potassium: 4.3 mmol/L (ref 3.5–5.1)
Sodium: 139 mmol/L (ref 135–145)

## 2020-09-05 LAB — I-STAT BETA HCG BLOOD, ED (MC, WL, AP ONLY): I-stat hCG, quantitative: <5 m[IU]/mL (ref <5)

## 2020-09-05 MED ORDER — CEPHALEXIN 500 MG PO CAPS: 500.0000 mg | ORAL_CAPSULE | Freq: Two times a day (BID) | ORAL | 0 refills | Status: AC

## 2020-09-05 MED ORDER — MORPHINE SULFATE (PF) 4 MG/ML IV SOLN
4.0000 mg | Freq: Once | INTRAVENOUS | Status: AC
Start: 2020-09-05 — End: 2020-09-05
  Administered 2020-09-05: 4 mg via INTRAVENOUS
  Filled 2020-09-05: qty 1

## 2020-09-05 MED ORDER — PHENAZOPYRIDINE HCL 200 MG PO TABS
200.0000 mg | ORAL_TABLET | Freq: Once | ORAL | Status: AC
  Administered 2020-09-05: 200 mg via ORAL
  Filled 2020-09-05: qty 1

## 2020-09-05 MED ORDER — SODIUM CHLORIDE 0.9 % IV BOLUS
1000.0000 mL | Freq: Once | INTRAVENOUS | Status: AC
  Administered 2020-09-05: 1000 mL via INTRAVENOUS

## 2020-09-05 MED ORDER — PHENAZOPYRIDINE HCL 200 MG PO TABS: 200.0000 mg | ORAL_TABLET | Freq: Three times a day (TID) | ORAL | 0 refills | Status: DC

## 2020-09-05 MED ORDER — SODIUM CHLORIDE 0.9 % IV BOLUS
500.0000 mL | Freq: Once | INTRAVENOUS | Status: AC
  Administered 2020-09-05: 500 mL via INTRAVENOUS

## 2020-09-05 MED ORDER — ONDANSETRON HCL 4 MG/2ML IJ SOLN
4.0000 mg | Freq: Once | INTRAMUSCULAR | Status: AC
  Administered 2020-09-05: 4 mg via INTRAVENOUS
  Filled 2020-09-05: qty 2

## 2020-09-05 MED ORDER — HYDROMORPHONE HCL 1 MG/ML IJ SOLN
0.5000 mg | Freq: Once | INTRAMUSCULAR | Status: AC
  Administered 2020-09-05: 0.5 mg via INTRAVENOUS
  Filled 2020-09-05: qty 1

## 2020-09-05 MED ORDER — SODIUM CHLORIDE 0.9 % IV SOLN
1.0000 g | Freq: Once | INTRAVENOUS | Status: AC
  Administered 2020-09-05: 1 g via INTRAVENOUS
  Filled 2020-09-05: qty 10

## 2020-09-05 MED ORDER — HYDROCODONE-ACETAMINOPHEN 5-325 MG PO TABS: 1.0000 | ORAL_TABLET | Freq: Four times a day (QID) | ORAL | 0 refills | Status: DC | PRN

## 2020-09-05 NOTE — ED Triage Notes (Signed)
Patient c/o pelvic floor dysfunction. Patient reports she is unable to walk due to the pain.   Patient describes the pain as contractions in her pelvic region. 10/10 pain.   Patient in wheelchair during wheelchair.    She was referred to urology and was diagnosed with pelvic floor dysfunction.   A/Ox4  Patient states she has no relief with OTC medications or prescribed medication from urology.

## 2020-09-05 NOTE — ED Provider Notes (Signed)
Emergency Medicine Provider Triage Evaluation Note  Tonya Weeks , a 30 y.o. female  was evaluated in triage.  Pt complains of pelvic pain that is chronic but worsened. Denies vaginal bleeding.   Review of Systems  Positive: Pelvic pain Negative: Fevers, nv, hematuria  Physical Exam  BP (!) 173/126   Pulse (!) 112   Temp 97.6 F (36.4 C) (Oral)   Resp 18   LMP 08/29/2020   SpO2 100%  Gen:   Awake, no distress   Resp:  Normal effort  MSK:   Moves extremities without difficulty   Medical Decision Making  Medically screening exam initiated at 12:09 PM.  Appropriate orders placed.  Tonya Weeks was informed that the remainder of the evaluation will be completed by another provider, this initial triage assessment does not replace that evaluation, and the importance of remaining in the ED until their evaluation is complete.    Tonya Meres, PA-C 09/05/20 1211    Tonya Mulders, MD 09/11/20 1615

## 2020-09-05 NOTE — ED Notes (Signed)
Pt notes she is in a lot of pain at this time. Pt requests pain medicine before her pelvic exam.

## 2020-09-05 NOTE — ED Notes (Signed)
Bladder scan shows volume of 55 mL.

## 2020-09-05 NOTE — ED Notes (Signed)
ED Provider at bedside. 

## 2020-09-05 NOTE — Discharge Instructions (Addendum)
Prescription sent to your pharmacy for an antibiotic Keflex for UTI as well as Pyridium.  Take these medications as prescribed.  Take Norco as prescribed for severe pain only.  Do not take this medicine if you are going to be working or driving as it can make you drowsy.  Avoid taking this when you are taking muscle relaxers.  Follow closely with urology and OB/GYN.  Please let your urologist know if the muscle relaxers are not significantly improving your symptoms as they mentioned possibly needing to do an MRI if there was no improvement.  Your blood pressure was elevated today.  You need to follow-up with a primary care doctor or OB/GYN to have this monitored and possibly started on medications.  Return to the emergency department if you have recurrent episodes where you are peeing and pooping on yourself, if you pass out, or have other new, concerning symptoms.

## 2020-09-05 NOTE — ED Provider Notes (Signed)
[facility]-EMERGENCY DEPT Provider Note   CSN: [id number] Arrival date & time: 09/05/20  1040     History Chief Complaint  Patient presents with   Pelvic Pain    Tonya Weeks is a 30 y.o. female with past medical history significant for anemia, HIV, HSV, sickle cell trait.  Chart review shows she has not seen infectious disease since 2019.  Patient is presenting to emergency room today with chief complaint of pelvic pain has been going on x1-1/2 months.  Patient states she has been seen in urgent care, in the ED and [facility] urology without any symptom improvement.  Patient describes her pelvic pain as sharp and burning internally.  She feels like she is having a contraction when she has to use the bathroom.  She states anytime she needs to urinate it will bring tears to her eyes because it is so painful.  She rates the pain currently 10 out of 10 in severity.  She states her pain is constant.  Pain is worse with ambulation.  She recently saw urology 08/27/2020 and it was thought she had pelvic floor dysfunction so she was prescribed Valium to help with muscle spasms and pelvic floor therapy.  Patient states she has been to 1 therapy session so far.  She states her pain is so severe that she has difficulty walking.  She states the Valium helped the first day however did not help after that.  She states she has not had any fever, vaginal discharge, back pain, gross hematuria.  She has not been sexually active in over a month because of the severe pain.  She states occasionally the pain will be so severe it makes her nauseous, denies any emesis.  She has been taking Tylenol and ibuprofen at home as well as using heating pad without any symptom improvement.  Past Medical History:  Diagnosis Date   Anemia    History of syphilis    Treated during last pregnancy    HIV (human immunodeficiency virus infection) (HCC)    HIV infection (HCC)    HSV infection    Sickle cell  trait (HCC)     Patient Active Problem List   Diagnosis Date Noted   Pregnancy 07/09/2017   Encounter for elective induction of labor 07/08/2017   Nausea & vomiting 07/05/2017   Pregnancy and infectious disease in third trimester 02/11/2017   Hypertension in pregnancy 04/15/2015   Encounter for long-term (current) use of other medications 10/12/2013   Health care maintenance 10/12/2013   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   Depression 09/05/2012   Syphilis contact, treated 01/07/2012   HIV disease (HCC) 09/01/2011    Past Surgical History:  Procedure Laterality Date   DILATION AND CURETTAGE, DIAGNOSTIC / THERAPEUTIC       OB History     Gravida  3   Para  2   Term  2   Preterm      AB  1   Living  2      SAB      IAB  1   Ectopic      Multiple  0   Live Births  2           Family History  Problem Relation Age of Onset   Lupus Mother    Hypertension Mother    Heart failure Mother    Heart attack Mother    Sickle cell trait Mother    Scleroderma Mother  Hypertension Father    Arthritis Maternal Grandmother    Diabetes Maternal Grandmother    Hypertension Maternal Grandmother    Stroke Maternal Grandmother    Heart failure Maternal Grandmother    Heart attack Maternal Grandmother    Asthma Maternal Grandmother    Arthritis Maternal Grandfather    Diabetes Maternal Grandfather    Hypertension Maternal Grandfather    Stroke Maternal Grandfather    Heart failure Maternal Grandfather    Heart attack Maternal Grandfather    Kidney disease Maternal Grandfather    Other Neg Hx     Social History   Tobacco Use   Smoking status: Never   Smokeless tobacco: Never  Vaping Use   Vaping Use: Never used  Substance Use Topics   Alcohol use: Yes    Comment: Socially    Drug use: No    Home Medications Prior to Admission medications   Medication Sig Start Date End Date Taking? Authorizing Provider  acetaminophen (TYLENOL)  500 MG tablet Take 1,000 mg by mouth every 6 (six) hours as needed for moderate pain.   Yes [provider]  cephALEXin (KEFLEX) 500 MG capsule Take 1 capsule (500 mg total) by mouth 2 (two) times daily for 14 days. 09/06/20 09/20/20 Yes Walisiewicz, Terris Bodin E, PA-C  DESCOVY 200-25 MG tablet Take 1 tablet by mouth daily. 06/14/17  Yes [provider]  diazepam (VALIUM) 10 MG tablet Take 10 mg by mouth every 12 (twelve) hours as needed for anxiety. 08/27/20  Yes [provider]  Emtricitabine-Tenofovir DF (TRUVADA PO) Take 1 tablet by mouth daily.   Yes [provider]  HYDROcodone-acetaminophen (NORCO/VICODIN) 5-325 MG tablet Take 1 tablet by mouth every 6 (six) hours as needed for severe pain. 09/05/20  Yes Walisiewicz, Michaelanthony Kempton E, PA-C  naproxen sodium (ALEVE) 220 MG tablet Take 880 mg by mouth daily as needed (pain).   Yes [provider]  phenazopyridine (PYRIDIUM) 200 MG tablet Take 1 tablet (200 mg total) by mouth 3 (three) times daily. 09/06/20  Yes Walisiewicz, Huxley Shurley E, PA-C  dolutegravir (TIVICAY) 50 MG tablet Take 1 tablet (50 mg total) by mouth daily. Patient not taking: Reported on 09/05/2020 04/19/17   Blanchard Kelch, NP  ibuprofen (ADVIL,MOTRIN) 600 MG tablet Take 1 tablet (600 mg total) by mouth every 6 (six) hours. Patient not taking: No sig reported 07/10/17   Levi Aland, MD  metroNIDAZOLE (FLAGYL) 500 MG tablet Take 1 tablet (500 mg total) by mouth 2 (two) times daily. Patient not taking: No sig reported 08/15/20   Merrilee Jansky, MD  ondansetron (ZOFRAN-ODT) 4 MG disintegrating tablet Take 1 tablet (4 mg total) by mouth every 8 (eight) hours as needed for nausea or vomiting. Patient not taking: No sig reported 04/08/18   Benjiman Core, MD  Prenatal Vit-Fe Fumarate-FA (PRENATAL MULTIVITAMIN) TABS tablet Take 1 tablet by mouth daily at 12 noon. Patient not taking: Reported on 09/05/2020    [provider]    Allergies     Patient has no known allergies.  Review of Systems   Review of Systems  Genitourinary:  Positive for decreased urine volume, difficulty urinating, pelvic pain and vaginal pain. Negative for flank pain and genital sores.  All other systems reviewed and are negative.  Physical Exam Updated Vital Signs BP (!) 145/105   Pulse 88   Temp (!) 97.4 F (36.3 C) (Oral)   Resp 17   Ht 5\' 3"  (1.6 m)   Wt 80 kg  LMP 08/29/2020   SpO2 100%   BMI 31.24 kg/m   Physical Exam Vitals and nursing note reviewed.  Constitutional:      General: She is not in acute distress.    Appearance: She is not ill-appearing.  HENT:     Head: Normocephalic and atraumatic.     Right Ear: Tympanic membrane and external ear normal.     Left Ear: Tympanic membrane and external ear normal.     Nose: Nose normal.     Mouth/Throat:     Mouth: Mucous membranes are moist.     Pharynx: Oropharynx is clear.  Eyes:     General: No scleral icterus.       Right eye: No discharge.        Left eye: No discharge.     Extraocular Movements: Extraocular movements intact.     Conjunctiva/sclera: Conjunctivae normal.     Pupils: Pupils are equal, round, and reactive to light.  Neck:     Vascular: No JVD.  Cardiovascular:     Rate and Rhythm: Normal rate and regular rhythm.     Pulses: Normal pulses.          Radial pulses are 2+ on the right side and 2+ on the left side.     Heart sounds: Normal heart sounds.  Pulmonary:     Comments: Lungs clear to auscultation in all fields. Symmetric chest rise. No wheezing, rales, or rhonchi. Abdominal:     Comments: Abdomen is soft, non-distended, mild left lower quadrant tenderness to palpation.  No rigidity, no guarding. No peritoneal signs.  No CVA tenderness.  Genitourinary:    Comments:  No external abnormal findings on external vaginal exam, no rash, lesions or swelling.  Patient defers internal exam. Musculoskeletal:        General: Normal range of motion.      Cervical back: Normal range of motion.  Skin:    General: Skin is warm and dry.     Capillary Refill: Capillary refill takes less than 2 seconds.  Neurological:     Mental Status: She is oriented to person, place, and time.     GCS: GCS eye subscore is 4. GCS verbal subscore is 5. GCS motor subscore is 6.     Comments: Fluent speech, no facial droop.  Psychiatric:        Behavior: Behavior normal.    ED Results / Procedures / Treatments   Labs (all labs ordered are listed, but only abnormal results are displayed) Labs Reviewed  CBC WITH DIFFERENTIAL/PLATELET - Abnormal; Notable for the following components:      Result Value   Hemoglobin 9.8 (*)    HCT 33.3 (*)    MCV 68.4 (*)    MCH 20.1 (*)    MCHC 29.4 (*)    RDW 20.0 (*)    Lymphs Abs 4.5 (*)    All other components within normal limits  BASIC METABOLIC PANEL - Abnormal; Notable for the following components:   Creatinine, Ser 1.02 (*)    All other components within normal limits  URINALYSIS, ROUTINE W REFLEX MICROSCOPIC - Abnormal; Notable for the following components:   APPearance CLOUDY (*)    Hgb urine dipstick SMALL (*)    Protein, ur 100 (*)    Leukocytes,Ua LARGE (*)    WBC, UA >50 (*)    All other components within normal limits  URINE CULTURE  I-STAT BETA HCG BLOOD, ED ([facility], [facility], [facility] ONLY)  EKG None  Radiology No results found.  Procedures Procedures   Medications Ordered in ED Medications  sodium chloride 0.9 % bolus 1,000 mL (0 mLs Intravenous Stopped 09/05/20 2253)  morphine 4 MG/ML injection 4 mg (4 mg Intravenous Given 09/05/20 1727)  ondansetron (ZOFRAN) injection 4 mg (4 mg Intravenous Given 09/05/20 1727)  cefTRIAXone (ROCEPHIN) 1 g in sodium chloride 0.9 % 100 mL IVPB (0 g Intravenous Stopped 09/05/20 2253)  phenazopyridine (PYRIDIUM) tablet 200 mg (200 mg Oral Given 09/05/20 2057)  HYDROmorphone (DILAUDID) injection 0.5 mg (0.5 mg Intravenous Given 09/05/20 2045)  ondansetron (ZOFRAN) injection 4 mg  (4 mg Intravenous Given 09/05/20 2258)  sodium chloride 0.9 % bolus 500 mL (500 mLs Intravenous New Bag/Given (Non-Interop) 09/05/20 2359)    ED Course  I have reviewed the triage vital signs and the nursing notes.  Pertinent labs & imaging results that were available during my care of the patient were reviewed by me and considered in my medical decision making (see chart for details).    MDM Rules/Calculators/A&P                          History provided by patient with additional history obtained from chart review.     Presenting with pelvic pain.  She has been seen in multiple settings including urgent care, ED and urology for the same.  She is afebrile, she is hypertensive. Chart review shows since 2020 patient has had documented hypertension in the ED.  As high as 183 systolic and a diastolic ranging from 102-133.  Patient is nontoxic-appearing.  On exam patient has tenderness to left lower quadrant without peritoneal signs.  No CVA tenderness. Post void residual is 55 ml.  Work up was initiated in triage and shows no leukocytosis, hemoglobin consistent with baseline.  BMP shows creatinine at 1.02, this is almost doubled from x3 weeks ago and it was 0.66.  Liter of normal saline given.  Pregnancy test is negative.  UA is suggestive of infection with large leukocytes, over 50 WBC, no bacteria seen.  Patient also has small hemoglobinuria and 21-50 RBCs.  Urine culture sent.  Patient given dose of Rocephin here and Pyridium.  Patient given pain control here in the ED.  Had lengthy discussion with patient and her mother about interstitial cystitis likely being the cause of her symptoms.  Patient will need outpatient management of this by urology and pelvic floor therapy.  She is already plugged in with a urologist.  Patient has been given pain medicine here.  Will discharge home with prescription for antibiotic and Pyridium. Patient will be discharged with a short course of Norco for severe pain only.   Patient knows that narcotics are not the ongoing treatment for interstitial cystitis or pelvic floor dysfunction if that is truly what she has.  She is only being prescribed them today to help her get through the severe pain until she is able to follow-up with urology.  She does have an appointment scheduled with urology in 2 weeks.  Patient will also need to have her creatinine rechecked and discussed the importance of increasing fluid intake to stay well-hydrated.  Engage in shared decision-making with patient and she agrees with plan to hold off on any imaging today as there has been no change in her symptoms and there is low suspicion for infection or acute surgical abdomen.  Visual exam was performed of external genitalia with t and there are no abnormal  findings, no rash or lesions, no visible external swelling.  Patient defers pelvic exam as she has had multiple in the past and she is not currently having any pelvic pain or vaginal discharge as well as has not been sexually active in quite some time because of the pain. Findings and plan of care discussed with supervising physician Dr.  Madilyn Hookees who agrees with plan of care.   Reassessed patient after pain medication. She admits to extreme nausea and spitting up. She was given a dose a zofran and reassessed without improvement in symptoms. Patient does not typically take narcotics, so suspect with decreased PO intake today and she being opioid naive as the cause of emesis after dilaudid. Patient given additional 500 ml NS to hydrate. She will need to be reassessed after fluids. I had planned on discharging patients and therefore had already prescribed keflex, pyridium and short course of norco (6 pills) to her pharmacy. I have reviewed the PDMP during this encounter. Patient without current narcotic prescriptions.   [facility] pending reassessment for PO challenge at the end of my shift. Patient presentation, ED course,  and plan of care discussed with review of all pertinent labs and imaging. Please see her note for further details regarding further ED course and disposition.   Portions of this note were generated with Scientist, clinical (histocompatibility and immunogenetics)Dragon dictation software. Dictation errors may occur despite best attempts at proofreading.  Final Clinical Impression(s) / ED Diagnoses Final diagnoses:  Acute cystitis without hematuria    Rx / DC Orders ED Discharge Orders          Ordered    cephALEXin (KEFLEX) 500 MG capsule  2 times daily        09/05/20 2016    phenazopyridine (PYRIDIUM) 200 MG tablet  3 times daily        09/05/20 2016    HYDROcodone-acetaminophen (NORCO/VICODIN) 5-325 MG tablet  Every 6 hours PRN        09/05/20 2018             Shanon AceWalisiewicz, Babbie Dondlinger E, PA-C 09/06/20 0023    Tilden Fossaees, Elizabeth, MD 09/06/20 0030

## 2020-09-06 NOTE — ED Notes (Signed)
Pt able to tolerate ice chips at this time. PA Mia made aware.

## 2020-09-06 NOTE — ED Provider Notes (Signed)
29 year old female received at sign out from [name] pending PO challenge. Per her HPI:   "Tonya Weeks is a 30 y.o. female with past medical history significant for anemia, HIV, HSV, sickle cell trait.  Chart review shows she has not seen infectious disease since 2019.   Patient is presenting to emergency room today with chief complaint of pelvic pain has been going on x1-1/2 months.  Patient states she has been seen in urgent care, in the ED and [facility] urology without any symptom improvement.   Patient describes her pelvic pain as sharp and burning internally.  She feels like she is having a contraction when she has to use the bathroom.  She states anytime she needs to urinate it will bring tears to her eyes because it is so painful.  She rates the pain currently 10 out of 10 in severity.  She states her pain is constant.  Pain is worse with ambulation.  She recently saw urology 08/27/2020 and it was thought she had pelvic floor dysfunction so she was prescribed Valium to help with muscle spasms and pelvic floor therapy.  Patient states she has been to 1 therapy session so far.  She states her pain is so severe that she has difficulty walking.  She states the Valium helped the first day however did not help after that.   She states she has not had any fever, vaginal discharge, back pain, gross hematuria.  She has not been sexually active in over a month because of the severe pain.  She states occasionally the pain will be so severe it makes her nauseous, denies any emesis.  She has been taking Tylenol and ibuprofen at home as well as using heating pad without any symptom improvement."   Physical Exam  BP (!) 161/115   Pulse 81   Temp (!) 97.4 F (36.3 C) (Oral)   Resp 18   Ht 5\' 3"  (1.6 m)   Wt 80 kg   LMP 08/29/2020   SpO2 100%   BMI 31.24 kg/m   Physical Exam Vitals and nursing note reviewed.  Constitutional:      Appearance: She is well-developed.     Comments: Patient  is able to ambulate independently.  HENT:     Head: Normocephalic and atraumatic.  Musculoskeletal:        General: Normal range of motion.     Cervical back: Normal range of motion.  Neurological:     Mental Status: She is alert and oriented to person, place, and time.    ED Course/Procedures     Procedures  MDM  30 year old female received a signout pending reevaluation and p.o. challenge.  Please see PA Walisiewicz's note for further work-up and medical decision making.  Patient is hypertensive, but vitals are otherwise stable.  Dilaudid has metabolized based on pharmacokinetics of the medication.  She is able to ambulate independently.  She is tolerating p.o. fluids.  She plans to follow-up with urology.  She will continue to go to PT for pelvic floor dysfunction and to follow closely with OB/GYN and urology.  Discussed discharge instructions and home prescriptions.  All questions answered.  ER return precautions given.  She is hemodynamically stable and in no acute distress.  Safe for discharge to home with outpatient follow-up as discussed.      38 A, PA-C 09/06/20 0230    11/06/20, MD 09/07/20 [phone]

## 2020-09-07 LAB — URINE CULTURE: Culture: 6000 — AB

## 2020-09-08 ENCOUNTER — Telehealth: Payer: Self-pay | Admitting: Emergency Medicine

## 2020-09-08 NOTE — Telephone Encounter (Signed)
Post ED Visit - Positive Culture Follow-up  Culture report reviewed by antimicrobial stewardship pharmacist: [facility] []  Nathan Batchelder, Pharm.D. []  , Pharm.D., BCPS AQ-ID []  , Pharm.D., BCPS []  Celedonio Miyamoto, .D., BCPS []  [place], .D., BCPS, AAHIVP []  Georgina Pillion, Pharm.D., BCPS, AAHIVP []  [address], PharmD, BCPS []  , PharmD, BCPS []  [place], PharmD, BCPS []  [place], PharmD []  , PharmD, BCPS []  Estella Husk, PharmD  Pharmacy Team []  Lysle Pearl, PharmD [x]  , PharmD []  Phillips Climes, PharmD []  , Rph []  Agapito Games) , PharmD []  Verlan Friends, PharmD []  , PharmD []  Mervyn Gay, PharmD []  , PharmD []  Vinnie Level, PharmD []  [facility], PharmD []  , PharmD []  Len Childs, PharmD   Positive urine culture Treated with Cephalexin, organism sensitive to the same and no further patient follow-up is required at this time.  [name] 09/08/2020, 1:33 PM

## 2020-09-09 ENCOUNTER — Telehealth: Payer: Self-pay

## 2020-09-09 NOTE — Telephone Encounter (Signed)
Transition Care Management Follow-up Telephone Call Date of discharge and from where: 09/06/2020 from [facility] How have you been since you were released from the hospital? Pt stated that she is feeling better and did not have any questions at this time Any questions or concerns? No  Items Reviewed: Did the pt receive and understand the discharge instructions provided? Yes  Medications obtained and verified? Yes  Other? No  Any new allergies since your discharge? No  Dietary orders reviewed? Yes Do you have support at home? Yes   Functional Questionnaire: (I = Independent and D = Dependent) ADLs: I  Bathing/Dressing- I  Meal Prep- I  Eating- I  Maintaining continence- I  Transferring/Ambulation- I  Managing Meds- I   Follow up appointments reviewed:  PCP Hospital f/u appt confirmed? No   Specialist Hospital f/u appt confirmed? Yes  Scheduled to see Evette Georges, FNP on 09/12/2020 @ 09:40am. Are transportation arrangements needed? No  If their condition worsens, is the pt aware to call PCP or go to the Emergency Dept.? Yes Was the patient provided with contact information for the PCP's office or ED? Yes Was to pt encouraged to call back with questions or concerns? Yes

## 2020-09-13 DIAGNOSIS — R102 Pelvic and perineal pain: Secondary | ICD-10-CM | POA: Diagnosis not present

## 2020-09-13 DIAGNOSIS — M62838 Other muscle spasm: Secondary | ICD-10-CM | POA: Diagnosis not present

## 2020-09-13 DIAGNOSIS — M6281 Muscle weakness (generalized): Secondary | ICD-10-CM | POA: Diagnosis not present

## 2020-09-13 DIAGNOSIS — M5386 Other specified dorsopathies, lumbar region: Secondary | ICD-10-CM | POA: Diagnosis not present

## 2020-09-16 DIAGNOSIS — R102 Pelvic and perineal pain: Secondary | ICD-10-CM | POA: Diagnosis not present

## 2020-09-17 ENCOUNTER — Emergency Department (HOSPITAL_COMMUNITY)
Admission: EM | Admit: 2020-09-17 | Discharge: 2020-09-17 | Disposition: A | Discharge status: Home / Self Care | Payer: Medicaid Other | Attending: Emergency Medicine | Admitting: Emergency Medicine

## 2020-09-17 ENCOUNTER — Encounter (HOSPITAL_COMMUNITY): Payer: Self-pay

## 2020-09-17 ENCOUNTER — Other Ambulatory Visit: Payer: Self-pay

## 2020-09-17 DIAGNOSIS — Z21 Asymptomatic human immunodeficiency virus [HIV] infection status: Secondary | ICD-10-CM | POA: Diagnosis not present

## 2020-09-17 DIAGNOSIS — R350 Frequency of micturition: Secondary | ICD-10-CM | POA: Diagnosis not present

## 2020-09-17 DIAGNOSIS — K6289 Other specified diseases of anus and rectum: Secondary | ICD-10-CM | POA: Insufficient documentation

## 2020-09-17 DIAGNOSIS — R102 Pelvic and perineal pain: Secondary | ICD-10-CM | POA: Insufficient documentation

## 2020-09-17 DIAGNOSIS — R309 Painful micturition, unspecified: Principal | ICD-10-CM

## 2020-09-17 HISTORY — DX: Other specified disorders of muscle: M62.89

## 2020-09-17 MED ORDER — OXYCODONE-ACETAMINOPHEN 5-325 MG PO TABS
2.0000 | ORAL_TABLET | Freq: Once | ORAL | Status: AC
  Administered 2020-09-17: 2 via ORAL
  Filled 2020-09-17: qty 2

## 2020-09-17 MED ORDER — ONDANSETRON HCL 4 MG/2ML IJ SOLN
4.0000 mg | Freq: Once | INTRAMUSCULAR | Status: AC
  Administered 2020-09-17: 4 mg via INTRAVENOUS
  Filled 2020-09-17: qty 2

## 2020-09-17 MED ORDER — OXYCODONE-ACETAMINOPHEN 5-325 MG PO TABS: 1.0000 | ORAL_TABLET | Freq: Four times a day (QID) | ORAL | 0 refills | Status: DC | PRN

## 2020-09-17 MED ORDER — LIDOCAINE 4 % EX CREA: 1.0000 "application " | TOPICAL_CREAM | CUTANEOUS | 0 refills | Status: DC | PRN

## 2020-09-17 MED ORDER — PHENAZOPYRIDINE HCL 200 MG PO TABS: 200.0000 mg | ORAL_TABLET | Freq: Three times a day (TID) | ORAL | 0 refills | Status: DC

## 2020-09-17 MED ORDER — LIDOCAINE 4 % EX CREA: TOPICAL_CREAM | Freq: Three times a day (TID) | CUTANEOUS | Status: DC | PRN

## 2020-09-17 MED ORDER — MORPHINE SULFATE (PF) 4 MG/ML IV SOLN
4.0000 mg | Freq: Once | INTRAVENOUS | Status: AC
Start: 2020-09-17 — End: 2020-09-17
  Administered 2020-09-17: 4 mg via INTRAVENOUS
  Filled 2020-09-17: qty 1

## 2020-09-17 NOTE — Discharge Instructions (Addendum)
It is highly recommended that you recontact the urologist and ask them to follow through with the order for pelvic MRI as well as lumbar spine MRI.  If the diazepam pills are too harsh to use, may consider splitting them into pieces before insertion. May also ask the original prescriber if they can contact a specialty pharmacy that can make intravaginal suppositories of the diazepam.

## 2020-09-17 NOTE — ED Notes (Signed)
Patient refused to ambulate with staff.

## 2020-09-17 NOTE — ED Notes (Signed)
Patient refuses to allow RN to remove IV at this time. She is requesting to speak with the PA before leaving.

## 2020-09-17 NOTE — ED Triage Notes (Signed)
Patient c/o pelvic floor pain. Patient states she has been to an OB/Gyn, urologist and to the ED ,but continues to have pelvic floor pain.

## 2020-09-17 NOTE — ED Provider Notes (Signed)
[facility]-EMERGENCY DEPT Provider Note   CSN: [id number] Arrival date & time: 09/17/20  3710     History Chief Complaint  Patient presents with   Pelvic Pain    Tonya Weeks is a 30 y.o. female.  The history is provided by the patient.      Tonya Weeks is a 30 y.o. female, with a history of HIV, genital herpes, presenting to the ED with pelvic pain for the last several months. She states the pain is in the lower pelvis, vaginal, and rectal regions, has been constant since onset a few months ago, described as a sharp pain with fullness, sometimes radiating to the bilateral hips. She states she has been seen multiple times in urgent care, ED, urology, OB/GYN. She was most recently seen yesterday by Dr. Claiborne Billings, [facility]. She states she is able to urinate and actually has urinary frequency, but she is able to completely empty her bladder. She states she has tried the intravaginal diazepam, but many times she is having too much pain to insert this pill, even with supplemental lubricant. She does not think she has improved with pelvic floor PT. She denies fever/chills, other abdominal discomfort, N/V/C/D, urinary/bowel retention/incontinence, abnormal vaginal discharge/bleeding, numbness, lower extremity weakness, saddle anesthesias, trauma, or any other complaints.    Past Medical History:  Diagnosis Date   Anemia    History of syphilis    Treated during last pregnancy    HIV (human immunodeficiency virus infection) (HCC)    HIV infection (HCC)    HSV infection    Pelvic floor dysfunction    Sickle cell trait (HCC)     Patient Active Problem List   Diagnosis Date Noted   Pregnancy 07/09/2017   Encounter for elective induction of labor 07/08/2017   Nausea & vomiting 07/05/2017   Pregnancy and infectious disease in third trimester 02/11/2017   Hypertension in pregnancy 04/15/2015   Encounter for long-term (current) use of other  medications 10/12/2013   Health care maintenance 10/12/2013   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   Depression 09/05/2012   Syphilis contact, treated 01/07/2012   HIV disease (HCC) 09/01/2011    Past Surgical History:  Procedure Laterality Date   DILATION AND CURETTAGE, DIAGNOSTIC / THERAPEUTIC       OB History     Gravida  3   Para  2   Term  2   Preterm      AB  1   Living  2      SAB      IAB  1   Ectopic      Multiple  0   Live Births  2           Family History  Problem Relation Age of Onset   Lupus Mother    Hypertension Mother    Heart failure Mother    Heart attack Mother    Sickle cell trait Mother    Scleroderma Mother    Hypertension Father    Arthritis Maternal Grandmother    Diabetes Maternal Grandmother    Hypertension Maternal Grandmother    Stroke Maternal Grandmother    Heart failure Maternal Grandmother    Heart attack Maternal Grandmother    Asthma Maternal Grandmother    Arthritis Maternal Grandfather    Diabetes Maternal Grandfather    Hypertension Maternal Grandfather    Stroke Maternal Grandfather    Heart failure Maternal Grandfather    Heart attack Maternal Grandfather  Kidney disease Maternal Grandfather    Other Neg Hx     Social History   Tobacco Use   Smoking status: Never   Smokeless tobacco: Never  Vaping Use   Vaping Use: Never used  Substance Use Topics   Alcohol use: Yes    Comment: Socially    Drug use: No    Home Medications Prior to Admission medications   Medication Sig Start Date End Date Taking? Authorizing Provider  lidocaine (LMX) 4 % cream Apply 1 application topically as needed. 09/17/20  Yes Declyn Offield C, PA-C  oxyCODONE-acetaminophen (PERCOCET/ROXICET) 5-325 MG tablet Take 1 tablet by mouth every 6 (six) hours as needed for severe pain. 09/17/20  Yes Enrico Eaddy C, PA-C  acetaminophen (TYLENOL) 500 MG tablet Take 1,000 mg by mouth every 6 (six) hours as needed for  moderate pain.    [provider]  cephALEXin (KEFLEX) 500 MG capsule Take 1 capsule (500 mg total) by mouth 2 (two) times daily for 14 days. 09/06/20 09/20/20  Namon Cirri E, PA-C  DESCOVY 200-25 MG tablet Take 1 tablet by mouth daily. 06/14/17   [provider]  diazepam (VALIUM) 10 MG tablet Take 10 mg by mouth every 12 (twelve) hours as needed for anxiety. 08/27/20   [provider]  Emtricitabine-Tenofovir DF (TRUVADA PO) Take 1 tablet by mouth daily.    [provider]  naproxen sodium (ALEVE) 220 MG tablet Take 880 mg by mouth daily as needed (pain).    [provider]  phenazopyridine (PYRIDIUM) 200 MG tablet Take 1 tablet (200 mg total) by mouth 3 (three) times daily. 09/17/20   Guillermina Shaft, Hillard Danker, PA-C    Allergies    Patient has no known allergies.  Review of Systems   Review of Systems  Constitutional:  Negative for chills, diaphoresis and fever.  Respiratory:  Negative for shortness of breath.   Cardiovascular:  Negative for chest pain.  Gastrointestinal:  Negative for abdominal pain, diarrhea, nausea and vomiting.  Genitourinary:  Positive for pelvic pain. Negative for difficulty urinating, dysuria, flank pain, genital sores, hematuria, vaginal bleeding and vaginal discharge.  Neurological:  Negative for weakness and numbness.  All other systems reviewed and are negative.  Physical Exam Updated Vital Signs BP (!) 163/124 (BP Location: Left Arm)   Pulse (!) 112   Temp 98.1 F (36.7 C) (Oral)   Resp 17   Ht 5\' 3"  (1.6 m)   Wt 80 kg   LMP 08/29/2020 (Approximate)   SpO2 100%   BMI 31.24 kg/m   Physical Exam Vitals and nursing note reviewed.  Constitutional:      General: She is in acute distress (Pain).     Appearance: She is well-developed. She is not diaphoretic.  HENT:     Head: Normocephalic and atraumatic.     Mouth/Throat:     Mouth: Mucous membranes are moist.     Pharynx: Oropharynx is clear.  Eyes:      Conjunctiva/sclera: Conjunctivae normal.  Cardiovascular:     Rate and Rhythm: Normal rate and regular rhythm.     Pulses: Normal pulses.          Radial pulses are 2+ on the right side and 2+ on the left side.       Posterior tibial pulses are 2+ on the right side and 2+ on the left side.     Comments: Tactile temperature in the extremities appropriate and equal bilaterally. Pulmonary:     Effort: Pulmonary  effort is normal. No respiratory distress.  Abdominal:     Palpations: Abdomen is soft.     Tenderness: There is abdominal tenderness. There is no guarding.     Comments: Some tenderness in the low suprapubic region.  Musculoskeletal:     Cervical back: Neck supple.     Right lower leg: No edema.     Left lower leg: No edema.  Skin:    General: Skin is warm and dry.  Neurological:     Mental Status: She is alert.     Comments: Sensation grossly intact to light touch in the lower extremities bilaterally. No saddle anesthesias. Strength 5/5 in the bilateral lower extremities. No noted gait deficit. Coordination intact.  Psychiatric:        Mood and Affect: Mood and affect normal.        Speech: Speech normal.        Behavior: Behavior normal.    ED Results / Procedures / Treatments   Labs (all labs ordered are listed, but only abnormal results are displayed) Labs Reviewed - No data to display  EKG None  Radiology No results found.  Procedures Procedures   Medications Ordered in ED Medications  morphine 4 MG/ML injection 4 mg (4 mg Intravenous Given 09/17/20 1212)  ondansetron (ZOFRAN) injection 4 mg (4 mg Intravenous Given 09/17/20 1213)    ED Course  I have reviewed the triage vital signs and the nursing notes.  Pertinent labs & imaging results that were available during my care of the patient were reviewed by me and considered in my medical decision making (see chart for details).  Clinical Course as of 09/17/20 1531  Tue Sep 17, 2020  1100 Dr. Delray Altallahan's  [facility], was contacted by unit secretary, requesting callback from Dr. Claiborne Billingsallahan.  They state they will give her the message. [SJ]  1245 Spoke with Dr. Claudine Moutonaam, [facility]. States she will try to get ahold of Dr. Claiborne Billingsallahan. If she is unable, she will call me back. [SJ]  [phone] Spoke with Dr. Claiborne Billingsallahan, patient's OB/GYN. She states she has only seen the patient once since the patient was last pregnant. She saw her in the office yesterday.  She does not think this is a gynecologic issue.  Patient expressed [facility].  She does not think we would need to do additional assessment or testing unless some new piece of information has been presented by the patient today. [SJ]    Clinical Course User Index [SJ] Romulus Hanrahan, Hillard DankerShawn C, PA-C   MDM Rules/Calculators/A&P                          Patient presents with persistent lower pelvic pain for the last few months.  She states this has not changed.  She does appear uncomfortable. Patient is nontoxic appearing, afebrile, not tachycardic on my exam, not tachypneic, not hypotensive, excellent SPO2 on room air.  I have reviewed the patient's chart to obtain more information.   She has had negative testing for GC, chlamydia, and trichomonas.  She underwent exam and evaluation by OB/GYN yesterday. On chart review, patient was seen by urology May 31.  Valium 10 mg per vagina twice daily for 2 weeks to address pelvic floor spasms.  She was given 2 refills and latest refill was filled June 19.  Patient states she is having trouble with these pills because it is  too painful for her to insert them. Pelvic floor PT was also ordered.  Note by Dr. Venetia Constable indicates patient may need pelvic floor MRI if she does not improve.  She contacted the urology office yesterday, they called her back, but she did not answer.  Patient states she plans to call again later today. I also suggest if patient is to get pelvic  MRI, then she would likely benefit from lumbar spine MRI as well.  Bladder scan on June 9 revealed 48ml urine in the bladder.  It appears as though patient continues to have the same pain she has had for a few months with no additional developments. I did my best to facilitate helping the patient as best I could.  I even contacted our in-house pharmacist to see if there was another way for the patient to administer the diazepam intravaginally that may be more comfortable.  It was suggested that she contact the original prescriber (urologist) to see if they will place a prescription to a compounding pharmacy for diazepam vaginal suppositories.   Findings and plan of care discussed with attending physician, Gwyneth Sprout, MD.   Final Clinical Impression(s) / ED Diagnoses Final diagnoses:  Pelvic pain in female    Rx / DC Orders ED Discharge Orders          Ordered    phenazopyridine (PYRIDIUM) 200 MG tablet  3 times daily        09/17/20 1512    lidocaine (LMX) 4 % cream  As needed        09/17/20 1527    oxyCODONE-acetaminophen (PERCOCET/ROXICET) 5-325 MG tablet  Every 6 hours PRN        09/17/20 1529             Anselm Pancoast, PA-C 09/17/20 1537    Gwyneth Sprout, MD 09/23/20 [phone]

## 2020-09-18 ENCOUNTER — Telehealth: Payer: Self-pay | Admitting: *Deleted

## 2020-09-18 NOTE — Telephone Encounter (Signed)
Transition Care Management Follow-up Telephone Call Date of discharge and from where: 09/17/2020 - [facility] How have you been since you were released from the hospital? "No one wants to help me. I am still in so much pain" Any questions or concerns? Yes  Items Reviewed: Did the pt receive and understand the discharge instructions provided? Yes  Medications obtained and verified?  N/A Other? No  Any new allergies since your discharge? No  Dietary orders reviewed? No Do you have support at home? No   Functional Questionnaire: (I = Independent and D = Dependent) ADLs: I  Bathing/Dressing- I  Meal Prep- I  Eating- I  Maintaining continence- I  Transferring/Ambulation- I  Managing Meds- I  Follow up appointments reviewed:  PCP Hospital f/u appt confirmed? No   Specialist Hospital f/u appt confirmed? No   Are transportation arrangements needed?  N/A If their condition worsens, is the pt aware to call PCP or go to the Emergency Dept.? Yes Was the patient provided with contact information for the PCP's office or ED? Yes Was to pt encouraged to call back with questions or concerns? Yes

## 2020-09-19 ENCOUNTER — Emergency Department (HOSPITAL_COMMUNITY)
Admission: EM | Admit: 2020-09-19 | Discharge: 2020-09-20 | Disposition: A | Discharge status: Home / Self Care | Payer: Medicaid Other | Attending: Emergency Medicine | Admitting: Emergency Medicine

## 2020-09-19 ENCOUNTER — Encounter (HOSPITAL_COMMUNITY): Payer: Self-pay | Admitting: *Deleted

## 2020-09-19 ENCOUNTER — Emergency Department (HOSPITAL_COMMUNITY): Payer: Medicaid Other

## 2020-09-19 DIAGNOSIS — R3915 Urgency of urination: Secondary | ICD-10-CM | POA: Diagnosis not present

## 2020-09-19 DIAGNOSIS — R531 Weakness: Secondary | ICD-10-CM | POA: Insufficient documentation

## 2020-09-19 DIAGNOSIS — R102 Pelvic and perineal pain: Secondary | ICD-10-CM | POA: Insufficient documentation

## 2020-09-19 DIAGNOSIS — Z21 Asymptomatic human immunodeficiency virus [HIV] infection status: Secondary | ICD-10-CM | POA: Diagnosis not present

## 2020-09-19 DIAGNOSIS — R000 Tachycardia, unspecified: Secondary | ICD-10-CM | POA: Diagnosis not present

## 2020-09-19 DIAGNOSIS — N3289 Other specified disorders of bladder: Secondary | ICD-10-CM | POA: Diagnosis not present

## 2020-09-19 DIAGNOSIS — M545 Low back pain, unspecified: Secondary | ICD-10-CM | POA: Diagnosis not present

## 2020-09-19 DIAGNOSIS — I10 Essential (primary) hypertension: Secondary | ICD-10-CM | POA: Diagnosis not present

## 2020-09-19 DIAGNOSIS — R300 Dysuria: Secondary | ICD-10-CM | POA: Diagnosis not present

## 2020-09-19 DIAGNOSIS — N83201 Unspecified ovarian cyst, right side: Secondary | ICD-10-CM | POA: Diagnosis not present

## 2020-09-19 DIAGNOSIS — N3000 Acute cystitis without hematuria: Secondary | ICD-10-CM | POA: Diagnosis not present

## 2020-09-19 DIAGNOSIS — N3090 Cystitis, unspecified without hematuria: Secondary | ICD-10-CM | POA: Diagnosis not present

## 2020-09-19 DIAGNOSIS — R1032 Left lower quadrant pain: Secondary | ICD-10-CM | POA: Diagnosis not present

## 2020-09-19 LAB — CBC WITH DIFFERENTIAL/PLATELET
Abs Immature Granulocytes: 0.01 10*3/uL (ref 0.00–0.07)
Basophils Absolute: 0 10*3/uL (ref 0.0–0.1)
Basophils Relative: 0 %
Eosinophils Absolute: 0.2 10*3/uL (ref 0.0–0.5)
Eosinophils Relative: 5 %
HCT: 31.9 % — ABNORMAL LOW (ref 36.0–46.0)
Hemoglobin: 9.6 g/dL — ABNORMAL LOW (ref 12.0–15.0)
Immature Granulocytes: 0 %
Lymphocytes Relative: 40 %
Lymphs Abs: 1.9 10*3/uL (ref 0.7–4.0)
MCH: 20.8 pg — ABNORMAL LOW (ref 26.0–34.0)
MCHC: 30.1 g/dL (ref 30.0–36.0)
MCV: 69 fL — ABNORMAL LOW (ref 80.0–100.0)
Monocytes Absolute: 0.2 10*3/uL (ref 0.1–1.0)
Monocytes Relative: 4 %
Neutro Abs: 2.4 10*3/uL (ref 1.7–7.7)
Neutrophils Relative %: 51 %
Platelets: 137 10*3/uL — ABNORMAL LOW (ref 150–400)
RBC: 4.62 MIL/uL (ref 3.87–5.11)
RDW: 19.2 % — ABNORMAL HIGH (ref 11.5–15.5)
WBC: 4.7 10*3/uL (ref 4.0–10.5)
nRBC: 0 % (ref 0.0–0.2)

## 2020-09-19 LAB — HCG, SERUM, QUALITATIVE: Preg, Serum: NEGATIVE

## 2020-09-19 LAB — BASIC METABOLIC PANEL
Anion gap: 7 (ref 5–15)
BUN: 8 mg/dL (ref 6–20)
CO2: 23 mmol/L (ref 22–32)
Calcium: 9.2 mg/dL (ref 8.9–10.3)
Chloride: 105 mmol/L (ref 98–111)
Creatinine, Ser: 0.81 mg/dL (ref 0.44–1.00)
GFR, Estimated: >60 mL/min (ref >60)
Glucose, Bld: 78 mg/dL (ref 70–99)
Potassium: 4.1 mmol/L (ref 3.5–5.1)
Sodium: 135 mmol/L (ref 135–145)

## 2020-09-19 LAB — URINALYSIS, ROUTINE W REFLEX MICROSCOPIC
Bilirubin Urine: NEGATIVE
Glucose, UA: NEGATIVE mg/dL
Hgb urine dipstick: NEGATIVE
Ketones, ur: NEGATIVE mg/dL
Nitrite: NEGATIVE
Protein, ur: 30 mg/dL — AB
Specific Gravity, Urine: 1.01 (ref 1.005–1.030)
WBC, UA: >50 WBC/hpf — ABNORMAL HIGH (ref 0–5)
pH: 9 — ABNORMAL HIGH (ref 5.0–8.0)

## 2020-09-19 MED ORDER — HYDROMORPHONE HCL 1 MG/ML IJ SOLN
0.5000 mg | Freq: Once | INTRAMUSCULAR | Status: AC
  Administered 2020-09-19: 0.5 mg via INTRAVENOUS
  Filled 2020-09-19: qty 1

## 2020-09-19 MED ORDER — ONDANSETRON HCL 4 MG/2ML IJ SOLN
4.0000 mg | Freq: Once | INTRAMUSCULAR | Status: AC
  Administered 2020-09-19: 4 mg via INTRAVENOUS
  Filled 2020-09-19: qty 2

## 2020-09-19 MED ORDER — MORPHINE SULFATE (PF) 4 MG/ML IV SOLN
4.0000 mg | Freq: Once | INTRAVENOUS | Status: AC
  Administered 2020-09-19: 4 mg via INTRAVENOUS
  Filled 2020-09-19: qty 1

## 2020-09-19 NOTE — ED Provider Notes (Signed)
[facility]-EMERGENCY DEPT Provider Note   CSN: [id number] Arrival date & time: 09/19/20  1425     History Chief Complaint  Patient presents with   Pelvic Pain    Tonya DawesBrittani Weeks is a 30 y.o. female.  30 year old female brought in by EMS with ongoing pelvic pain, dysuria (described as vaginal spasms), urinary urgency, lower extremity weakness (described as having to walk hunched over due to pain). Patient reports onset of symptoms 2 months ago, has been seen by the ER, urology, gyn, has attended pelvic floor PT, has tried vaginal valium, without any improvement in her symptoms.  Reports pain and weakness resolve with pain medications. History of HIV, reports compliance with medications and is trying to [facility] providers [facility]. History of gestational HTN, has been monitoring her BP at home and states it is 140s/90s when she is resting and not in pain, discussed importance of PCP follow up for this.    UA positive but culture negative 07/28/20, returned 08/09/20 with ongoing symptoms and pelvic pain, treated with Macrobid.  Returned 08/13/20 with ongoing abdominal pain- CT with mild inflammatory changes, question interstitial cystitis and was referred to urology. Seen by urology 08/27/20 referred for pelvic PT, given vaginal valium, reported pain when beginning to walk. 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 09/05/20 returned to ER ongoing pelvic pain, worse with ambulation, pain controlled, able to ambulate given Rocephin, dc home on Keflex, thought to be interstitial cystitis and referred back to urology.  09/16/20 Seen by GYN, negative gonorrhea, chlamydia, trich.  09/17/20 returned to ER call to GYN, consider pelvic MRI, possibly lumbar.       Past Medical History:  Diagnosis Date   Anemia    History of syphilis    Treated during last pregnancy    HIV (human  immunodeficiency virus infection) (HCC)    HIV infection (HCC)    HSV infection    Pelvic floor dysfunction    Sickle cell trait [facility](HCC)     Patient Active Problem List   Diagnosis Date Noted   Pregnancy 07/09/2017   Encounter for elective induction of labor 07/08/2017   Nausea & vomiting 07/05/2017   Pregnancy and infectious disease in third trimester 02/11/2017   Hypertension in pregnancy 04/15/2015   Encounter for long-term (current) use of other medications 10/12/2013   Health care maintenance 10/12/2013   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   Depression 09/05/2012   Syphilis contact, treated 01/07/2012   HIV disease (HCC) 09/01/2011    Past Surgical History:  Procedure Laterality Date   DILATION AND CURETTAGE, DIAGNOSTIC / THERAPEUTIC       OB History     Gravida  3   Para  2   Term  2   Preterm      AB  1   Living  2      SAB      IAB  1   Ectopic      Multiple  0   Live Births  2           Family History  Problem Relation Age of Onset   Lupus Mother    Hypertension Mother    Heart failure Mother    Heart attack Mother    Sickle cell trait Mother    Scleroderma Mother    Hypertension Father    Arthritis Maternal  Grandmother    Diabetes Maternal Grandmother    Hypertension Maternal Grandmother    Stroke Maternal Grandmother    Heart failure Maternal Grandmother    Heart attack Maternal Grandmother    Asthma Maternal Grandmother    Arthritis Maternal Grandfather    Diabetes Maternal Grandfather    Hypertension Maternal Grandfather    Stroke Maternal Grandfather    Heart failure Maternal Grandfather    Heart attack Maternal Grandfather    Kidney disease Maternal Grandfather    Other Neg Hx     Social History   Tobacco Use   Smoking status: Never   Smokeless tobacco: Never  Vaping Use   Vaping Use: Never used  Substance Use Topics   Alcohol use: Yes    Comment: Socially    Drug use: No    Home  Medications Prior to Admission medications   Medication Sig Start Date End Date Taking? Authorizing Provider  acetaminophen (TYLENOL) 500 MG tablet Take 1,000 mg by mouth every 6 (six) hours as needed for moderate pain.    [provider]  cephALEXin (KEFLEX) 500 MG capsule Take 1 capsule (500 mg total) by mouth 2 (two) times daily for 14 days. 09/06/20 09/20/20  Namon Cirri E, PA-C  DESCOVY 200-25 MG tablet Take 1 tablet by mouth daily. 06/14/17   [provider]  diazepam (VALIUM) 10 MG tablet Take 10 mg by mouth every 12 (twelve) hours as needed for anxiety. 08/27/20   [provider]  Emtricitabine-Tenofovir DF (TRUVADA PO) Take 1 tablet by mouth daily.    [provider]  lidocaine (LMX) 4 % cream Apply 1 application topically as needed. 09/17/20   Joy, Shawn C, PA-C  naproxen sodium (ALEVE) 220 MG tablet Take 880 mg by mouth daily as needed (pain).    [provider]  oxyCODONE-acetaminophen (PERCOCET/ROXICET) 5-325 MG tablet Take 1 tablet by mouth every 6 (six) hours as needed for severe pain. 09/17/20   Joy, Shawn C, PA-C  phenazopyridine (PYRIDIUM) 200 MG tablet Take 1 tablet (200 mg total) by mouth 3 (three) times daily. 09/17/20   Joy, Hillard Danker, PA-C    Allergies    Patient has no known allergies.  Review of Systems   Review of Systems  Constitutional:  Negative for chills, diaphoresis and fever.  Respiratory:  Negative for shortness of breath.   Cardiovascular:  Negative for chest pain.  Gastrointestinal:  Positive for abdominal pain. Negative for constipation, diarrhea, nausea and vomiting.  Genitourinary:  Positive for dysuria, frequency, pelvic pain, urgency and vaginal pain.  Musculoskeletal:  Positive for myalgias. Negative for arthralgias.  Skin:  Negative for rash and wound.  Allergic/Immunologic: Positive for immunocompromised state.  Neurological:  Positive for weakness. Negative for numbness.  Hematological:  Negative for  adenopathy.  Psychiatric/Behavioral:  Negative for confusion.   All other systems reviewed and are negative.  Physical Exam Updated Vital Signs BP (!) 151/113   Pulse 86   Temp 98.7 F (37.1 C) (Oral)   Resp 19   Ht 5\' 3"  (1.6 m)   Wt 73 kg   LMP 08/29/2020 (Approximate)   SpO2 100%   BMI 28.52 kg/m   Physical Exam Vitals and nursing note reviewed.  Constitutional:      General: She is not in acute distress.    Appearance: She is well-developed. She is not diaphoretic.  HENT:     Head: Normocephalic and atraumatic.  Cardiovascular:     Rate and Rhythm: Normal rate and  regular rhythm.     Heart sounds: Normal heart sounds.  Pulmonary:     Effort: Pulmonary effort is normal.     Breath sounds: Normal breath sounds.  Abdominal:     Palpations: Abdomen is soft.     Tenderness: There is abdominal tenderness.    Musculoskeletal:        General: No swelling or tenderness.     Right lower leg: No edema.     Left lower leg: No edema.  Skin:    General: Skin is warm and dry.     Findings: No erythema or rash.  Neurological:     Mental Status: She is alert and oriented to person, place, and time.     Sensory: No sensory deficit.     Motor: No weakness.     Deep Tendon Reflexes: Babinski sign absent on the right side. Babinski sign absent on the left side.     Reflex Scores:      Patellar reflexes are 2+ on the right side and 2+ on the left side.      Achilles reflexes are 1+ on the right side and 1+ on the left side. Psychiatric:        Behavior: Behavior normal.    ED Results / Procedures / Treatments   Labs (all labs ordered are listed, but only abnormal results are displayed) Labs Reviewed  CBC WITH DIFFERENTIAL/PLATELET - Abnormal; Notable for the following components:      Result Value   Hemoglobin 9.6 (*)    HCT 31.9 (*)    MCV 69.0 (*)    MCH 20.8 (*)    RDW 19.2 (*)    Platelets 137 (*)    All other components within normal limits  URINALYSIS, ROUTINE  W REFLEX MICROSCOPIC - Abnormal; Notable for the following components:   Color, Urine AMBER (*)    APPearance HAZY (*)    pH 9.0 (*)    Protein, ur 30 (*)    Leukocytes,Ua LARGE (*)    WBC, UA >50 (*)    Bacteria, UA MANY (*)    All other components within normal limits  URINE CULTURE  BASIC METABOLIC PANEL  HCG, SERUM, QUALITATIVE    EKG None  Radiology No results found.  Procedures Procedures   Medications Ordered in ED Medications  ondansetron (ZOFRAN) injection 4 mg (4 mg Intravenous Given 09/19/20 1626)  morphine 4 MG/ML injection 4 mg (4 mg Intravenous Given 09/19/20 1627)  morphine 4 MG/ML injection 4 mg (4 mg Intravenous Given 09/19/20 1924)  HYDROmorphone (DILAUDID) injection 0.5 mg (0.5 mg Intravenous Given 09/19/20 2043)    ED Course  I have reviewed the triage vital signs and the nursing notes.  Pertinent labs & imaging results that were available during my care of the patient were reviewed by me and considered in my medical decision making (see chart for details).  Clinical Course as of 09/19/20 2355  Thu Sep 19, 2020  3563 30 year old female with complaint of pelvic pain as detailed above.  On exam, no leg weakness appreciated, able to flex hips, extend leg, normal foot/ankle strength, lower extremity reflexes normal. Patient has been seen by GYN with recent pelvic exam therefore this was not repeated tonight.   Discussed with Dr. Freida Busman, ER attending, plan is to evaluate with MRI pelvis and lumbar spine.  Advised patient this exhausts available testing in the ER and if no source for her pain is found, she will need to continue  with outpatient work up. Labs without significant changes from prior including CMP and CBC, hcg negative.  UA with persistent large leukocytes, now with many bacteria, will add culture as patient has been treated several times in the past without improvement in symptoms and with negative cultures.  [LM]  2354 Care signed out at change of  shift pending imaging.  [LM]    Clinical Course User Index [LM] Alden Hipp   MDM Rules/Calculators/A&P                           Final Clinical Impression(s) / ED Diagnoses Final diagnoses:  Pelvic pain in female  Hypertension, unspecified type    Rx / DC Orders ED Discharge Orders     None        Alden Hipp 09/19/20 2355    Lorre Nick, MD 09/21/20 [phone]

## 2020-09-19 NOTE — ED Notes (Addendum)
Pt currently drinking contrast in preparation for MRI at 21:45

## 2020-09-19 NOTE — ED Triage Notes (Signed)
Per EMS, pt complains of pelvic pain since 10AM this morning. Hx of pelvic floor dysfunction. She has been hypotensive and tachycardic with 10/10 pain en route.   BP 215/110 HR 120 RR 24 Temp 98.4

## 2020-09-19 NOTE — ED Notes (Signed)
Pt transported to MRI 

## 2020-09-19 NOTE — ED Notes (Signed)
PA-C at the bedside to evaluate.  

## 2020-09-19 NOTE — Discharge Instructions (Addendum)
Monitor your blood pressure as discussed (record time of day, degree of pain as well as BP), take to your primary care provider for further management.  Continue taking the prescription for the Keflex you have at home until gone in treatment of a urinary tract infection.   Schedule follow up with [facility] as referred as soon as possible to continue the evaluation to determine the cause of your persistent pelvic pain.

## 2020-09-19 NOTE — ED Notes (Signed)
Called MRI, MRI said they would pick up pt in 10 minutes.

## 2020-09-19 NOTE — ED Notes (Signed)
Per Gay Filler, with MRI, pt to receive PO contrast, administered by MRI and pt to MRI, unknown ETA.

## 2020-09-19 NOTE — ED Provider Notes (Addendum)
Pelvic pain x 2 months Has been seen by multiple specialties Pain persists ?UTI today, culture pending, waiting for culture results to treat (no fever, dysuria)  Pending MRI as recommended by documentation in chart  If negative, can discharge home  MRI results c/w cystitis. IV Rocephin given here, will discharge with Kelfex. Also shows bilateral lymphadenopathy, reactive vs lymphoproliferative. Recommends reimaging in 3 months vs further evaluation with PET scan.   Results were relayed to patient and to mom (on the phone). Encouraged to pursue the referral to Urogynecology at [facility]. Patient states she has confirmed that [facility] has received the referral and is waiting on scheduling.   She can be discharged home.     Elpidio Anis, PA-C 09/20/20 0303  ADDENDUM: on review the patient has already been prescribed Keflex and has this at home. She is strongly encouraged to take as prescribed for infection.    Elpidio Anis, PA-C 09/20/20 2703    Lorre Nick, MD 09/21/20 [id number]

## 2020-09-19 NOTE — ED Notes (Addendum)
This nurse entered the pt's room and attempted to have her change into a gown and stated to the pt she would need to be attached to the cardiac monitor and have blood work taken. Pt uncooperative at this time. PA-C Army Melia notified and aware. No additional orders at this time.

## 2020-09-20 DIAGNOSIS — N83201 Unspecified ovarian cyst, right side: Secondary | ICD-10-CM | POA: Diagnosis not present

## 2020-09-20 DIAGNOSIS — M545 Low back pain, unspecified: Secondary | ICD-10-CM | POA: Diagnosis not present

## 2020-09-20 DIAGNOSIS — N3289 Other specified disorders of bladder: Secondary | ICD-10-CM | POA: Diagnosis not present

## 2020-09-20 DIAGNOSIS — N3090 Cystitis, unspecified without hematuria: Secondary | ICD-10-CM | POA: Diagnosis not present

## 2020-09-20 DIAGNOSIS — R1032 Left lower quadrant pain: Secondary | ICD-10-CM | POA: Diagnosis not present

## 2020-09-20 MED ORDER — KETOROLAC TROMETHAMINE 30 MG/ML IJ SOLN
15.0000 mg | Freq: Once | INTRAMUSCULAR | Status: AC
  Administered 2020-09-20: 15 mg via INTRAVENOUS
  Filled 2020-09-20: qty 1

## 2020-09-20 MED ORDER — HYDROMORPHONE HCL 1 MG/ML IJ SOLN
0.5000 mg | Freq: Once | INTRAMUSCULAR | Status: AC
  Administered 2020-09-20: 0.5 mg via INTRAVENOUS
  Filled 2020-09-20: qty 1

## 2020-09-20 MED ORDER — GADOBUTROL 1 MMOL/ML IV SOLN
7.0000 mL | Freq: Once | INTRAVENOUS | Status: AC | PRN
  Administered 2020-09-20: 7 mL via INTRAVENOUS

## 2020-09-20 MED ORDER — SODIUM CHLORIDE 0.9 % IV SOLN
1.0000 g | Freq: Once | INTRAVENOUS | Status: AC
  Administered 2020-09-20: 1 g via INTRAVENOUS
  Filled 2020-09-20: qty 10

## 2020-09-20 MED ORDER — PHENAZOPYRIDINE HCL 200 MG PO TABS: 200.0000 mg | ORAL_TABLET | Freq: Three times a day (TID) | ORAL | 0 refills | Status: DC

## 2020-09-21 LAB — URINE CULTURE

## 2020-09-23 ENCOUNTER — Ambulatory Visit
Admit: 2020-09-23 | Discharge: 2020-09-24 | Disposition: A | Discharge status: Home / Self Care | Payer: BLUE CROSS/BLUE SHIELD

## 2020-09-23 ENCOUNTER — Telehealth: Payer: Self-pay

## 2020-09-23 DIAGNOSIS — R102 Pelvic and perineal pain: Secondary | ICD-10-CM | POA: Diagnosis not present

## 2020-09-23 DIAGNOSIS — N390 Urinary tract infection, site not specified: Secondary | ICD-10-CM | POA: Diagnosis not present

## 2020-09-23 DIAGNOSIS — G8929 Other chronic pain: Secondary | ICD-10-CM | POA: Diagnosis not present

## 2020-09-23 NOTE — Telephone Encounter (Signed)
Transition Care Management Follow-up Telephone Call Date of discharge and from where: 09/19/2020 from [facility] How have you been since you were released from the hospital? Pt is still in pain and is headed to [facility] ED Any questions or concerns? No  Items Reviewed: Did the pt receive and understand the discharge instructions provided? Yes  Medications obtained and verified? Yes  Other? No  Any new allergies since your discharge? No  Dietary orders reviewed? No Do you have support at home? Yes   Functional Questionnaire: (I = Independent and D = Dependent) ADLs: I  Bathing/Dressing- I  Meal Prep- I  Eating- I  Maintaining continence- I  Transferring/Ambulation- I  Managing Meds- I   Follow up appointments reviewed:  PCP Hospital f/u appt confirmed? No   Specialist Hospital f/u appt confirmed? No   Are transportation arrangements needed? No  If their condition worsens, is the pt aware to call PCP or go to the Emergency Dept.? Yes Was the patient provided with contact information for the PCP's office or ED? Yes Was to pt encouraged to call back with questions or concerns? Yes

## 2020-09-24 DIAGNOSIS — R102 Pelvic and perineal pain: Secondary | ICD-10-CM | POA: Diagnosis not present

## 2020-09-24 DIAGNOSIS — N390 Urinary tract infection, site not specified: Secondary | ICD-10-CM | POA: Diagnosis not present

## 2020-09-24 DIAGNOSIS — G8929 Other chronic pain: Secondary | ICD-10-CM | POA: Diagnosis not present

## 2020-09-24 MED ORDER — GABAPENTIN 100 MG CAPSULE
ORAL_CAPSULE | Freq: Three times a day (TID) | ORAL | 0 refills | 30 days | Status: CP
Start: 2020-09-24 — End: 2020-10-24

## 2020-09-24 MED ORDER — DIAZEPAM 5 MG TABLET
ORAL_TABLET | Freq: Two times a day (BID) | VAGINAL | 0 refills | 5 days | Status: CP
Start: 2020-09-24 — End: 2020-09-29

## 2020-09-24 MED ORDER — NITROFURANTOIN MONOHYDRATE/MACROCRYSTALS 100 MG CAPSULE
ORAL_CAPSULE | Freq: Two times a day (BID) | ORAL | 0 refills | 7 days | Status: CP
Start: 2020-09-24 — End: 2020-10-01

## 2020-09-24 MED ORDER — METHOCARBAMOL 500 MG TABLET
ORAL_TABLET | Freq: Two times a day (BID) | ORAL | 0 refills | 10 days | Status: CP
Start: 2020-09-24 — End: 2020-10-04

## 2020-09-24 MED ORDER — DULOXETINE 20 MG CAPSULE,DELAYED RELEASE
ORAL_CAPSULE | Freq: Every day | ORAL | 0 refills | 30 days | Status: CP
Start: 2020-09-24 — End: 2020-10-24

## 2020-10-04 DIAGNOSIS — M5386 Other specified dorsopathies, lumbar region: Secondary | ICD-10-CM | POA: Diagnosis not present

## 2020-10-04 DIAGNOSIS — M62838 Other muscle spasm: Secondary | ICD-10-CM | POA: Diagnosis not present

## 2020-10-04 DIAGNOSIS — R102 Pelvic and perineal pain: Secondary | ICD-10-CM | POA: Diagnosis not present

## 2020-10-04 DIAGNOSIS — M6281 Muscle weakness (generalized): Secondary | ICD-10-CM | POA: Diagnosis not present

## 2020-10-08 DIAGNOSIS — N9489 Other specified conditions associated with female genital organs and menstrual cycle: Secondary | ICD-10-CM | POA: Diagnosis not present

## 2020-10-08 DIAGNOSIS — Z87440 Personal history of urinary (tract) infections: Secondary | ICD-10-CM | POA: Diagnosis not present

## 2020-10-18 DIAGNOSIS — M6281 Muscle weakness (generalized): Secondary | ICD-10-CM | POA: Diagnosis not present

## 2020-10-18 DIAGNOSIS — R102 Pelvic and perineal pain: Secondary | ICD-10-CM | POA: Diagnosis not present

## 2020-10-18 DIAGNOSIS — M62838 Other muscle spasm: Secondary | ICD-10-CM | POA: Diagnosis not present

## 2020-10-18 DIAGNOSIS — M5386 Other specified dorsopathies, lumbar region: Secondary | ICD-10-CM | POA: Diagnosis not present

## 2020-10-28 DIAGNOSIS — R102 Pelvic and perineal pain: Secondary | ICD-10-CM | POA: Diagnosis not present

## 2020-10-28 DIAGNOSIS — Z87440 Personal history of urinary (tract) infections: Secondary | ICD-10-CM | POA: Diagnosis not present

## 2020-11-05 DIAGNOSIS — M2550 Pain in unspecified joint: Secondary | ICD-10-CM | POA: Diagnosis not present

## 2020-11-08 DIAGNOSIS — M5386 Other specified dorsopathies, lumbar region: Secondary | ICD-10-CM | POA: Diagnosis not present

## 2020-11-08 DIAGNOSIS — M6281 Muscle weakness (generalized): Secondary | ICD-10-CM | POA: Diagnosis not present

## 2020-11-08 DIAGNOSIS — R102 Pelvic and perineal pain: Secondary | ICD-10-CM | POA: Diagnosis not present

## 2020-11-08 DIAGNOSIS — M62838 Other muscle spasm: Secondary | ICD-10-CM | POA: Diagnosis not present

## 2020-11-29 DIAGNOSIS — R102 Pelvic and perineal pain: Secondary | ICD-10-CM | POA: Diagnosis not present

## 2020-11-29 DIAGNOSIS — Z5181 Encounter for therapeutic drug level monitoring: Secondary | ICD-10-CM | POA: Diagnosis not present

## 2020-11-29 DIAGNOSIS — Z87440 Personal history of urinary (tract) infections: Secondary | ICD-10-CM | POA: Diagnosis not present

## 2020-12-17 DIAGNOSIS — M6289 Other specified disorders of muscle: Secondary | ICD-10-CM | POA: Insufficient documentation

## 2020-12-17 DIAGNOSIS — R102 Pelvic and perineal pain: Secondary | ICD-10-CM | POA: Insufficient documentation

## 2020-12-17 DIAGNOSIS — N3010 Interstitial cystitis (chronic) without hematuria: Secondary | ICD-10-CM | POA: Insufficient documentation

## 2020-12-17 DIAGNOSIS — Z87440 Personal history of urinary (tract) infections: Secondary | ICD-10-CM | POA: Insufficient documentation

## 2020-12-25 ENCOUNTER — Other Ambulatory Visit: Payer: Self-pay

## 2020-12-25 ENCOUNTER — Emergency Department (HOSPITAL_COMMUNITY)
Admission: EM | Admit: 2020-12-25 | Discharge: 2020-12-25 | Disposition: A | Discharge status: Home / Self Care | Payer: Medicaid Other | Attending: Emergency Medicine | Admitting: Emergency Medicine

## 2020-12-25 ENCOUNTER — Encounter (HOSPITAL_COMMUNITY): Payer: Self-pay | Admitting: Emergency Medicine

## 2020-12-25 DIAGNOSIS — Z21 Asymptomatic human immunodeficiency virus [HIV] infection status: Secondary | ICD-10-CM | POA: Diagnosis not present

## 2020-12-25 DIAGNOSIS — R102 Pelvic and perineal pain: Secondary | ICD-10-CM | POA: Insufficient documentation

## 2020-12-25 LAB — BASIC METABOLIC PANEL
Anion gap: 7 (ref 5–15)
BUN: 8 mg/dL (ref 6–20)
CO2: 25 mmol/L (ref 22–32)
Calcium: 10.2 mg/dL (ref 8.9–10.3)
Chloride: 107 mmol/L (ref 98–111)
Creatinine, Ser: 0.79 mg/dL (ref 0.44–1.00)
GFR, Estimated: >60 mL/min (ref >60)
Glucose, Bld: 96 mg/dL (ref 70–99)
Potassium: 4.4 mmol/L (ref 3.5–5.1)
Sodium: 139 mmol/L (ref 135–145)

## 2020-12-25 LAB — CBC WITH DIFFERENTIAL/PLATELET
Abs Immature Granulocytes: 0.02 10*3/uL (ref 0.00–0.07)
Basophils Absolute: 0 10*3/uL (ref 0.0–0.1)
Basophils Relative: 0 %
Eosinophils Absolute: 0.1 10*3/uL (ref 0.0–0.5)
Eosinophils Relative: 1 %
HCT: 32.8 % — ABNORMAL LOW (ref 36.0–46.0)
Hemoglobin: 9.9 g/dL — ABNORMAL LOW (ref 12.0–15.0)
Immature Granulocytes: 0 %
Lymphocytes Relative: 58 %
Lymphs Abs: 4.2 10*3/uL — ABNORMAL HIGH (ref 0.7–4.0)
MCH: 21.9 pg — ABNORMAL LOW (ref 26.0–34.0)
MCHC: 30.2 g/dL (ref 30.0–36.0)
MCV: 72.4 fL — ABNORMAL LOW (ref 80.0–100.0)
Monocytes Absolute: 0.4 10*3/uL (ref 0.1–1.0)
Monocytes Relative: 6 %
Neutro Abs: 2.6 10*3/uL (ref 1.7–7.7)
Neutrophils Relative %: 35 %
Platelets: 262 10*3/uL (ref 150–400)
RBC: 4.53 MIL/uL (ref 3.87–5.11)
RDW: 20 % — ABNORMAL HIGH (ref 11.5–15.5)
WBC: 7.3 10*3/uL (ref 4.0–10.5)
nRBC: 0 % (ref 0.0–0.2)

## 2020-12-25 LAB — URINALYSIS, ROUTINE W REFLEX MICROSCOPIC
Bilirubin Urine: NEGATIVE
Glucose, UA: NEGATIVE mg/dL
Ketones, ur: NEGATIVE mg/dL
Nitrite: NEGATIVE
Protein, ur: 30 mg/dL — AB
Specific Gravity, Urine: 1.01 (ref 1.005–1.030)
WBC, UA: >50 WBC/hpf — ABNORMAL HIGH (ref 0–5)
pH: 6 (ref 5.0–8.0)

## 2020-12-25 MED ORDER — HYDROMORPHONE HCL 1 MG/ML IJ SOLN
1.0000 mg | Freq: Once | INTRAMUSCULAR | Status: AC
  Administered 2020-12-25: 1 mg via INTRAVENOUS
  Filled 2020-12-25: qty 1

## 2020-12-25 MED ORDER — ONDANSETRON HCL 4 MG/2ML IJ SOLN
4.0000 mg | Freq: Once | INTRAMUSCULAR | Status: AC
Start: 2020-12-25 — End: 2020-12-25
  Administered 2020-12-25: 4 mg via INTRAVENOUS
  Filled 2020-12-25: qty 2

## 2020-12-25 MED ORDER — CYCLOBENZAPRINE HCL 10 MG PO TABS
5.0000 mg | ORAL_TABLET | Freq: Once | ORAL | Status: AC
  Administered 2020-12-25: 5 mg via ORAL
  Filled 2020-12-25: qty 1

## 2020-12-25 MED ORDER — ONDANSETRON 4 MG PO TBDP: 4.0000 mg | ORAL_TABLET | Freq: Three times a day (TID) | ORAL | 0 refills | Status: DC | PRN

## 2020-12-25 MED ORDER — ONDANSETRON 4 MG PO TBDP
4.0000 mg | ORAL_TABLET | Freq: Once | ORAL | Status: AC
  Administered 2020-12-25: 4 mg via ORAL
  Filled 2020-12-25: qty 1

## 2020-12-25 MED ORDER — SODIUM CHLORIDE 0.9 % IV BOLUS
1000.0000 mL | Freq: Once | INTRAVENOUS | Status: AC
  Administered 2020-12-25: 1000 mL via INTRAVENOUS

## 2020-12-25 MED ORDER — HYDROMORPHONE HCL 1 MG/ML IJ SOLN
1.0000 mg | Freq: Once | INTRAMUSCULAR | Status: AC
Start: 2020-12-25 — End: 2020-12-25
  Administered 2020-12-25: 1 mg via INTRAVENOUS
  Filled 2020-12-25: qty 1

## 2020-12-25 NOTE — ED Triage Notes (Addendum)
Pt BIB EMS from home c/o chronic pelvic pain/spasms that radiates down both legs and to buttocks. Also reports difficulty passing stool. Denies upper abdominal pain and N/V. Hx of pelvic floor dysfunction and being follow by specialist for same.

## 2020-12-25 NOTE — ED Provider Notes (Signed)
  Care of patient assumed from PA Henderly at [phone].  Agree with history, physical exam and plan.  See their note for further details. Briefly, 30 year old female with history of HIV, pelvic floor dysfunction, chronic interstitial cystitis followed by daily starting urogynecology as well as pain management.  Patient presents emergency department with chief complaint of pelvic pain.  Pelvic pain described as "spasming."   Physical Exam  BP (!) 156/118   Pulse 88   Temp 98 F (36.7 C) (Oral)   Resp 16   SpO2 100%   Physical Exam Vitals and nursing note reviewed.  Constitutional:      General: She is not in acute distress.    Appearance: She is not ill-appearing, toxic-appearing or diaphoretic.  HENT:     Head: Normocephalic.  Eyes:     General: No scleral icterus.       Right eye: No discharge.        Left eye: No discharge.  Cardiovascular:     Rate and Rhythm: Normal rate.  Pulmonary:     Effort: Pulmonary effort is normal.  Abdominal:     General: Abdomen is flat. There is no distension. There are no signs of injury.     Palpations: Abdomen is soft. There is no mass or pulsatile mass.     Tenderness: There is abdominal tenderness in the suprapubic area.     Hernia: There is no hernia in the umbilical area or ventral area.  Skin:    General: Skin is warm and dry.  Neurological:     General: No focal deficit present.     Mental Status: She is alert.  Psychiatric:        Behavior: Behavior is cooperative.    ED Course/Procedures     Procedures  MDM   Patient deferred pelvic exam from previous provider.  BMP unremarkable.  CBC shows anemia however baseline for patient.  Urinalysis shows bacteria rare, WBC greater than 50.  Urinalysis is similar to previous obtained which had no growth on culture.  Patient has no urinary symptoms, low suspicion for UTI at this time.  Plan for continued pain management and follow-up with pain clinic in urogynecology she has been  Patient  complains of continued pain.  We will give patient muscle relaxer and second round of pain medication.  Patient reports improvement in her symptoms after receiving these medications.  Plan to discharge to follow-up with her urogynecologist Dr. Logan Bores as well as pain clinic.  Patient noted to be hypertensive in ED, likely secondary to her pain.  We will have patient follow-up with primary care provider outpatient setting for reassessment of her blood pressure.  Discussed results, findings, treatment and follow up. Patient advised of return precautions. Patient verbalized understanding and agreed with plan.      Berneice Heinrich 12/25/20 0902    Paula Libra, MD 12/25/20 2230

## 2020-12-25 NOTE — ED Provider Notes (Signed)
[facility]-EMERGENCY DEPT Provider Note   CSN: [id number] Arrival date & time: 12/25/20  0121     History Chief Complaint  Patient presents with   Pelvic Pain    Tonya Weeks is a 30 y.o. female with past medical history significant for HIV, pelvic floor dysfunction, chronic interstitial cystitis followed by urogynecology as well as pain management who presents for evaluation of pelvic pain.  States her pelvis is "spasming."  States when this happens her lower body "locks up."  She tried inserting her Valium vaginal suppository however was unable to do this due to her pain.  She states this began approximately 5 days ago after her medications were changed.  She takes chronic opioids as well.  No fever, chills, nausea, vomiting, chest pain, paresthesias. Feels weak (due to the pain.) States unable to walk up right due to the pain. Denies additional aggravating or alleviating factors.  Symptoms consistent with her prior exacerbations.  No urinary complaints. Rates her pain a 10/10. Compliant with HIV meds.  History obtained from patient and past medical records.  No interpreter is used.  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   Depression 09/05/2012   Syphilis contact, treated 01/07/2012   HIV disease (HCC) 09/01/2011    Past Surgical History:  Procedure  Laterality Date   DILATION AND CURETTAGE, DIAGNOSTIC / THERAPEUTIC       OB History     Gravida  3   Para  2   Term  2   Preterm      AB  1   Living  2      SAB      IAB  1   Ectopic      Multiple  0   Live Births  2           Family History  Problem Relation Age of Onset   Lupus Mother    Hypertension Mother    Heart failure Mother    Heart attack Mother    Sickle cell trait Mother    Scleroderma Mother    Hypertension Father    Arthritis Maternal Grandmother    Diabetes Maternal Grandmother    Hypertension Maternal Grandmother    Stroke Maternal Grandmother    Heart failure Maternal Grandmother    Heart attack Maternal Grandmother    Asthma Maternal Grandmother    Arthritis Maternal Grandfather    Diabetes Maternal Grandfather    Hypertension Maternal Grandfather    Stroke Maternal Grandfather    Heart failure Maternal Grandfather    Heart attack Maternal Grandfather    Kidney disease Maternal Grandfather    Other Neg Hx     Social History   Tobacco Use   Smoking status: Never   Smokeless tobacco: Never  Vaping Use  Vaping Use: Never used  Substance Use Topics   Alcohol use: Yes    Comment: Socially    Drug use: No    Home Medications Prior to Admission medications   Medication Sig Start Date End Date Taking? Authorizing Provider  acetaminophen (TYLENOL) 500 MG tablet Take 1,000 mg by mouth every 6 (six) hours as needed for moderate pain.   Yes [provider]  DESCOVY 200-25 MG tablet Take 1 tablet by mouth daily. 06/14/17  Yes [provider]  ELMIRON 100 MG capsule Take 100 mg by mouth 3 (three) times daily. 12/17/20  Yes [provider]  Emtricitabine-Tenofovir DF (TRUVADA PO) Take 1 tablet by mouth daily.   Yes [provider]  gabapentin (NEURONTIN) 300 MG capsule Take 600 mg by mouth 3 (three) times daily. 12/19/20  Yes [provider]  hydrOXYzine (ATARAX/VISTARIL) 25 MG tablet Take  1 tablet (25 mg total) by mouth nightly. 1-2 one hour before sleep 12/05/20  Yes [provider]  lidocaine (LMX) 4 % cream Apply 1 application topically as needed. Patient taking differently: Apply 1 application topically daily as needed (for bodyaches). 09/17/20  Yes Joy, Shawn C, PA-C  naproxen sodium (ALEVE) 220 MG tablet Take 440 mg by mouth daily as needed (pain).   Yes [provider]  oxyCODONE-acetaminophen (PERCOCET/ROXICET) 5-325 MG tablet Take 1 tablet by mouth every 6 (six) hours as needed for severe pain. 09/17/20  Yes Joy, Shawn C, PA-C  PRESCRIPTION MEDICATION Diazepam vaginal suppository: Insert suppository into the vagina at bedtime for severe pain   Yes [provider]  tiZANidine (ZANAFLEX) 4 MG tablet Take 4 mg by mouth 3 (three) times daily. 12/17/20  Yes [provider]  phenazopyridine (PYRIDIUM) 200 MG tablet Take 1 tablet (200 mg total) by mouth 3 (three) times daily. Patient not taking: Reported on 12/25/2020 09/20/20   Elpidio Anis, PA-C    Allergies    Patient has no known allergies.  Review of Systems   Review of Systems  Constitutional: Negative.   HENT: Negative.    Respiratory: Negative.    Cardiovascular: Negative.   Gastrointestinal: Negative.   Genitourinary:  Positive for pelvic pain. Negative for decreased urine volume, difficulty urinating, dysuria, enuresis, flank pain, frequency, genital sores, hematuria, menstrual problem, urgency, vaginal bleeding, vaginal discharge and vaginal pain.  Musculoskeletal:  Positive for back pain. Negative for arthralgias, gait problem, joint swelling, myalgias, neck pain and neck stiffness.  Skin: Negative.   Neurological: Negative.   All other systems reviewed and are negative.  Physical Exam Updated Vital Signs BP (!) 136/91   Pulse 76   Temp 98 F (36.7 C) (Oral)   Resp 17   SpO2 97%   Physical Exam Vitals and nursing note reviewed.  Constitutional:      General: She is  not in acute distress.    Appearance: She is well-developed. She is not ill-appearing, toxic-appearing or diaphoretic.  HENT:     Head: Normocephalic and atraumatic.     Nose: Nose normal.     Mouth/Throat:     Mouth: Mucous membranes are moist.  Eyes:     Pupils: Pupils are equal, round, and reactive to light.  Cardiovascular:     Rate and Rhythm: Normal rate.     Pulses: Normal pulses.     Heart sounds: Normal heart sounds.  Pulmonary:     Effort: Pulmonary effort is normal. No respiratory distress.     Breath sounds: Normal breath sounds.  Abdominal:     General: Bowel sounds are normal. There is no distension.     Palpations: Abdomen is soft.     Tenderness: There is abdominal tenderness (Diffuse lower abdomen). There is no guarding or rebound.  Genitourinary:    Comments: DECLINED Musculoskeletal:        General: No swelling, tenderness, deformity or signs of injury. Normal range of motion.     Cervical back: Normal range of motion.     Right lower leg: No edema.     Left lower leg: No edema.  Skin:    General: Skin is warm and dry.     Capillary Refill: Capillary refill takes less than 2 seconds.  Neurological:     General: No focal deficit present.     Mental Status: She is alert and oriented to person, place, and time.  Psychiatric:        Mood and Affect: Mood normal.    ED Results / Procedures / Treatments   Labs (all labs ordered are listed, but only abnormal results are displayed) Labs Reviewed  CBC WITH DIFFERENTIAL/PLATELET  BASIC METABOLIC PANEL  URINALYSIS, ROUTINE W REFLEX MICROSCOPIC  I-STAT BETA HCG BLOOD, ED ([facility], [facility], [facility] ONLY)    EKG None  Radiology No results found.  Procedures Procedures   Medications Ordered in ED Medications  sodium chloride 0.9 % bolus 1,000 mL (has no administration in time range)  HYDROmorphone (DILAUDID) injection 1 mg (has no administration in time range)   ED Course  I have reviewed the triage vital signs and  the nursing notes.  Pertinent labs & imaging results that were available during my care of the patient were reviewed by me and considered in my medical decision making (see chart for details).  Patient here evaluation of pelvic pain.  History of chronic pelvic pain, interstitial cystitis.  She is afebrile, nonseptic, not ill-appearing.  Pain began earlier this week after having her pain management medications changed.  She declines any GU exam or ultrasound at this time.  She is exacerbation of her chronic pain.  She is on chronic opioids at home.  No concerns for any STDs.  Does state she feels weak however she describes this as "worsening pain."  Has a nonfocal neuro exam without deficits.  Limited abdominal exam due to patient cooperation.  We will check some labs, give IV fluids, pain management and reassess.  Suspect acute on chronic pain.  Care transferred to oncoming provider who will follow up on labs and disposition.     MDM Rules/Calculators/A&P                            Final Clinical Impression(s) / ED Diagnoses Final diagnoses:  Pelvic pain in female    Rx / DC Orders ED Discharge Orders     None        Remington Skalsky A, PA-C 12/25/20 0615    Molpus, Jonny Ruiz, MD 12/25/20 [phone]

## 2020-12-25 NOTE — Discharge Instructions (Addendum)
You came to the emergency department today to be evaluated for your pelvic pain and spasms.  Your lab work was reassuring.  Your pain is likely due to your interstitial cystitis and pelvic floor dysfunction.  Please follow-up with your urologist Dr. Logan Bores and pain clinic.  Please take all your prescribed medications.  While in the emergency department your blood pressure was found to be elevated.  This may be due to the pain you are experiencing.  Please follow-up with your primary care provider in the outpatient setting to have your blood pressure reassessed.  Get help right away if: You have sudden severe pain. Your pain gets steadily worse. You have severe pain along with fever, nausea, vomiting, or excessive sweating. You lose consciousness.

## 2020-12-25 NOTE — ED Notes (Signed)
Pt stated that she is refusing pelvic exam. States that only her specialist can do pelvic exams on her.

## 2020-12-25 NOTE — ED Notes (Signed)
I gave patient a cup of water per nurse 

## 2020-12-25 NOTE — ED Notes (Signed)
Pt sitting upright on bedside commode eating Sweet Tarts.

## 2020-12-25 NOTE — ED Notes (Signed)
This nurse entered the pt's exam room to find the pt sitting on the bedside commode. Will medicate pt after pt is back in ED stretcher.

## 2020-12-25 NOTE — ED Notes (Signed)
Istat beta <5.0

## 2020-12-26 ENCOUNTER — Telehealth: Payer: Self-pay

## 2020-12-26 LAB — I-STAT BETA HCG BLOOD, ED (MC, WL, AP ONLY): I-stat hCG, quantitative: <5 m[IU]/mL (ref <5)

## 2020-12-26 NOTE — Telephone Encounter (Signed)
Transition Care Management Unsuccessful Follow-up Telephone Call  Date of discharge and from where:  12/25/2020-[facility]   Attempts:  1st Attempt  Reason for unsuccessful TCM follow-up call:  Left voice message    

## 2020-12-30 NOTE — Telephone Encounter (Signed)
Transition Care Management Follow-up Telephone Call Date of discharge and from where: 12/25/2020 from [facility] ED How have you been since you were released from the hospital? Pt stated that she is still experiencing pelvic pain. Pt does have an appt to see urology.  Any questions or concerns? No  Items Reviewed: Did the pt receive and understand the discharge instructions provided? Yes  Medications obtained and verified? Yes  Other? No  Any new allergies since your discharge? No  Dietary orders reviewed? No Do you have support at home? Yes   Functional Questionnaire: (I = Independent and D = Dependent) ADLs: I  Bathing/Dressing- I  Meal Prep- I  Eating- I  Maintaining continence- I  Transferring/Ambulation- I  Managing Meds- I   Follow up appointments reviewed:  PCP Hospital f/u appt confirmed? No   Specialist Hospital f/u appt confirmed? Yes  Scheduled to see Urology on end of October Are transportation arrangements needed? No  If their condition worsens, is the pt aware to call PCP or go to the Emergency Dept.? Yes Was the patient provided with contact information for the PCP's office or ED? Yes Was to pt encouraged to call back with questions or concerns? Yes

## 2021-01-02 ENCOUNTER — Ambulatory Visit: Admit: 2021-01-02 | Payer: BLUE CROSS/BLUE SHIELD | Attending: Family Medicine | Primary: Family Medicine

## 2021-01-15 ENCOUNTER — Other Ambulatory Visit: Payer: Self-pay

## 2021-01-15 ENCOUNTER — Encounter (HOSPITAL_COMMUNITY): Payer: Self-pay | Admitting: Emergency Medicine

## 2021-01-15 ENCOUNTER — Emergency Department (HOSPITAL_COMMUNITY)
Admission: EM | Admit: 2021-01-15 | Discharge: 2021-01-16 | Disposition: A | Discharge status: Home / Self Care | Payer: Medicaid Other | Attending: Emergency Medicine | Admitting: Emergency Medicine

## 2021-01-15 DIAGNOSIS — Z79899 Other long term (current) drug therapy: Secondary | ICD-10-CM | POA: Insufficient documentation

## 2021-01-15 DIAGNOSIS — R102 Pelvic and perineal pain: Secondary | ICD-10-CM | POA: Insufficient documentation

## 2021-01-15 DIAGNOSIS — B370 Candidal stomatitis: Secondary | ICD-10-CM

## 2021-01-15 DIAGNOSIS — B3731 Acute candidiasis of vulva and vagina: Secondary | ICD-10-CM | POA: Diagnosis not present

## 2021-01-15 DIAGNOSIS — K649 Unspecified hemorrhoids: Secondary | ICD-10-CM | POA: Diagnosis not present

## 2021-01-15 DIAGNOSIS — B379 Candidiasis, unspecified: Secondary | ICD-10-CM | POA: Diagnosis not present

## 2021-01-15 DIAGNOSIS — G894 Chronic pain syndrome: Secondary | ICD-10-CM | POA: Insufficient documentation

## 2021-01-15 DIAGNOSIS — M62838 Other muscle spasm: Secondary | ICD-10-CM | POA: Insufficient documentation

## 2021-01-15 LAB — URINALYSIS, ROUTINE W REFLEX MICROSCOPIC
Bilirubin Urine: NEGATIVE
Glucose, UA: NEGATIVE mg/dL
Ketones, ur: NEGATIVE mg/dL
Nitrite: NEGATIVE
Protein, ur: NEGATIVE mg/dL
Specific Gravity, Urine: 1.013 (ref 1.005–1.030)
WBC, UA: >50 WBC/hpf — ABNORMAL HIGH (ref 0–5)
pH: 7 (ref 5.0–8.0)

## 2021-01-15 LAB — POC URINE PREG, ED: Preg Test, Ur: NEGATIVE

## 2021-01-15 MED ORDER — ONDANSETRON 4 MG PO TBDP
4.0000 mg | ORAL_TABLET | Freq: Once | ORAL | Status: AC
  Administered 2021-01-15: 4 mg via ORAL
  Filled 2021-01-15: qty 1

## 2021-01-15 MED ORDER — HYDROCORTISONE ACETATE 25 MG RE SUPP: 25.0000 mg | Freq: Two times a day (BID) | RECTAL | 0 refills | Status: DC

## 2021-01-15 MED ORDER — HYDROMORPHONE HCL 1 MG/ML IJ SOLN
1.0000 mg | Freq: Once | INTRAMUSCULAR | Status: AC
  Administered 2021-01-15: 1 mg via INTRAMUSCULAR
  Filled 2021-01-15: qty 1

## 2021-01-15 MED ORDER — HYDROMORPHONE HCL 1 MG/ML IJ SOLN
1.0000 mg | Freq: Once | INTRAMUSCULAR | Status: DC
  Filled 2021-01-15: qty 1

## 2021-01-15 MED ORDER — HYDROMORPHONE HCL 1 MG/ML IJ SOLN
1.0000 mg | Freq: Once | INTRAMUSCULAR | Status: AC
Start: 2021-01-15 — End: 2021-01-15
  Administered 2021-01-15: 1 mg via INTRAVENOUS

## 2021-01-15 MED ORDER — CYCLOBENZAPRINE HCL 10 MG PO TABS
5.0000 mg | ORAL_TABLET | Freq: Once | ORAL | Status: AC
  Administered 2021-01-15: 5 mg via ORAL
  Filled 2021-01-15: qty 1

## 2021-01-15 MED ORDER — NYSTATIN 100000 UNIT/ML MT SUSP: 500000.0000 [IU] | Freq: Four times a day (QID) | OROMUCOSAL | 0 refills | Status: DC

## 2021-01-15 MED ORDER — HYDROCORTISONE ACETATE 25 MG RE SUPP
25.0000 mg | Freq: Once | RECTAL | Status: AC
  Administered 2021-01-15: 25 mg via RECTAL
  Filled 2021-01-15: qty 1

## 2021-01-15 NOTE — ED Triage Notes (Signed)
Patient arrives complaining of a pain flare up. Patient states hx of pelvic floor dysfunction and interstitial cystitis. Patient states that she has been seeing specialists, and they are unsure yet of what triggers the pain. Patient states frequent urination. Patient states tested for UTI multiple times, has been negative.

## 2021-01-15 NOTE — Discharge Instructions (Addendum)
Follow-up with your doctor regarding your pelvic pain. Return to the emergency room any new, worsening, concerning symptoms.  Use the nystatin suspension for your mouth and tongue pain to treat the thrush.  Use the anusol for hemorrhoids.   Your urine has been sent to grow out.  If there is bacteria, you will receive a phone call and antibiotics will be called in for you.

## 2021-01-15 NOTE — ED Provider Notes (Signed)
[facility]-EMERGENCY DEPT Provider Note   CSN: [id number] Arrival date & time: 01/15/21  1943     History Chief Complaint  Patient presents with   Pelvic Pain    Tonya Weeks is a 30 y.o. female presenting for evaluation of pelvic pain.  Patient states she is having a flare of her interstitial cystitis, having acute on chronic pelvic pain.  This began this afternoon.  She has been taking her home medication as prescribed without improvement of symptoms.  She reports severe spasm pain in her pelvis as well as the urge to urinate frequently.  She also reports hemorrhoids which are causing pain.  She states the symptoms are consistent with her normal flare, nothing feels different.  No fevers or chills.  No nausea or vomiting.  No dysuria or hematuria.  No abnormal bowel movements.  She is working with specialist to try and determine the cause of her symptoms and best course of treatment, but has not found a regimen that works well for her.  Additional history obtained from chart review.  History of anemia, HIV, pelvic floor dysfunction.   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   Depression 09/05/2012   Syphilis contact, treated 01/07/2012   HIV disease (HCC) 09/01/2011    Past Surgical History:  Procedure Laterality Date   DILATION AND CURETTAGE, DIAGNOSTIC  / THERAPEUTIC       OB History     Gravida  3   Para  2   Term  2   Preterm      AB  1   Living  2      SAB      IAB  1   Ectopic      Multiple  0   Live Births  2           Family History  Problem Relation Age of Onset   Lupus Mother    Hypertension Mother    Heart failure Mother    Heart attack Mother    Sickle cell trait Mother    Scleroderma Mother    Hypertension Father    Arthritis Maternal Grandmother    Diabetes Maternal Grandmother    Hypertension Maternal Grandmother    Stroke Maternal Grandmother    Heart failure Maternal Grandmother    Heart attack Maternal Grandmother    Asthma Maternal Grandmother    Arthritis Maternal Grandfather    Diabetes Maternal Grandfather    Hypertension Maternal Grandfather    Stroke Maternal Grandfather    Heart failure Maternal Grandfather    Heart attack Maternal Grandfather    Kidney disease Maternal Grandfather    Other Neg Hx     Social History   Tobacco Use   Smoking status: Never   Smokeless tobacco: Never  Vaping Use  Vaping Use: Never used  Substance Use Topics   Alcohol use: Yes    Comment: Socially    Drug use: No    Home Medications Prior to Admission medications   Medication Sig Start Date End Date Taking? Authorizing Provider  DESCOVY 200-25 MG tablet Take 1 tablet by mouth daily. 06/14/17  Yes [provider]  ELMIRON 100 MG capsule Take 100 mg by mouth 3 (three) times daily. 12/17/20  Yes [provider]  Emtricitabine-Tenofovir DF (TRUVADA PO) Take 1 tablet by mouth daily.   Yes [provider]  gabapentin (NEURONTIN) 300 MG capsule Take 600 mg by mouth 3 (three) times daily. 12/19/20  Yes [provider]  hydrocortisone (ANUSOL-HC) 25 MG suppository Place 1 suppository (25 mg total) rectally 2 (two) times daily. 01/15/21  Yes Keerstin Bjelland, PA-C  hydrOXYzine (ATARAX/VISTARIL) 25 MG tablet Take 1 tablet (25 mg total) by mouth nightly. 1-2 one  hour before sleep 12/05/20  Yes [provider]  lidocaine (LMX) 4 % cream Apply 1 application topically as needed. Patient taking differently: Apply 1 application topically daily as needed (for bodyaches). 09/17/20  Yes Joy, Shawn C, PA-C  naproxen sodium (ALEVE) 220 MG tablet Take 440 mg by mouth daily as needed (pain).   Yes [provider]  nystatin (MYCOSTATIN) 100000 UNIT/ML suspension Take 5 mLs (500,000 Units total) by mouth 4 (four) times daily. 01/15/21  Yes Jalaysha Skilton, PA-C  ondansetron (ZOFRAN ODT) 4 MG disintegrating tablet Take 1 tablet (4 mg total) by mouth every 8 (eight) hours as needed for nausea or vomiting. 12/25/20  Yes Haskel Schroeder, PA-C  oxyCODONE-acetaminophen (PERCOCET/ROXICET) 5-325 MG tablet Take 1 tablet by mouth every 6 (six) hours as needed for severe pain. 09/17/20  Yes Joy, Shawn C, PA-C  PRESCRIPTION MEDICATION Diazepam vaginal suppository: Insert suppository into the vagina at bedtime as needed for severe pain   Yes [provider]  tiZANidine (ZANAFLEX) 4 MG tablet Take 4 mg by mouth 3 (three) times daily. 12/17/20  Yes [provider]  phenazopyridine (PYRIDIUM) 200 MG tablet Take 1 tablet (200 mg total) by mouth 3 (three) times daily. Patient not taking: No sig reported 09/20/20   Elpidio Anis, PA-C    Allergies    Patient has no known allergies.  Review of Systems   Review of Systems  Gastrointestinal:  Positive for abdominal pain.  Genitourinary:  Positive for pelvic pain.  All other systems reviewed and are negative.  Physical Exam Updated Vital Signs BP (!) 154/98 (BP Location: Left Arm)   Pulse 96   Temp 98.3 F (36.8 C) (Oral)   Resp 18   Ht 5\' 3"  (1.6 m)   Wt 72.6 kg   SpO2 100%   BMI 28.34 kg/m   Physical Exam Vitals and nursing note reviewed.  Constitutional:      General: She is not in acute distress.    Appearance: Normal appearance.  HENT:     Head: Normocephalic and atraumatic.   Eyes:     Conjunctiva/sclera: Conjunctivae normal.     Pupils: Pupils are equal, round, and reactive to light.  Cardiovascular:     Rate and Rhythm: Normal rate and regular rhythm.     Pulses: Normal pulses.  Pulmonary:     Effort: Pulmonary effort is normal. No respiratory distress.     Breath sounds: Normal breath sounds. No wheezing.     Comments: Speaking in full sentences.  Clear lung sounds in all fields. Abdominal:  General: There is no distension.     Palpations: Abdomen is soft. There is no mass.     Tenderness: There is abdominal tenderness. There is no guarding or rebound.     Comments: Diffuse tenderness palpation of her lower abdomen.  No rigidity or distention.  No rebound.  No tenderness palpation of the back or CVA.  Genitourinary:    Comments: declined Musculoskeletal:        General: Normal range of motion.     Cervical back: Normal range of motion and neck supple.  Skin:    General: Skin is warm and dry.     Capillary Refill: Capillary refill takes less than 2 seconds.  Neurological:     Mental Status: She is alert and oriented to person, place, and time.  Psychiatric:        Mood and Affect: Mood and affect normal.        Speech: Speech normal.        Behavior: Behavior normal.    ED Results / Procedures / Treatments   Labs (all labs ordered are listed, but only abnormal results are displayed) Labs Reviewed  URINALYSIS, ROUTINE W REFLEX MICROSCOPIC - Abnormal; Notable for the following components:      Result Value   APPearance CLOUDY (*)    Hgb urine dipstick SMALL (*)    Leukocytes,Ua LARGE (*)    WBC, UA >50 (*)    Bacteria, UA RARE (*)    All other components within normal limits  POC URINE PREG, ED    EKG None  Radiology No results found.  Procedures Procedures   Medications Ordered in ED Medications  ondansetron (ZOFRAN-ODT) disintegrating tablet 4 mg (4 mg Oral Given 01/15/21 2127)  HYDROmorphone (DILAUDID) injection 1 mg (1 mg  Intramuscular Given 01/15/21 2127)  cyclobenzaprine (FLEXERIL) tablet 5 mg (5 mg Oral Given 01/15/21 2307)  HYDROmorphone (DILAUDID) injection 1 mg (1 mg Intravenous Given 01/15/21 2342)  hydrocortisone (ANUSOL-HC) suppository 25 mg (25 mg Rectal Given 01/15/21 2342)    ED Course  I have reviewed the triage vital signs and the nursing notes.  Pertinent labs & imaging results that were available during my care of the patient were reviewed by me and considered in my medical decision making (see chart for details).    MDM Rules/Calculators/A&P                           Patient presenting for evaluation of acute on chronic pelvic pain.  On exam, patient appears uncomfortable, but otherwise nontoxic.  She does have diffuse tenderness palpation of the lower abdomen.  Offered labs, patient declined as this is consistent with her normal flare.  Will check urine and pregnancy to ensure no abnormality.  Will treat symptomatically and reassess.  On reassessment after first round of medication, patient reports she still in severe pain.  We will give a a 2nd round of tx. At this time she also reports she is having pain and an abnormal appearance of her tongue.  On evaluation, it is most consistent with thrush.  Discussed treatment with nystatin suspension.  Discussed that she will get another round of pain medication, but after that she will need to go home and follow-up with her doctor for further pain management.  At this time, patient appears safe for discharge.  Return precautions given.  Patient states she understands and agrees to plan.   Final Clinical Impression(s) / ED Diagnoses Final  diagnoses:  Pelvic pain in female  Thrush    Rx / DC Orders ED Discharge Orders          Ordered    hydrocortisone (ANUSOL-HC) 25 MG suppository  2 times daily        01/15/21 2319    nystatin (MYCOSTATIN) 100000 UNIT/ML suspension  4 times daily        01/15/21 2319             Johnmichael Melhorn,  PA-C 01/15/21 2358    Pricilla Loveless, MD 01/20/21 [phone]

## 2021-01-17 ENCOUNTER — Telehealth: Payer: Self-pay

## 2021-01-17 NOTE — Telephone Encounter (Signed)
Transition Care Management Follow-up Telephone Call Date of discharge and from where: 01/16/2021-[place]  How have you been since you were released from the hospital? Patient stated she is doing fine.  Any questions or concerns? No  Items Reviewed: Did the pt receive and understand the discharge instructions provided? Yes  Medications obtained and verified? Yes  Other? No  Any new allergies since your discharge? No  Dietary orders reviewed? No Do you have support at home? Yes   Home Care and Equipment/Supplies: Were home health services ordered? not applicable If so, what is the name of the agency? N/A  Has the agency set up a time to come to the patient's home? not applicable Were any new equipment or medical supplies ordered?  No What is the name of the medical supply agency? N/A Were you able to get the supplies/equipment? not applicable Do you have any questions related to the use of the equipment or supplies? No  Functional Questionnaire: (I = Independent and D = Dependent) ADLs: I  Bathing/Dressing- I  Meal Prep- I  Eating- I  Maintaining continence- I  Transferring/Ambulation- I  Managing Meds- I  Follow up appointments reviewed:  PCP Hospital f/u appt confirmed? No  Specialist Hospital f/u appt confirmed? No  Are transportation arrangements needed? No  If their condition worsens, is the pt aware to call PCP or go to the Emergency Dept.? Yes Was the patient provided with contact information for the PCP's office or ED? Yes Was to pt encouraged to call back with questions or concerns? Yes

## 2021-01-19 ENCOUNTER — Encounter (HOSPITAL_COMMUNITY): Payer: Self-pay | Admitting: Emergency Medicine

## 2021-01-19 ENCOUNTER — Emergency Department (HOSPITAL_COMMUNITY)
Admission: EM | Admit: 2021-01-19 | Discharge: 2021-01-19 | Disposition: A | Discharge status: Home / Self Care | Payer: Medicaid Other | Attending: Emergency Medicine | Admitting: Emergency Medicine

## 2021-01-19 ENCOUNTER — Other Ambulatory Visit: Payer: Self-pay

## 2021-01-19 DIAGNOSIS — Z21 Asymptomatic human immunodeficiency virus [HIV] infection status: Secondary | ICD-10-CM | POA: Diagnosis not present

## 2021-01-19 DIAGNOSIS — R102 Pelvic and perineal pain: Secondary | ICD-10-CM

## 2021-01-19 DIAGNOSIS — R7989 Other specified abnormal findings of blood chemistry: Secondary | ICD-10-CM

## 2021-01-19 DIAGNOSIS — R1030 Lower abdominal pain, unspecified: Secondary | ICD-10-CM | POA: Diagnosis present

## 2021-01-19 DIAGNOSIS — R7401 Elevation of levels of liver transaminase levels: Secondary | ICD-10-CM | POA: Diagnosis not present

## 2021-01-19 LAB — CBC WITH DIFFERENTIAL/PLATELET
Abs Immature Granulocytes: 0.02 10*3/uL (ref 0.00–0.07)
Basophils Absolute: 0 10*3/uL (ref 0.0–0.1)
Basophils Relative: 0 %
Eosinophils Absolute: 0.2 10*3/uL (ref 0.0–0.5)
Eosinophils Relative: 2 %
HCT: 29.7 % — ABNORMAL LOW (ref 36.0–46.0)
Hemoglobin: 9.1 g/dL — ABNORMAL LOW (ref 12.0–15.0)
Immature Granulocytes: 0 %
Lymphocytes Relative: 53 %
Lymphs Abs: 3.8 10*3/uL (ref 0.7–4.0)
MCH: 22.1 pg — ABNORMAL LOW (ref 26.0–34.0)
MCHC: 30.6 g/dL (ref 30.0–36.0)
MCV: 72.1 fL — ABNORMAL LOW (ref 80.0–100.0)
Monocytes Absolute: 0.4 10*3/uL (ref 0.1–1.0)
Monocytes Relative: 5 %
Neutro Abs: 2.9 10*3/uL (ref 1.7–7.7)
Neutrophils Relative %: 40 %
Platelets: 209 10*3/uL (ref 150–400)
RBC: 4.12 MIL/uL (ref 3.87–5.11)
RDW: 18.3 % — ABNORMAL HIGH (ref 11.5–15.5)
WBC: 7.2 10*3/uL (ref 4.0–10.5)
nRBC: 0 % (ref 0.0–0.2)

## 2021-01-19 LAB — URINALYSIS, ROUTINE W REFLEX MICROSCOPIC
Bacteria, UA: NONE SEEN
Bilirubin Urine: NEGATIVE
Glucose, UA: NEGATIVE mg/dL
Ketones, ur: NEGATIVE mg/dL
Nitrite: NEGATIVE
Protein, ur: 30 mg/dL — AB
RBC / HPF: >50 RBC/hpf — ABNORMAL HIGH (ref 0–5)
Specific Gravity, Urine: 1.015 (ref 1.005–1.030)
pH: 6 (ref 5.0–8.0)

## 2021-01-19 LAB — COMPREHENSIVE METABOLIC PANEL
ALT: 150 U/L — ABNORMAL HIGH (ref 0–44)
AST: 157 U/L — ABNORMAL HIGH (ref 15–41)
Albumin: 3.8 g/dL (ref 3.5–5.0)
Alkaline Phosphatase: 106 U/L (ref 38–126)
Anion gap: 5 (ref 5–15)
BUN: 9 mg/dL (ref 6–20)
CO2: 25 mmol/L (ref 22–32)
Calcium: 9.1 mg/dL (ref 8.9–10.3)
Chloride: 105 mmol/L (ref 98–111)
Creatinine, Ser: 0.8 mg/dL (ref 0.44–1.00)
GFR, Estimated: >60 mL/min (ref >60)
Glucose, Bld: 99 mg/dL (ref 70–99)
Potassium: 3.9 mmol/L (ref 3.5–5.1)
Sodium: 135 mmol/L (ref 135–145)
Total Bilirubin: 0.4 mg/dL (ref 0.3–1.2)
Total Protein: 8.5 g/dL — ABNORMAL HIGH (ref 6.5–8.1)

## 2021-01-19 LAB — I-STAT BETA HCG BLOOD, ED (MC, WL, AP ONLY): I-stat hCG, quantitative: <5 m[IU]/mL (ref <5)

## 2021-01-19 MED ORDER — OXYCODONE HCL 5 MG PO TABS: 5.0000 mg | ORAL_TABLET | ORAL | 0 refills | Status: DC | PRN

## 2021-01-19 MED ORDER — HYDROMORPHONE HCL 1 MG/ML IJ SOLN
1.0000 mg | Freq: Once | INTRAMUSCULAR | Status: AC
Start: 2021-01-19 — End: 2021-01-19
  Administered 2021-01-19: 1 mg via INTRAVENOUS
  Filled 2021-01-19: qty 1

## 2021-01-19 MED ORDER — HYDROMORPHONE HCL 1 MG/ML IJ SOLN
1.0000 mg | Freq: Once | INTRAMUSCULAR | Status: AC
  Administered 2021-01-19: 1 mg via INTRAVENOUS
  Filled 2021-01-19: qty 1

## 2021-01-19 MED ORDER — DIAZEPAM 5 MG/ML IJ SOLN
5.0000 mg | Freq: Once | INTRAMUSCULAR | Status: AC
  Administered 2021-01-19: 5 mg via INTRAVENOUS
  Filled 2021-01-19: qty 2

## 2021-01-19 MED ORDER — ONDANSETRON HCL 4 MG/2ML IJ SOLN
4.0000 mg | Freq: Once | INTRAMUSCULAR | Status: AC
  Administered 2021-01-19: 4 mg via INTRAVENOUS
  Filled 2021-01-19: qty 2

## 2021-01-19 MED ORDER — SODIUM CHLORIDE 0.9 % IV BOLUS
1000.0000 mL | Freq: Once | INTRAVENOUS | Status: AC
  Administered 2021-01-19: 1000 mL via INTRAVENOUS

## 2021-01-19 MED ORDER — OXYCODONE-ACETAMINOPHEN 5-325 MG PO TABS
1.0000 | ORAL_TABLET | Freq: Once | ORAL | Status: AC
  Administered 2021-01-19: 2 via ORAL
  Filled 2021-01-19: qty 2

## 2021-01-19 NOTE — ED Provider Notes (Signed)
Emergency Medicine Provider Triage Evaluation Note  Tonya Weeks , a 30 y.o. female  was evaluated in triage.  Pt complains of chronic and ongoing suprapubic abdominal pain has been acutely worsen over the last few days.  Patient has known interstitial cystitis and has a cystoscopy scheduled for Thursday.  She is on chronic pain medication from pain management but ran out of pills.  Here for pain control.  No new associated symptoms.  Review of Systems  Positive:  Negative: Urinary symptoms  Physical Exam  BP (!) 154/114 (BP Location: Left Arm)   Pulse (!) 118   Temp 98.4 F (36.9 C) (Oral)   Resp 20   LMP 01/19/2021 (Approximate)   SpO2 100%  Gen:   Awake, no distress   Resp:  Normal effort  MSK:   Moves extremities without difficulty  Other:  Suprapubic abdominal tenderness  Medical Decision Making  Medically screening exam initiated at 5:55 PM.  Appropriate orders placed.  Tonya Weeks was informed that the remainder of the evaluation will be completed by another provider, this initial triage assessment does not replace that evaluation, and the importance of remaining in the ED until their evaluation is complete.     Teressa Lower, PA-C 01/19/21 1756    Sloan Leiter, DO 01/19/21 2332

## 2021-01-19 NOTE — ED Triage Notes (Signed)
Patient complains of inersitial cysitis and pelvic floor dysfunction. Reports pain everywhere down below.'

## 2021-01-19 NOTE — ED Provider Notes (Signed)
[facility]-EMERGENCY DEPT Provider Note   CSN: [id number] Arrival date & time: 01/19/21  1716     History Chief Complaint  Patient presents with   Pelvic Pain    Tonya Weeks is a 30 y.o. female history of HIV, interstitial cystitis here presenting with suprapubic pain. Patient states that she has been having flareup of her interstitial cystitis.  Patient was seen here several days ago for the same complaint.  Her UA showed possible UTI but she states that this is her baseline urinalysis so no antibiotics was prescribed.  Patient was scheduled for cystoscopy.  Patient was prescribed pain medicine but she states that she is still in severe pain and has bladder spasms.  Patient denies any pelvic pain or pelvic discharge or bleeding.  Patient states that she is here mainly for pain control.  The history is provided by the patient.      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   Depression 09/05/2012   Syphilis contact, treated 01/07/2012   HIV disease (HCC) 09/01/2011    Past Surgical History:  Procedure Laterality Date   DILATION AND CURETTAGE, DIAGNOSTIC / THERAPEUTIC       OB History     Gravida  3   Para  2   Term  2   Preterm      AB  1   Living  2      SAB      IAB  1   Ectopic      Multiple  0   Live Births  2           Family  History  Problem Relation Age of Onset   Lupus Mother    Hypertension Mother    Heart failure Mother    Heart attack Mother    Sickle cell trait Mother    Scleroderma Mother    Hypertension Father    Arthritis Maternal Grandmother    Diabetes Maternal Grandmother    Hypertension Maternal Grandmother    Stroke Maternal Grandmother    Heart failure Maternal Grandmother    Heart attack Maternal Grandmother    Asthma Maternal Grandmother    Arthritis Maternal Grandfather    Diabetes Maternal Grandfather    Hypertension Maternal Grandfather    Stroke Maternal Grandfather    Heart failure Maternal Grandfather    Heart attack Maternal Grandfather    Kidney disease Maternal Grandfather    Other Neg Hx     Social History   Tobacco Use   Smoking status: Never   Smokeless tobacco: Never  Vaping Use   Vaping Use: Never used  Substance Use Topics   Alcohol use: Yes    Comment: Socially    Drug use: No    Home Medications Prior to Admission medications   Medication Sig Start Date  End Date Taking? Authorizing Provider  DESCOVY 200-25 MG tablet Take 1 tablet by mouth daily. 06/14/17   [provider]  ELMIRON 100 MG capsule Take 100 mg by mouth 3 (three) times daily. 12/17/20   [provider]  Emtricitabine-Tenofovir DF (TRUVADA PO) Take 1 tablet by mouth daily.    [provider]  gabapentin (NEURONTIN) 300 MG capsule Take 600 mg by mouth 3 (three) times daily. 12/19/20   [provider]  hydrocortisone (ANUSOL-HC) 25 MG suppository Place 1 suppository (25 mg total) rectally 2 (two) times daily. 01/15/21   Caccavale, Sophia, PA-C  hydrOXYzine (ATARAX/VISTARIL) 25 MG tablet Take 1 tablet (25 mg total) by mouth nightly. 1-2 one hour before sleep 12/05/20   [provider]  lidocaine (LMX) 4 % cream Apply 1 application topically as needed. Patient taking differently: Apply 1 application topically daily as needed (for bodyaches). 09/17/20   Joy,  Shawn C, PA-C  naproxen sodium (ALEVE) 220 MG tablet Take 440 mg by mouth daily as needed (pain).    [provider]  nystatin (MYCOSTATIN) 100000 UNIT/ML suspension Take 5 mLs (500,000 Units total) by mouth 4 (four) times daily. 01/15/21   Caccavale, Sophia, PA-C  ondansetron (ZOFRAN ODT) 4 MG disintegrating tablet Take 1 tablet (4 mg total) by mouth every 8 (eight) hours as needed for nausea or vomiting. 12/25/20   Haskel Schroeder, PA-C  oxyCODONE-acetaminophen (PERCOCET/ROXICET) 5-325 MG tablet Take 1 tablet by mouth every 6 (six) hours as needed for severe pain. 09/17/20   Joy, Shawn C, PA-C  phenazopyridine (PYRIDIUM) 200 MG tablet Take 1 tablet (200 mg total) by mouth 3 (three) times daily. Patient not taking: No sig reported 09/20/20   Elpidio Anis, PA-C  PRESCRIPTION MEDICATION Diazepam vaginal suppository: Insert suppository into the vagina at bedtime as needed for severe pain    [provider]  tiZANidine (ZANAFLEX) 4 MG tablet Take 4 mg by mouth 3 (three) times daily. 12/17/20   [provider]    Allergies    Patient has no known allergies.  Review of Systems   Review of Systems  Genitourinary:  Positive for frequency.  All other systems reviewed and are negative.  Physical Exam Updated Vital Signs BP (!) 131/95   Pulse 97   Temp 98.4 F (36.9 C) (Oral)   Resp 18   LMP 01/19/2021 (Approximate)   SpO2 100%   Physical Exam Vitals and nursing note reviewed.  Constitutional:      Comments: Uncomfortable  HENT:     Head: Normocephalic.     Mouth/Throat:     Mouth: Mucous membranes are dry.  Eyes:     Extraocular Movements: Extraocular movements intact.     Pupils: Pupils are equal, round, and reactive to light.  Cardiovascular:     Rate and Rhythm: Normal rate and regular rhythm.     Pulses: Normal pulses.     Heart sounds: Normal heart sounds.  Pulmonary:     Effort: Pulmonary effort is normal.     Breath sounds: Normal breath  sounds.  Abdominal:     General: Abdomen is flat.     Palpations: Abdomen is soft.     Comments: + Suprapubic tenderness  Musculoskeletal:     Cervical back: Normal range of motion and neck supple.  Skin:    General: Skin is warm.     Capillary Refill: Capillary refill takes less than 2 seconds.  Neurological:     General: No focal deficit present.  Mental Status: She is oriented to person, place, and time.  Psychiatric:        Mood and Affect: Mood normal.        Behavior: Behavior normal.    ED Results / Procedures / Treatments   Labs (all labs ordered are listed, but only abnormal results are displayed) Labs Reviewed  URINALYSIS, ROUTINE W REFLEX MICROSCOPIC - Abnormal; Notable for the following components:      Result Value   APPearance CLOUDY (*)    Hgb urine dipstick LARGE (*)    Protein, ur 30 (*)    Leukocytes,Ua LARGE (*)    RBC / HPF >50 (*)    All other components within normal limits  CBC WITH DIFFERENTIAL/PLATELET - Abnormal; Notable for the following components:   Hemoglobin 9.1 (*)    HCT 29.7 (*)    MCV 72.1 (*)    MCH 22.1 (*)    RDW 18.3 (*)    All other components within normal limits  COMPREHENSIVE METABOLIC PANEL - Abnormal; Notable for the following components:   Total Protein 8.5 (*)    AST 157 (*)    ALT 150 (*)    All other components within normal limits  URINE CULTURE  I-STAT BETA HCG BLOOD, ED ([facility], [facility], [facility] ONLY)    EKG None  Radiology No results found.  Procedures Procedures   Medications Ordered in ED Medications  HYDROmorphone (DILAUDID) injection 1 mg (has no administration in time range)  oxyCODONE-acetaminophen (PERCOCET/ROXICET) 5-325 MG per tablet 1-2 tablet (2 tablets Oral Given 01/19/21 1803)  sodium chloride 0.9 % bolus 1,000 mL (1,000 mLs Intravenous New Bag/Given 01/19/21 2142)  HYDROmorphone (DILAUDID) injection 1 mg (1 mg Intravenous Given 01/19/21 2141)  diazepam (VALIUM) injection 5 mg (5 mg Intravenous Given  01/19/21 2201)  ondansetron (ZOFRAN) injection 4 mg (4 mg Intravenous Given 01/19/21 2141)    ED Course  I have reviewed the triage vital signs and the nursing notes.  Pertinent labs & imaging results that were available during my care of the patient were reviewed by me and considered in my medical decision making (see chart for details).    MDM Rules/Calculators/A&P                           Tonya Weeks is a 30 y.o. female here with suprapubic tenderness.  Patient has history of interstitial cystitis.  We will check urinalysis and will check chemistry and give pain medicine.    10:40 PM Labs showed elevated AST ALT.  Patient has been taking Percocet which contains oxycodone.  She has no right upper quadrant tenderness to suggest hepatitis or gallbladder pathology.  Urinalysis showed no bacteria in the urine.  Patient's pain under control now.  Stable for discharge.  Patient states that she has cystoscopy on Thursday.  We will give short course of oxycodone with no Tylenol   Final Clinical Impression(s) / ED Diagnoses Final diagnoses:  None    Rx / DC Orders ED Discharge Orders     None        Charlynne Pander, MD 01/19/21 2241

## 2021-01-19 NOTE — Discharge Instructions (Signed)
Take oxycodone for pain  Your liver enzymes are elevated so please avoid drinking alcohol or taking too much Tylenol  Please follow-up with your urologist at [facility]  Return to ER if you have worse abdominal pain, vomiting, fever

## 2021-01-20 ENCOUNTER — Telehealth: Payer: Self-pay

## 2021-01-20 NOTE — Telephone Encounter (Signed)
Transition Care Management Unsuccessful Follow-up Telephone Call  Date of discharge and from where:  01/19/2021 from [facility]  Attempts:  1st Attempt  Reason for unsuccessful TCM follow-up call:  Left voice message

## 2021-01-21 LAB — URINE CULTURE: Culture: NO GROWTH

## 2021-01-21 NOTE — Telephone Encounter (Signed)
Transition Care Management Unsuccessful Follow-up Telephone Call  Date of discharge and from where:  01/19/2021-[place]  Attempts:  2nd Attempt  Reason for unsuccessful TCM follow-up call:  Left voice message

## 2021-01-22 NOTE — Telephone Encounter (Signed)
Transition Care Management Unsuccessful Follow-up Telephone Call  Date of discharge and from where:  01/22/2021-[place]  Attempts:  3rd Attempt  Reason for unsuccessful TCM follow-up call:  Left voice message

## 2021-01-24 DIAGNOSIS — R000 Tachycardia, unspecified: Secondary | ICD-10-CM | POA: Diagnosis not present

## 2021-02-16 ENCOUNTER — Other Ambulatory Visit: Payer: Self-pay

## 2021-02-16 ENCOUNTER — Encounter: Payer: Self-pay | Admitting: Emergency Medicine

## 2021-02-16 ENCOUNTER — Ambulatory Visit
Admission: EM | Admit: 2021-02-16 | Discharge: 2021-02-16 | Disposition: A | Discharge status: Home / Self Care | Payer: Medicaid Other | Attending: Internal Medicine | Admitting: Internal Medicine

## 2021-02-16 ENCOUNTER — Ambulatory Visit
Admission: EM | Admit: 2021-02-16 | Discharge: 2021-02-16 | Disposition: A | Discharge status: Home / Self Care | Payer: Medicaid Other

## 2021-02-16 DIAGNOSIS — J029 Acute pharyngitis, unspecified: Secondary | ICD-10-CM | POA: Insufficient documentation

## 2021-02-16 DIAGNOSIS — Z20822 Contact with and (suspected) exposure to covid-19: Secondary | ICD-10-CM | POA: Diagnosis not present

## 2021-02-16 LAB — POCT RAPID STREP A (OFFICE): Rapid Strep A Screen: NEGATIVE

## 2021-02-16 MED ORDER — AMOXICILLIN 875 MG PO TABS: 875.0000 mg | ORAL_TABLET | Freq: Two times a day (BID) | ORAL | 0 refills | Status: AC

## 2021-02-16 NOTE — Discharge Instructions (Signed)
Your rapid strep test was negative but still suspicious of bacterial infection of the throat given the appearance of it.  Amoxicillin has been prescribed to help treat this.  Please follow-up with provided contact formation for ear, nose, throat doctor if pain persists.  COVID test is pending.  We will call if it is positive.

## 2021-02-16 NOTE — ED Provider Notes (Signed)
[facility]    CSN: [id number] Arrival date & time: 02/16/21  1229      History   Chief Complaint Chief Complaint  Patient presents with   Sore Throat    HPI Tonya Weeks is a 30 y.o. female.   Patient presents with sore throat and "Kindley patches to throat".  Sore throat has been present for approximately 3 weeks.  Patient reports that she had a urological procedure with intubation, and sore throat has been present since then.  She noticed [place] patches in the throat yesterday.  Also had a fever of 100 yesterday.  Patient also reports that her kids had had "sick symptoms" recently.  Denies chest pain, shortness of breath, nausea, vomiting, diarrhea, nasal congestion, cough.   Sore Throat   Past Medical History:  Diagnosis Date   Anemia    History of syphilis    Treated during last pregnancy    HIV infection (HCC)    HSV infection    Pelvic floor dysfunction    Sickle cell trait [facility])     Patient Active Problem List   Diagnosis Date Noted   Pregnancy 07/09/2017   Encounter for elective induction of labor 07/08/2017   Nausea & vomiting 07/05/2017   Pregnancy and infectious disease in third trimester 02/11/2017   Hypertension in pregnancy 04/15/2015   Encounter for long-term (current) use of other medications 10/12/2013   Health care maintenance 10/12/2013   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 Authorizing Provider  amoxicillin (AMOXIL) 875 MG tablet  Take 1 tablet (875 mg total) by mouth 2 (two) times daily for 10 days. 02/16/21 02/26/21 Yes , Rolly Salter E, FNP  DESCOVY 200-25 MG tablet Take 1 tablet by mouth daily. 06/14/17   [provider]  ELMIRON 100 MG capsule Take 100 mg by mouth 3 (three) times daily. 12/17/20   [provider]  Emtricitabine-Tenofovir DF (TRUVADA PO) Take 1 tablet by mouth daily.    [provider]  gabapentin (NEURONTIN) 300 MG capsule Take 600 mg by mouth 3 (three) times daily. 12/19/20   [provider]  hydrocortisone (ANUSOL-HC) 25 MG suppository Place 1 suppository (25 mg total) rectally 2 (two) times daily. 01/15/21   Caccavale, Sophia, PA-C  hydrOXYzine (ATARAX/VISTARIL) 25 MG tablet Take 1 tablet (25 mg total) by mouth nightly. 1-2 one hour before sleep 12/05/20   [provider]  lidocaine (LMX) 4 % cream Apply 1 application topically as needed. Patient taking differently: Apply 1 application topically daily as needed (for bodyaches). 09/17/20   Joy, Shawn C, PA-C  naproxen sodium (ALEVE) 220 MG tablet Take 440 mg by mouth daily as needed (pain).    [provider]  nystatin (MYCOSTATIN)  100000 UNIT/ML suspension Take 5 mLs (500,000 Units total) by mouth 4 (four) times daily. 01/15/21   Caccavale, Sophia, PA-C  ondansetron (ZOFRAN ODT) 4 MG disintegrating tablet Take 1 tablet (4 mg total) by mouth every 8 (eight) hours as needed for nausea or vomiting. 12/25/20   Haskel Schroeder, PA-C  oxyCODONE (ROXICODONE) 5 MG immediate release tablet Take 1 tablet (5 mg total) by mouth every 4 (four) hours as needed for severe pain. 01/19/21   Charlynne Pander, MD  oxyCODONE-acetaminophen (PERCOCET/ROXICET) 5-325 MG tablet Take 1 tablet by mouth every 6 (six) hours as needed for severe pain. 09/17/20   Joy, Shawn C, PA-C  phenazopyridine (PYRIDIUM) 200 MG tablet Take 1 tablet (200 mg total) by mouth 3 (three) times daily. Patient not taking: No sig reported 09/20/20    Elpidio Anis, PA-C  PRESCRIPTION MEDICATION Diazepam vaginal suppository: Insert suppository into the vagina at bedtime as needed for severe pain    [provider]  tiZANidine (ZANAFLEX) 4 MG tablet Take 4 mg by mouth 3 (three) times daily. 12/17/20   [provider]    Family History Family History  Problem Relation Age of Onset   Lupus Mother    Hypertension Mother    Heart failure Mother    Heart attack Mother    Sickle cell trait Mother    Scleroderma Mother    Hypertension Father    Arthritis Maternal Grandmother    Diabetes Maternal Grandmother    Hypertension Maternal Grandmother    Stroke Maternal Grandmother    Heart failure Maternal Grandmother    Heart attack Maternal Grandmother    Asthma Maternal Grandmother    Arthritis Maternal Grandfather    Diabetes Maternal Grandfather    Hypertension Maternal Grandfather    Stroke Maternal Grandfather    Heart failure Maternal Grandfather    Heart attack Maternal Grandfather    Kidney disease Maternal Grandfather    Other Neg Hx     Social History Social History   Tobacco Use   Smoking status: Never   Smokeless tobacco: Never  Vaping Use   Vaping Use: Never used  Substance Use Topics   Alcohol use: Yes    Comment: Socially    Drug use: No     Allergies   Patient has no known allergies.   Review of Systems Review of Systems Per HPI  Physical Exam Triage Vital Signs ED Triage Vitals  Enc Vitals Group     BP 02/16/21 1451 119/85     Pulse Rate 02/16/21 1451 (!) 125     Resp 02/16/21 1451 16     Temp 02/16/21 1451 99.2 F (37.3 C)     Temp Source 02/16/21 1451 Oral     SpO2 02/16/21 1451 98 %     Weight --      Height --      Head Circumference --      Peak Flow --      Pain Score 02/16/21 1453 4     Pain Loc --      Pain Edu? --      Excl. in GC? --    No data found.  Updated Vital Signs BP 119/85 (BP Location: Left Arm)   Pulse (!) 125   Temp 99.2 F (37.3 C) (Oral)    Resp 16   LMP 01/19/2021 (Approximate)   SpO2 98%   Visual Acuity Right Eye Distance:   Left Eye Distance:   Bilateral Distance:  Right Eye Near:   Left Eye Near:    Bilateral Near:     Physical Exam Constitutional:      General: She is not in acute distress.    Appearance: Normal appearance. She is not toxic-appearing or diaphoretic.  HENT:     Head: Normocephalic and atraumatic.     Right Ear: Tympanic membrane and ear canal normal.     Left Ear: Tympanic membrane and ear canal normal.     Nose: Nose normal.     Mouth/Throat:     Lips: Pink.     Mouth: Mucous membranes are moist.     Pharynx: Oropharyngeal exudate and posterior oropharyngeal erythema present. No pharyngeal swelling.     Tonsils: No tonsillar exudate or tonsillar abscesses.  Eyes:     Extraocular Movements: Extraocular movements intact.     Conjunctiva/sclera: Conjunctivae normal.  Cardiovascular:     Rate and Rhythm: Normal rate.     Pulses: Normal pulses.     Heart sounds: Normal heart sounds.  Pulmonary:     Effort: Pulmonary effort is normal. No respiratory distress.     Breath sounds: Normal breath sounds.  Skin:    General: Skin is warm and dry.  Neurological:     General: No focal deficit present.     Mental Status: She is alert and oriented to person, place, and time. Mental status is at baseline.  Psychiatric:        Mood and Affect: Mood normal.        Behavior: Behavior normal.        Thought Content: Thought content normal.        Judgment: Judgment normal.     UC Treatments / Results  Labs (all labs ordered are listed, but only abnormal results are displayed) Labs Reviewed  NOVEL CORONAVIRUS, NAA  POCT RAPID STREP A (OFFICE)    EKG   Radiology No results found.  Procedures Procedures (including critical care time)  Medications Ordered in UC Medications - No data to display  Initial Impression / Assessment and Plan / UC Course  I have reviewed the triage vital  signs and the nursing notes.  Pertinent labs & imaging results that were available during my care of the patient were reviewed by me and considered in my medical decision making (see chart for details).     Rapid strep test was negative but still suspicious of bacterial infection of the throat given appearance of posterior pharynx on exam.  Throat culture is pending.  No signs of peritonsillar abscess.  Will treat with amoxicillin x10 days.  Patient reports that she has not received any antibiotics recently even with urological procedure.  This is consistent with urology notes.  No physical damage seen that could be as a result of intubation.  Although, advised patient to follow-up with ENT specialist with provided contact information if sore throat persists after current treatment plan.  No red flags on exam.  Discussed return precautions.  Patient verbalized understanding and was agreeable with plan. Final Clinical Impressions(s) / UC Diagnoses   Final diagnoses:  Sore throat  Encounter for laboratory testing for COVID-19 virus     Discharge Instructions      Your rapid strep test was negative but still suspicious of bacterial infection of the throat given the appearance of it.  Amoxicillin has been prescribed to help treat this.  Please follow-up with provided contact formation for ear, nose, throat doctor if pain persists.  COVID test is  pending.  We will call if it is positive.    ED Prescriptions     Medication Sig Dispense Auth. Provider   amoxicillin (AMOXIL) 875 MG tablet Take 1 tablet (875 mg total) by mouth 2 (two) times daily for 10 days. 20 tablet Dana, Acie Fredrickson, [place]      PDMP not reviewed this encounter.   Gustavus Bryant, [place] 02/16/21 1525

## 2021-02-16 NOTE — ED Triage Notes (Signed)
Had surgery 3 weeks prior, was intubated. Since then has had a sore throat. Now has [place] patches in the back of her throat. Concerned for strep

## 2021-02-17 LAB — NOVEL CORONAVIRUS, NAA: SARS-CoV-2, NAA: NOT DETECTED

## 2021-02-17 LAB — SARS-COV-2, NAA 2 DAY TAT

## 2021-02-20 LAB — CULTURE, GROUP A STREP (THRC)

## 2021-03-27 ENCOUNTER — Encounter: Payer: Self-pay | Admitting: Emergency Medicine

## 2021-03-27 ENCOUNTER — Ambulatory Visit
Admission: EM | Admit: 2021-03-27 | Discharge: 2021-03-27 | Disposition: A | Discharge status: Home / Self Care | Payer: Medicaid Other | Attending: Physician Assistant | Admitting: Physician Assistant

## 2021-03-27 ENCOUNTER — Other Ambulatory Visit: Payer: Self-pay

## 2021-03-27 DIAGNOSIS — B370 Candidal stomatitis: Secondary | ICD-10-CM

## 2021-03-27 MED ORDER — FLUCONAZOLE 200 MG PO TABS: 200.0000 mg | ORAL_TABLET | Freq: Every day | ORAL | 0 refills | Status: AC

## 2021-03-27 NOTE — ED Triage Notes (Signed)
Patient concern for thrush in her mouth.  Patient states that it comes and goes, currently on a lot of medications due to recent surgery.

## 2021-03-27 NOTE — ED Provider Notes (Signed)
[facility]    CSN: [id number] Arrival date & time: 03/27/21  4562      History   Chief Complaint Chief Complaint  Patient presents with   Thrush    HPI Tonya Weeks is a 30 y.o. female.   Patient here today for evaluation of suspected thrush in her mouth and throat. She reports that symptoms will wax and wane but that she thinks thrush developed from medications she has been taking since recent surgery. She denies thrush like this in the past.   The history is provided by the patient.   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 Authorizing Provider  DESCOVY 200-25 MG tablet Take 1 tablet by mouth daily. 06/14/17  Yes [provider]  ELMIRON 100 MG capsule Take 100 mg by mouth 3 (three) times daily. 12/17/20  Yes [provider]  Emtricitabine-Tenofovir DF (TRUVADA PO)  Take 1 tablet by mouth daily.   Yes [provider]  fluconazole (DIFLUCAN) 200 MG tablet Take 1 tablet (200 mg total) by mouth daily for 7 days. 03/27/21 04/03/21 Yes Tomi Bamberger, PA-C  gabapentin (NEURONTIN) 300 MG capsule Take 600 mg by mouth 3 (three) times daily. 12/19/20  Yes [provider]  hydrocortisone (ANUSOL-HC) 25 MG suppository Place 1 suppository (25 mg total) rectally 2 (two) times daily. 01/15/21  Yes Caccavale, Sophia, PA-C  hydrOXYzine (ATARAX/VISTARIL) 25 MG tablet Take 1 tablet (25 mg total) by mouth nightly. 1-2 one hour before sleep 12/05/20  Yes [provider]  lidocaine (LMX) 4 % cream Apply 1 application topically as needed. Patient taking differently: Apply 1 application topically daily as needed (for bodyaches). 09/17/20  Yes Joy, Shawn C, PA-C  naproxen sodium (ALEVE) 220 MG tablet Take 440 mg by mouth daily as needed (pain).   Yes [provider]  nystatin (MYCOSTATIN) 100000 UNIT/ML suspension Take 5 mLs (500,000 Units total) by mouth 4 (four) times daily. 01/15/21  Yes Caccavale, Sophia, PA-C  ondansetron (ZOFRAN ODT) 4 MG disintegrating tablet Take 1 tablet (4 mg total) by  mouth every 8 (eight) hours as needed for nausea or vomiting. 12/25/20  Yes Badalamente, Rose Phi, PA-C  oxyCODONE (ROXICODONE) 5 MG immediate release tablet Take 1 tablet (5 mg total) by mouth every 4 (four) hours as needed for severe pain. 01/19/21  Yes Charlynne Pander, MD  oxyCODONE-acetaminophen (PERCOCET/ROXICET) 5-325 MG tablet Take 1 tablet by mouth every 6 (six) hours as needed for severe pain. 09/17/20  Yes Joy, Shawn C, PA-C  phenazopyridine (PYRIDIUM) 200 MG tablet Take 1 tablet (200 mg total) by mouth 3 (three) times daily. 09/20/20  Yes Elpidio Anis, PA-C  PRESCRIPTION MEDICATION Diazepam vaginal suppository: Insert suppository into the vagina at bedtime as needed for severe pain   Yes [provider]  tiZANidine (ZANAFLEX) 4 MG tablet Take 4  mg by mouth 3 (three) times daily. 12/17/20  Yes [provider]    Family History Family History  Problem Relation Age of Onset   Lupus Mother    Hypertension Mother    Heart failure Mother    Heart attack Mother    Sickle cell trait Mother    Scleroderma Mother    Hypertension Father    Arthritis Maternal Grandmother    Diabetes Maternal Grandmother    Hypertension Maternal Grandmother    Stroke Maternal Grandmother    Heart failure Maternal Grandmother    Heart attack Maternal Grandmother    Asthma Maternal Grandmother    Arthritis Maternal Grandfather    Diabetes Maternal Grandfather    Hypertension Maternal Grandfather    Stroke Maternal Grandfather    Heart failure Maternal Grandfather    Heart attack Maternal Grandfather    Kidney disease Maternal Grandfather    Other Neg Hx     Social History Social History   Tobacco Use   Smoking status: Never   Smokeless tobacco: Never  Vaping Use   Vaping Use: Never used  Substance Use Topics   Alcohol use: Yes    Comment: Socially    Drug use: No     Allergies   Patient has no known allergies.   Review of Systems Review of Systems  Constitutional:  Negative for chills and fever.  HENT:  Positive for sore throat. Negative for congestion.   Eyes:  Negative for discharge and redness.  Respiratory:  Negative for cough and shortness of breath.   Gastrointestinal:  Negative for abdominal pain, diarrhea, nausea and vomiting.    Physical Exam Triage Vital Signs ED Triage Vitals  Enc Vitals Group     BP      Pulse      Resp      Temp      Temp src      SpO2      Weight      Height      Head Circumference      Peak Flow      Pain Score      Pain Loc      Pain Edu?      Excl. in GC?    No data found.  Updated Vital Signs BP (!) 139/96 (BP Location: Left Arm)    Pulse (!) 131    Temp 97.9 F (36.6 C) (Oral)    Resp 18    Ht 5\' 3"  (1.6 m)    Wt 150 lb (68 kg)    LMP 03/25/2021    SpO2 99%     Breastfeeding No    BMI 26.57 kg/m     Physical Exam  Vitals and nursing note reviewed.  Constitutional:      General: She is not in acute distress.    Appearance: Normal appearance. She is not ill-appearing.  HENT:     Head: Normocephalic and atraumatic.     Nose: No congestion or rhinorrhea.     Mouth/Throat:     Mouth: Mucous membranes are moist.     Pharynx: Oropharyngeal exudate and posterior oropharyngeal erythema present.     Comments: Ki coating noted to most of posterior palate Eyes:     Conjunctiva/sclera: Conjunctivae normal.  Cardiovascular:     Rate and Rhythm: Normal rate.     Heart sounds: No murmur heard. Pulmonary:     Effort: Pulmonary effort is normal. No respiratory distress.  Skin:    General: Skin is warm and dry.  Neurological:     Mental Status: She is alert.  Psychiatric:        Mood and Affect: Mood normal.        Thought Content: Thought content normal.     UC Treatments / Results  Labs (all labs ordered are listed, but only abnormal results are displayed) Labs Reviewed - No data to display  EKG   Radiology No results found.  Procedures Procedures (including critical care time)  Medications Ordered in UC Medications - No data to display  Initial Impression / Assessment and Plan / UC Course  I have reviewed the triage vital signs and the nursing notes.  Pertinent labs & imaging results that were available during my care of the patient were reviewed by me and considered in my medical decision making (see chart for details).    Fluconazole prescribed for thrush treatment. Recommended follow up if no improvement with treatment or if symptoms worsen in any way.   Final Clinical Impressions(s) / UC Diagnoses   Final diagnoses:  Thrush   Discharge Instructions   None    ED Prescriptions     Medication Sig Dispense Auth. Provider   fluconazole (DIFLUCAN) 200 MG tablet Take 1 tablet (200 mg total) by mouth daily for 7 days. 7  tablet Tomi Bamberger, PA-C      PDMP not reviewed this encounter.   Tomi Bamberger, PA-C 03/27/21 [id number]

## 2021-04-23 ENCOUNTER — Ambulatory Visit: Admit: 2021-04-23 | Discharge: 2021-04-24 | Payer: BLUE CROSS/BLUE SHIELD | Attending: Clinical | Primary: Clinical

## 2021-04-23 ENCOUNTER — Ambulatory Visit: Admit: 2021-04-23 | Discharge: 2021-04-24 | Payer: BLUE CROSS/BLUE SHIELD

## 2021-04-23 DIAGNOSIS — B20 Human immunodeficiency virus [HIV] disease: Secondary | ICD-10-CM | POA: Diagnosis not present

## 2021-04-23 DIAGNOSIS — B009 Herpesviral infection, unspecified: Principal | ICD-10-CM

## 2021-04-23 MED ORDER — SULFAMETHOXAZOLE 800 MG-TRIMETHOPRIM 160 MG TABLET
ORAL_TABLET | Freq: Every day | ORAL | 5 refills | 30.00000 days | Status: CP
Start: 2021-04-23 — End: ?
  Filled 2021-04-24: qty 30, 30d supply, fill #0

## 2021-04-23 MED ORDER — BICTEGRAVIR 50 MG-EMTRICITABINE 200 MG-TENOFOVIR ALAFENAM 25 MG TABLET
ORAL_TABLET | Freq: Every day | ORAL | 5 refills | 30.00000 days | Status: CP
Start: 2021-04-23 — End: 2021-05-23
  Filled 2021-04-23: qty 30, 30d supply, fill #0

## 2021-04-23 MED ORDER — VALACYCLOVIR 1 GRAM TABLET
ORAL_TABLET | 0 refills | 0 days | Status: CP
Start: 2021-04-23 — End: ?
  Filled 2021-04-23: qty 20, 10d supply, fill #0

## 2021-04-28 DIAGNOSIS — N3010 Interstitial cystitis (chronic) without hematuria: Secondary | ICD-10-CM | POA: Diagnosis not present

## 2021-04-29 ENCOUNTER — Telehealth: Payer: Medicaid Other | Admitting: Physician Assistant

## 2021-04-29 DIAGNOSIS — B370 Candidal stomatitis: Secondary | ICD-10-CM | POA: Diagnosis not present

## 2021-04-29 MED ORDER — FLUCONAZOLE 200 MG PO TABS: 200.0000 mg | ORAL_TABLET | Freq: Every day | ORAL | 0 refills | Status: AC

## 2021-04-29 NOTE — Progress Notes (Signed)
Virtual Visit Consent   Tonya Weeks, you are scheduled for a virtual visit with a [facility] provider today.     Just as with appointments in the office, your consent must be obtained to participate.  Your consent will be active for this visit and any virtual visit you may have with one of our providers in the next 365 days.     If you have a MyChart account, a copy of this consent can be sent to you electronically.  All virtual visits are billed to your insurance company just like a traditional visit in the office.    As this is a virtual visit, video technology does not allow for your provider to perform a traditional examination.  This may limit your provider's ability to fully assess your condition.  If your provider identifies any concerns that need to be evaluated in person or the need to arrange testing (such as labs, EKG, etc.), we will make arrangements to do so.     Although advances in technology are sophisticated, we cannot ensure that it will always work on either your end or our end.  If the connection with a video visit is poor, the visit may have to be switched to a telephone visit.  With either a video or telephone visit, we are not always able to ensure that we have a secure connection.     I need to obtain your verbal consent now.   Are you willing to proceed with your visit today?    Tonya Weeks has provided verbal consent on 04/29/2021 for a virtual visit (video or telephone).   Piedad ClimesWilliam Cody Nylia Gavina, [place]PA-C   Date: 04/29/2021 3:38 PM   Virtual Visit via Video Note   I, Piedad ClimesWilliam Cody Concetta Guion, connected with  Tonya Weeks  ([id number], 12/07/1990) on 04/29/21 at  3:30 PM EST by a video-enabled telemedicine application and verified that I am speaking with the correct person using two identifiers.  Location: Patient: Virtual Visit Location Patient: Home Provider: Virtual Visit Location Provider: Home Office   I discussed the limitations of evaluation and management by  telemedicine and the availability of in person appointments. The patient expressed understanding and agreed to proceed.    History of Present Illness: Tonya Weeks is a 31 y.o. who identifies as a female who was assigned female at birth, and is being seen today for thrush. Has history of oropharyngeal thrush, most recently a month ago which was treated with a 7-day course of Diflucan with resolution. Notes she has been doing well until noting recurrence of symptoms this morning. First noted on inside of cheeks (buccal mucosa) and then noted in the mirror was on her uvula. [facility]Dr[facility] Brantley Flinguberman). Just seen last week. Was finally agreeable to restart treatment. Her CD4 count was 24 on 04/23/2021. She was started on Biktarvy.  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   Depression 09/05/2012   Syphilis contact, treated 01/07/2012   HIV disease (HCC) 09/01/2011    Allergies: No Known Allergies Medications:  Current Outpatient Medications:  fluconazole (DIFLUCAN) 200 MG tablet, Take 1 tablet (200 mg total) by mouth daily for 10 days., Disp: 10 tablet, Rfl: 0   DESCOVY 200-25 MG tablet, Take 1 tablet by mouth daily., Disp: , Rfl: 5   ELMIRON 100 MG capsule, Take 100 mg by mouth 3 (three) times daily., Disp: , Rfl:    Emtricitabine-Tenofovir DF (TRUVADA PO), Take 1 tablet by mouth daily., Disp: , Rfl:    gabapentin (NEURONTIN) 300 MG capsule, Take 600 mg by mouth 3 (three) times daily., Disp: , Rfl:    hydrocortisone (ANUSOL-HC) 25 MG suppository, Place  1 suppository (25 mg total) rectally 2 (two) times daily., Disp: 12 suppository, Rfl: 0   hydrOXYzine (ATARAX/VISTARIL) 25 MG tablet, Take 1 tablet (25 mg total) by mouth nightly. 1-2 one hour before sleep, Disp: , Rfl:    lidocaine (LMX) 4 % cream, Apply 1 application topically as needed. (Patient taking differently: Apply 1 application topically daily as needed (for bodyaches).), Disp: 30 g, Rfl: 0   naproxen sodium (ALEVE) 220 MG tablet, Take 440 mg by mouth daily as needed (pain)., Disp: , Rfl:    nystatin (MYCOSTATIN) 100000 UNIT/ML suspension, Take 5 mLs (500,000 Units total) by mouth 4 (four) times daily., Disp: 60 mL, Rfl: 0   ondansetron (ZOFRAN ODT) 4 MG disintegrating tablet, Take 1 tablet (4 mg total) by mouth every 8 (eight) hours as needed for nausea or vomiting., Disp: 20 tablet, Rfl: 0   oxyCODONE (ROXICODONE) 5 MG immediate release tablet, Take 1 tablet (5 mg total) by mouth every 4 (four) hours as needed for Weeks pain., Disp: 15 tablet, Rfl: 0   oxyCODONE-acetaminophen (PERCOCET/ROXICET) 5-325 MG tablet, Take 1 tablet by mouth every 6 (six) hours as needed for Weeks pain., Disp: 15 tablet, Rfl: 0   phenazopyridine (PYRIDIUM) 200 MG tablet, Take 1 tablet (200 mg total) by mouth 3 (three) times daily., Disp: 12 tablet, Rfl: 0   PRESCRIPTION MEDICATION, Diazepam vaginal suppository: Insert suppository into the vagina at bedtime as needed for Weeks pain, Disp: , Rfl:    tiZANidine (ZANAFLEX) 4 MG tablet, Take 4 mg by mouth 3 (three) times daily., Disp: , Rfl:   Observations/Objective: Patient is well-developed, well-nourished in no acute distress.  Resting comfortably at home.  Head is normocephalic, atraumatic.  No labored breathing. Speech is clear and coherent with logical content.  Patient is alert and oriented at baseline.  Thrush noted of posterior oropharynx and uvula without any substantial swelling noted.   Assessment and Plan: 1. Oropharyngeal candidiasis -  fluconazole (DIFLUCAN) 200 MG tablet; Take 1 tablet (200 mg total) by mouth daily for 10 days.  Dispense: 10 tablet; Refill: 0  In HIV patient who has only recently been restarted on antivirals after not taking in some time. CD4 count of 24 with Viral RNA load of > 300,000. Concern for more significant candidal infection (esophageal involvement, etc). Will start her on Fluconazole as she has tolerated well and responded well int he past. Will give 200 mg daily for 10 days. She is to contact her ID specialist to make them aware so they can further adjust treatment and monitor response. Hopefully she will be consistent with antivirals so her viral load will decrease and CD4 counts can have a positived response.   Follow Up Instructions: I discussed the assessment and treatment plan with the patient. The patient was provided an opportunity to ask questions and all were answered. The patient agreed with the plan and demonstrated an understanding  of the instructions.  A copy of instructions were sent to the patient via MyChart unless otherwise noted below.   The patient was advised to call back or seek an in-person evaluation if the symptoms worsen or if the condition fails to improve as anticipated.  Time:  I spent 10 minutes with the patient via telehealth technology discussing the above problems/concerns.    Piedad Climes, PA-C

## 2021-04-29 NOTE — Patient Instructions (Signed)
Tonya Weeks, thank you for joining Piedad Climes, PA-C for today's virtual visit.  While this provider is not your primary care provider (PCP), if your PCP is located in our provider database this encounter information will be shared with them immediately following your visit.  Consent: (Patient) Tonya Weeks provided verbal consent for this virtual visit at the beginning of the encounter.  Current Medications:  Current Outpatient Medications:    DESCOVY 200-25 MG tablet, Take 1 tablet by mouth daily., Disp: , Rfl: 5   ELMIRON 100 MG capsule, Take 100 mg by mouth 3 (three) times daily., Disp: , Rfl:    Emtricitabine-Tenofovir DF (TRUVADA PO), Take 1 tablet by mouth daily., Disp: , Rfl:    gabapentin (NEURONTIN) 300 MG capsule, Take 600 mg by mouth 3 (three) times daily., Disp: , Rfl:    hydrocortisone (ANUSOL-HC) 25 MG suppository, Place 1 suppository (25 mg total) rectally 2 (two) times daily., Disp: 12 suppository, Rfl: 0   hydrOXYzine (ATARAX/VISTARIL) 25 MG tablet, Take 1 tablet (25 mg total) by mouth nightly. 1-2 one hour before sleep, Disp: , Rfl:    lidocaine (LMX) 4 % cream, Apply 1 application topically as needed. (Patient taking differently: Apply 1 application topically daily as needed (for bodyaches).), Disp: 30 g, Rfl: 0   naproxen sodium (ALEVE) 220 MG tablet, Take 440 mg by mouth daily as needed (pain)., Disp: , Rfl:    nystatin (MYCOSTATIN) 100000 UNIT/ML suspension, Take 5 mLs (500,000 Units total) by mouth 4 (four) times daily., Disp: 60 mL, Rfl: 0   ondansetron (ZOFRAN ODT) 4 MG disintegrating tablet, Take 1 tablet (4 mg total) by mouth every 8 (eight) hours as needed for nausea or vomiting., Disp: 20 tablet, Rfl: 0   oxyCODONE (ROXICODONE) 5 MG immediate release tablet, Take 1 tablet (5 mg total) by mouth every 4 (four) hours as needed for severe pain., Disp: 15 tablet, Rfl: 0   oxyCODONE-acetaminophen (PERCOCET/ROXICET) 5-325 MG tablet, Take 1 tablet by mouth  every 6 (six) hours as needed for severe pain., Disp: 15 tablet, Rfl: 0   phenazopyridine (PYRIDIUM) 200 MG tablet, Take 1 tablet (200 mg total) by mouth 3 (three) times daily., Disp: 12 tablet, Rfl: 0   PRESCRIPTION MEDICATION, Diazepam vaginal suppository: Insert suppository into the vagina at bedtime as needed for severe pain, Disp: , Rfl:    tiZANidine (ZANAFLEX) 4 MG tablet, Take 4 mg by mouth 3 (three) times daily., Disp: , Rfl:    Medications ordered in this encounter:  No orders of the defined types were placed in this encounter.    *If you need refills on other medications prior to your next appointment, please contact your pharmacy*  Follow-Up: Call back or seek an in-person evaluation if the symptoms worsen or if the condition fails to improve as anticipated.  Other Instructions Take the fluconazole as directed. I want you to call your Infectious Disease provider to make them aware of this and current treatment so they can make further adjustments and get you in for a re-evaluation.    If you have been instructed to have an in-person evaluation today at a local Urgent Care facility, please use the link below. It will take you to a list of all of our available [facility], including address, phone number and hours of operation. Please do not delay care.  [facility]  If you or a family member do not have a primary care provider, use the link below to  schedule a visit and establish care. When you choose a [facility] primary care physician or advanced practice provider, you gain a long-term partner in health. Find a Primary Care Provider  Learn more about 's in-office and virtual care options: [facility]

## 2021-05-05 DIAGNOSIS — N3010 Interstitial cystitis (chronic) without hematuria: Secondary | ICD-10-CM | POA: Diagnosis not present

## 2021-05-07 DIAGNOSIS — N3010 Interstitial cystitis (chronic) without hematuria: Secondary | ICD-10-CM | POA: Diagnosis not present

## 2021-05-16 DIAGNOSIS — R102 Pelvic and perineal pain: Secondary | ICD-10-CM | POA: Diagnosis not present

## 2021-05-16 DIAGNOSIS — Z87440 Personal history of urinary (tract) infections: Secondary | ICD-10-CM | POA: Diagnosis not present

## 2021-05-16 DIAGNOSIS — Z79899 Other long term (current) drug therapy: Secondary | ICD-10-CM | POA: Diagnosis not present

## 2021-05-16 MED ORDER — VALACYCLOVIR 1 GRAM TABLET
ORAL_TABLET | 0 refills | 0 days | Status: CP
Start: 2021-05-16 — End: ?
  Filled 2021-05-16: qty 20, 10d supply, fill #0

## 2021-05-16 MED FILL — BIKTARVY 50 MG-200 MG-25 MG TABLET: ORAL | 30 days supply | Qty: 30 | Fill #0

## 2021-05-16 NOTE — Unmapped (Signed)
[facility]   Patient Onboarding/Medication Counseling    Brandy Evans is a 31 y.o. female with HIV who I am counseling today on continuation of therapy.  I am speaking to the patient.    Was a Nurse, learning disability used for this call? No    Verified patient's date of birth / HIPAA.    Specialty medication(s) to be sent: Infectious Disease: Biktarvy      Non-specialty medications/supplies to be sent: smz-tmp 800-160mg  and valacyclovir 1000mg :new Rx needed: sent request      Medications not needed at this time: n/a         Biktarvy (bictegravir, emtricitabine, and tenofovir alafenamide)    The patient declined counseling on medication administration, missed dose instructions, goals of therapy, side effects and monitoring parameters, warnings and precautions, drug/food interactions and storage, handling precautions, and disposal because they have taken the medication previously. The information in the declined sections below are for informational purposes only and was not discussed with patient.       Medication & Administration     Dosage: Take 1 tablet by mouth daily    Administration: Take without regard to food    Adherence/Missed dose instructions: take missed dose as soon as you remember. If it is close to the time of your next dose, skip the dose and resume with your next scheduled dose.    Goals of Therapy     To suppress viral replication and keep patient's HIV undetectable by lab tests    Side Effects & Monitoring Parameters     Common Side Effects:  ??? Diarrhea  ??? Upset stomach  ??? Headache  ??? Changes in Weight  ??? Changes in mood    The following side effects should be reported to the provider:     ?? If patient experiences: signs of an allergic reaction (rash; hives; itching; red, swollen, blistered, or peeling skin with or without fever; wheezing; tightness in the chest or throat; trouble breathing, swallowing, or talking; unusual hoarseness; or swelling of the mouth, face, lips, tongue, or throat)  ?? signs of kidney problems (unable to pass urine, change in how much urine is passed, blood in the urine, or a big weight gain)  ?? signs of liver problems (dark urine, feeling tired, not hungry, upset stomach or stomach pain, light-colored stools, throwing up, or yellow skin or eyes)  ?? signs of lactic acidosis (fast breathing, fast heartbeat, a heartbeat that does not feel normal, very bad upset stomach or throwing up, feeling very sleepy, shortness of breath, feeling very tired or weak, very bad dizziness, feeling cold, or muscle pain or cramps)  Weight gain: some patients have reported weight gain after starting this medication. The amount of weight can vary.    Monitoring Parameters:  ?? CD4  Count  ?? HIV RNA plasma levels,  ?? Liver function  ?? Total bilirubin  ?? serum creatinine  ?? urine glucose  ?? urine protein (prior to or when initiating therapy and as clinically indicated during therapy);       Drug/Food Interactions     ??? Medication list reviewed in Epic. The patient was instructed to inform the care team before taking any new medications or supplements. I told her that if she takes a Tums, then the Parks can either be taken at the same time with food or she can take the Biktarvy 2 hours before or 6 hours after the Tums.Marland Kitchen   ??? Calcium Salts: May decrease the serum concentration  of Biktarvy. If taken with food, Biktarvy can be administered with calcium salts.   ??? Iron Preparations: May decrease the serum concentration of Biktarvy. If taken with food, Biktarvy can be administered with Ferrous sulfate. If taken on an empty stomach, Biktarvy must be taken 2 hours before ferrous sulfate. Avoid other iron salts.    Contraindications, Warnings, & Precautions     ??? Black Box Warning: Severe acute exacertbations of HBV have been reported in patients coinfected with HIV-1 and HBV fllowing discontinuation of therapy  ??? Coadministration with dofetilide, rifampin is contraindicated  ??? Immune reconstitution syndrome: Patients may develop immune reconstitution syndrome, resulting in the occurrence of an inflammatory response to an indolent or residual opportunistic infection or activation of autoimmune disorders (eg, Graves disease, polymyositis, Guillain-Barr?? syndrome, autoimmune hepatitis)   ??? Lactic acidosis/hepatomegaly  ??? Renal toxicity: patients with preexisting renal impairment and those taking nephrotoxic agents (including NSAIDs) are at increased risk.     Storage, Handling Precautions, & Disposal     ??? Store in the original container at room temperature.   ??? Keep lid tightly closed.   ??? Store in a dry place. Do not store in a bathroom.   ??? Keep all drugs in a safe place. Keep all drugs out of the reach of children and pets.   ??? Throw away unused or expired drugs. Do not flush down a toilet or pour down a drain unless you are told to do so. Check with your pharmacist if you have questions about the best way to throw out drugs. There may be drug take-back programs in your area.        Current Medications (including OTC/herbals), Comorbidities and Allergies     Current Outpatient Medications   Medication Sig Dispense Refill   ??? bictegrav-emtricit-tenofov ala (BIKTARVY) 50-200-25 mg tablet Take 1 tablet by mouth daily. 30 tablet 0   ??? bictegrav-emtricit-tenofov ala (BIKTARVY) 50-200-25 mg tablet Take 1 tablet by mouth daily. 30 tablet 5   ??? gabapentin (NEURONTIN) 100 MG capsule Take 1 capsule (100 mg total) by mouth Three (3) times a day. (Patient taking differently: Take 800 mg by mouth Three (3) times a day.) 90 capsule 0   ??? gabapentin (NEURONTIN) 800 MG tablet Take 800 mg by mouth Three (3) times a day.     ??? HYDROcodone-acetaminophen (NORCO) 5-325 mg per tablet Take 1 tablet by mouth daily as needed for pain.     ??? hydrOXYzine (ATARAX) 25 MG tablet Take 25 mg by mouth Three (3) times a day.     ??? oxybutynin (DITROPAN) 5 MG tablet Take 5 mg by mouth Three (3) times a day as needed.     ??? pentosan polysulfate (ELMIRON) 100 mg capsule Take 100 mg by mouth Three (3) times a day before meals.     ??? sulfamethoxazole-trimethoprim (BACTRIM DS) 800-160 mg per tablet Take 1 tablet (160 mg of trimethoprim total) by mouth daily. 30 tablet 5   ??? tiZANidine (ZANAFLEX) 4 MG tablet Take 4 mg by mouth every twelve (12) hours as needed.     ??? valACYclovir (VALTREX) 1000 MG tablet Take 1 tablet (1000 mg) by mouth two (2) times a day for 7-10 days. 20 tablet 0     No current facility-administered medications for this visit.       No Known Allergies    There is no problem list on file for this patient.      Reviewed and up to date in Epic.  HIV ASSOCIATED LABS:     Lab Results   Component Value Date/Time    HIVRS Detected (A) 04/23/2021 02:50 PM    HIVCP 342,345 (H) 04/23/2021 02:50 PM    ACD4 26 (L) 04/23/2021 02:50 PM         Appropriateness of Therapy     Acute infections noted within Epic:  No active infections  Patient reported infection: None    Is medication and dose appropriate based on diagnosis and infection status? Yes    Prescription has been clinically reviewed: Yes      Baseline Quality of Life Assessment      How many days over the past month did your HIV  keep you from your normal activities? For example, brushing your teeth or getting up in the morning. 0    Financial Information     Medication Assistance provided: None Required    Anticipated copay of $4.00 reviewed with patient. Verified delivery address.    Delivery Information     Scheduled delivery date: 05/19/21    Expected start date: continuation of current therapy    Medication will be delivered via UPS to the prescription address in [facility].  This shipment will not require a signature.      Explained the services we provide at [facility] and that each month we would call to set up refills.  Stressed importance of returning phone calls so that we could ensure they receive their medications in time each month.  Informed patient that we should be setting up refills 7-10 days prior to when they will run out of medication.  A pharmacist will reach out to perform a clinical assessment periodically.  Informed patient that a welcome packet, containing information about our pharmacy and other support services, a Notice of Privacy Practices, and a drug information handout will be sent.      The patient or caregiver noted above participated in the development of this care plan and knows that they can request review of or adjustments to the care plan at any time.      Patient or caregiver verbalized understanding of the above information as well as how to contact the pharmacy at [phone] option 4 with any questions/concerns.  The pharmacy is open Monday through Friday 8:30am-4:30pm.  A pharmacist is available 24/7 via pager to answer any clinical questions they may have.    Patient Specific Needs     - Does the patient have any physical, cognitive, or cultural barriers? No    - Does the patient have adequate living arrangements? (i.e. the ability to store and take their medication appropriately) Yes    - Did you identify any home environmental safety or security hazards? No    - Patient prefers to have medications discussed with  Patient     - Is the patient or caregiver able to read and understand education materials at a high school level or above? Yes    - Patient's primary language is  English     - Is the patient high risk? No        Roderic Palau  [facility] Specialty Pharmacist

## 2021-05-16 NOTE — Unmapped (Signed)
[facility] Specialty Medication Onboarding    Specialty Medication: BIKTARVY 50-200-25 mg tablet (bictegrav-emtricit-tenofov ala)  Prior Authorization: Not Required   Financial Assistance: No - copay  <$25  Final Copay/Day Supply: $4 / 30    Insurance Restrictions: None     Notes to Pharmacist:     The triage team has completed the benefits investigation and has determined that the patient is able to fill this medication at [facility]. Please contact the patient to complete the onboarding or follow up with the prescribing physician as needed.

## 2021-05-19 MED FILL — SULFAMETHOXAZOLE 800 MG-TRIMETHOPRIM 160 MG TABLET: ORAL | 30 days supply | Qty: 30 | Fill #1

## 2021-05-20 DIAGNOSIS — N9489 Other specified conditions associated with female genital organs and menstrual cycle: Secondary | ICD-10-CM | POA: Diagnosis not present

## 2021-05-20 DIAGNOSIS — N3010 Interstitial cystitis (chronic) without hematuria: Secondary | ICD-10-CM | POA: Diagnosis not present

## 2021-05-20 DIAGNOSIS — R102 Pelvic and perineal pain: Secondary | ICD-10-CM | POA: Diagnosis not present

## 2021-05-23 DIAGNOSIS — J029 Acute pharyngitis, unspecified: Secondary | ICD-10-CM | POA: Insufficient documentation

## 2021-05-23 DIAGNOSIS — B20 Human immunodeficiency virus [HIV] disease: Secondary | ICD-10-CM | POA: Diagnosis not present

## 2021-05-26 DIAGNOSIS — K644 Residual hemorrhoidal skin tags: Secondary | ICD-10-CM | POA: Diagnosis not present

## 2021-05-26 DIAGNOSIS — N3010 Interstitial cystitis (chronic) without hematuria: Secondary | ICD-10-CM | POA: Diagnosis not present

## 2021-06-04 DIAGNOSIS — N3010 Interstitial cystitis (chronic) without hematuria: Secondary | ICD-10-CM | POA: Diagnosis not present

## 2021-06-11 ENCOUNTER — Ambulatory Visit: Admit: 2021-06-11 | Discharge: 2021-06-12 | Payer: BLUE CROSS/BLUE SHIELD

## 2021-06-11 DIAGNOSIS — B20 Human immunodeficiency virus [HIV] disease: Secondary | ICD-10-CM | POA: Diagnosis not present

## 2021-06-11 DIAGNOSIS — Z23 Encounter for immunization: Secondary | ICD-10-CM | POA: Diagnosis not present

## 2021-06-11 LAB — LYMPH MARKER LIMITED,FLOW
ABSOLUTE CD3 CNT: 2565 {cells}/uL (ref 915–3400)
ABSOLUTE CD4 CNT: 243 {cells}/uL — ABNORMAL LOW (ref 510–2320)
ABSOLUTE CD8 CNT: 2268 {cells}/uL — ABNORMAL HIGH (ref 180–1520)
CD3% (T CELLS): 95 % — ABNORMAL HIGH (ref 61–86)
CD4% (T HELPER): 9 % — ABNORMAL LOW (ref 34–58)
CD4:CD8 RATIO: 0.1 — ABNORMAL LOW (ref 0.9–4.8)
CD8% T SUPPRESR: 84 % — ABNORMAL HIGH (ref 12–38)

## 2021-06-11 LAB — CBC W/ AUTO DIFF
BASOPHILS ABSOLUTE COUNT: 0 10*9/L (ref 0.0–0.1)
BASOPHILS RELATIVE PERCENT: 0.2 %
EOSINOPHILS ABSOLUTE COUNT: 0.1 10*9/L (ref 0.0–0.5)
EOSINOPHILS RELATIVE PERCENT: 1.9 %
HEMATOCRIT: 26 % — ABNORMAL LOW (ref 34.0–44.0)
HEMOGLOBIN: 8.4 g/dL — ABNORMAL LOW (ref 11.3–14.9)
LYMPHOCYTES ABSOLUTE COUNT: 2.7 10*9/L (ref 1.1–3.6)
LYMPHOCYTES RELATIVE PERCENT: 55.9 %
MEAN CORPUSCULAR HEMOGLOBIN CONC: 32.5 g/dL (ref 32.0–36.0)
MEAN CORPUSCULAR HEMOGLOBIN: 23.3 pg — ABNORMAL LOW (ref 25.9–32.4)
MEAN CORPUSCULAR VOLUME: 72 fL — ABNORMAL LOW (ref 77.6–95.7)
MEAN PLATELET VOLUME: 7.6 fL (ref 6.8–10.7)
MONOCYTES ABSOLUTE COUNT: 0.3 10*9/L (ref 0.3–0.8)
MONOCYTES RELATIVE PERCENT: 7.2 %
NEUTROPHILS ABSOLUTE COUNT: 1.7 10*9/L — ABNORMAL LOW (ref 1.8–7.8)
NEUTROPHILS RELATIVE PERCENT: 34.8 %
NUCLEATED RED BLOOD CELLS: 0 /100{WBCs} (ref <=4)
PLATELET COUNT: 253 10*9/L (ref 150–450)
RED BLOOD CELL COUNT: 3.61 10*12/L — ABNORMAL LOW (ref 3.95–5.13)
RED CELL DISTRIBUTION WIDTH: 17.1 % — ABNORMAL HIGH (ref 12.2–15.2)
WBC ADJUSTED: 4.8 10*9/L (ref 3.6–11.2)

## 2021-06-11 LAB — BASIC METABOLIC PANEL
ANION GAP: 6 mmol/L (ref 5–14)
BLOOD UREA NITROGEN: 9 mg/dL (ref 9–23)
BUN / CREAT RATIO: 14
CALCIUM: 9.9 mg/dL (ref 8.7–10.4)
CHLORIDE: 105 mmol/L (ref 98–107)
CO2: 26.9 mmol/L (ref 20.0–31.0)
CREATININE: 0.65 mg/dL
EGFR CKD-EPI (2021) FEMALE: >90 mL/min/{1.73_m2} (ref >=60)
GLUCOSE RANDOM: 82 mg/dL (ref 70–179)
POTASSIUM: 3.9 mmol/L (ref 3.4–4.8)
SODIUM: 138 mmol/L (ref 135–145)

## 2021-06-11 LAB — SLIDE REVIEW

## 2021-06-11 LAB — AST: AST (SGOT): 19 U/L (ref <=34)

## 2021-06-11 LAB — BILIRUBIN, TOTAL: BILIRUBIN TOTAL: 0.2 mg/dL — ABNORMAL LOW (ref 0.3–1.2)

## 2021-06-11 LAB — ALT: ALT (SGPT): <7 U/L — ABNORMAL LOW (ref 10–49)

## 2021-06-11 NOTE — Unmapped (Signed)
INFECTIOUS DISEASES CLINIC  [address]  [place], [place]  [address]  P [phone]  F [phone]     Primary care provider: No primary care provider on file.    Assessment/Plan:      Brandy Evans [facility] Christell Constant) is a 31 y.o. female seen today for HIV followup after engaging in care at [facility] 03/2021.    HIV - advanced, uncontrolled (dx'd 2013, nadir CD4 26 (2%) 03/2021)  - chronic, severe   Recent Candida esophagitis  Overall hopefully improved now that she is back on ART. Current regimen: Biktarvy (BIC/FTC/TAF)  Misses doses of ARVs never  Med access via Medicaid  CD4 count below 200 but is on ART; needs primary prophylaxis against PCP and toxo until CD4 > 200 for >43m  Discussed ARV adherence and continuing prophylaxis for PJP    Lab Results   Component Value Date    ACD4 26 (L) 04/23/2021    CD4 2 (L) 04/23/2021    HIVRS Detected (A) 04/23/2021    HIVCP 784,696 (H) 04/23/2021     ??? HIV RNA and safety labs (brief return panel)  ??? Continue current therapy  ??? Discussed importance of ARV adherence    Anemia   Lab Results   Component Value Date    HGB 8.4 (L) 06/11/2021     Lab Results   Component Value Date    HCT 26.0 (L) 06/11/2021   ?? She has a history of sickle cell trait and chronic anemia  ?? This is also concerning in light of uncontrolled HIV (MAI) - will monitor    Pelvic pain  Interstitial cystitis  Difficulty walking  ?? Addressed briefly today  ?? 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 Monitor on ART    COVID-19  Dose 1 07/07/2019  Dose 2 08/01/2019  Dose 3 - offered today 06/11/21   ?? Reinforced ongoing masking, home test access, and protective measures.  ?? Reviewed information for [employer].    Sexual health & secondary prevention  Not in a relationship at present. We have discussed U=U.     Lab Results   Component Value Date    RPR Reactive (A) 04/23/2021    RPR 1:4 04/23/2021    CTNAA Negative 04/23/2021    GCNAA Negative 04/23/2021    SPECSOURCE Urine 04/23/2021     ??? GC/CT NAATs - Obtained at recent visit  ??? RPR - repeat 6 months after prior (has been serofast at 1:4, initial dx 2013)    Health maintenance   Oral health  She does  have a dentist. Last dental exam within the past year.    Eye health  She does  use corrective lenses. Last eye exam within the past year.    Metabolic conditions  Wt Readings from Last 5 Encounters:   06/11/21 (P) 64.9 kg (143 lb)     Lab Results   Component Value Date    CREATININE 0.51 (L) 04/23/2021    PROTEINUA 70 mg/dL (A) 29/52/8413    GLUCOSEU Trace (A) 09/24/2020    GLU 90 04/23/2021    ALT <7 (L) 04/23/2021     # Kidney health - creatinine today  # Bone health - assessment not yet needed (under age 22)  # Diabetes assessment - random glucose today  # NAFLD assessment - suspicion for NAFLD low    Communicable diseases  No  results found for: QFTTBGOLD, QTBG, HEPAIGG, HEPBSAG, HEPBSAB, HEPCAB, HCVRNA, HCVRNAIU, HCVR, HCVIU, RUBEOL, MUMPSIGG, RUBIG, VZVIGG  # TB screening - assessment needed but deferred to future visit  # Hepatitis screening - obtained prior, 2013 -- Hep A IgG and Hep B sAb pos and assessment(s) needed but deferred to future visit (HCV)  # MMR screening - assessment(s) needed but deferred to future visit    Cancer screening  No results found for: PSASCRN, PSA, PAP, FINALDX  # Cervical - assessment(s) needed but deferred to future visit  # Breast - no indication for screening at present    # Anorectal - not yet done  # Colorectal - screening not indicated  # Liver - no screening indicated  # Lung - screening not indicated    Cardiovascular disease  No results found for: CHOL, HDL, LDL, NONHDL, TRIG  # The ASCVD Risk score (Arnett DK, et al., 2019) failed to calculate.  - is not taking aspirin   - is not taking statin  - BP control good  - never smoker  # AAA screening - no indication for screening    Immunization History   Administered Date(s) Administered   ??? COVID-19 VACC,MRNA,(PFIZER)(PF) 07/07/2019, 08/01/2019   ??? DTP 01/05/1991, 06/02/1994   ??? HPV Quadrivalent (Gardasil) 04/15/2015   ??? Haemophilis Influenza Type B Vaccine Hboc 09/16/1990   ??? Hepatitis B Vaccine, Unspecified Formulation 08/03/2001, 09/14/2001, 06/26/2002   ??? HiB, unspecified 08/05/1989, 10/07/1989, 12/03/1989, 01/05/1991   ??? INFLUENZA TIV (TRI) 42MO+ W/ PRESERV (IM) 01/12/2012   ??? Influenza Vaccine Quad (IIV4 PF) 30mo+ injectable 02/13/2013, 02/11/2017   ??? Influenza Virus Vaccine, unspecified formulation 11/28/2014   ??? MMR 09/16/1990, 06/02/1994   ??? OPV 06/02/1994   ??? PNEUMOCOCCAL POLYSACCHARIDE 23 09/01/2011   ??? Polio Virus Vaccine, Unspecified Formulation 08/12/1989, 10/07/1989, 12/03/1989, 11/15/1990   ??? Td (adult) unspecified formulation 08/03/2001   ??? TdaP 04/16/2017     ??? Immunizations today - COVID (bivalent booster). I deferred flu as we are nearing end of season. Will also need PCV20. Would add VZV when CD4 is higher, HPV.     I personally spent 24 minutes face-to-face with the patient and greater than 50% of that time was spent in counseling or coordinating care with the patient regarding medication adherence, referrals and coordination of care and need for recommended screening tests. I spent an additional 16 minutes on pre-and post-visit services.     Disposition  Return to clinic 8 weeks or sooner if needed.    Amparo Bristol, MD, MPH   [facility] Infectious Diseases [facility] at [facility]  [address]   [place], [place] [address]  Phone: [phone]   Fax: [phone]        Subjective:      Chief Complaint   HIV followup    HPI  Return patient visit for Brandy Evans, a 31 y.o. female. Recently restarted ART (TAF/FTC/BIC) 03/2021. Has been seeing urology, also had recent ENT appt. No noted intercurrent illness.     HIV history, as documented by Dr. Lelon Perla:  Diagnosed 2013 on routine pregnancy screening, previously followed by [facility]  03/21/2015 genotype no resistance (integrase resistance not included)  01/29/2017 genotype - per provider notes E138A (R-rilpivirine)  Previous ARVs: TDF/FTC + atazanavir/ritonavir (~2014)  TAF/FTC + darunavir/cobicistat (Prescobix) (2017)  TDF/FTC + dolutegravir  TAF/FTC + dolutegravir most recently, per patient into late 2021 when former providers continued to prescribe  Last seen in [facility] 06/11/2017  Multiple  months off medications at different junctures, major barrier to care has been privacy (bad experiences w/ indiscreet healthcare systems previously) and not wanting to call to make appointments. Patient was reengaged in care when [facility] Counselor called and made this appointment for her.    Past Medical History:   Diagnosis Date   ??? Chronic interstitial cystitis    ??? Genital herpes simplex type 1 infection    ??? History of chlamydia 01/2013   ??? History of COVID-19 2022   ??? History of syphilis 01/2012    - 02/09/2012 RPR 1:64 (CareEverywhere), s/p pen G x3 (11/26, 12/3, 03/15/2012)   ??? HIV (human immunodeficiency virus infection) (CMS-HCC) 2013    Diagnosed 2013 on routine pregnancy screening, previously followed by [facility]   ??? Sickle cell trait (CMS-HCC)      Medications and Allergies   Reviewed and updated today. See bottom of this visit's encounter summary for details.  Current Outpatient Medications on File Prior to Visit   Medication Sig   ??? bictegrav-emtricit-tenofov ala (BIKTARVY) 50-200-25 mg tablet Take 1 tablet by mouth daily.   ??? bictegrav-emtricit-tenofov ala (BIKTARVY) 50-200-25 mg tablet Take 1 tablet by mouth daily.   ??? calcium carbonate (TUMS ORAL) Take by mouth.   ??? gabapentin (NEURONTIN) 100 MG capsule Take 1 capsule (100 mg total) by mouth Three (3) times a day. (Patient taking differently: Take 800 mg by mouth Three (3) times a day.)   ??? gabapentin (NEURONTIN) 800 MG tablet Take 800 mg by mouth Three (3) times a day.   ??? HYDROcodone-acetaminophen (NORCO) 5-325 mg per tablet Take 1 tablet by mouth daily as needed for pain.   ??? hydrOXYzine (ATARAX) 25 MG tablet Take 25 mg by mouth Three (3) times a day.   ??? oxybutynin (DITROPAN) 5 MG tablet Take 5 mg by mouth Three (3) times a day as needed.   ??? pentosan polysulfate (ELMIRON) 100 mg capsule Take 100 mg by mouth Three (3) times a day before meals.   ??? sulfamethoxazole-trimethoprim (BACTRIM DS) 800-160 mg per tablet Take 1 tablet (160 mg of trimethoprim total) by mouth daily.   ??? tiZANidine (ZANAFLEX) 4 MG tablet Take 4 mg by mouth every twelve (12) hours as needed.   ??? valACYclovir (VALTREX) 1000 MG tablet Take 1 tablet (1000 mg) by mouth two (2) times a day for 7-10 days.     No current facility-administered medications on file prior to visit.     No Known Allergies    Social History  Lives in [place] at elementary school, on medical leave since May 2022  Assistant middle school cheerleading coach  Boys born 2014, 2019, both HIV negative  Housing - in apartment with family -- mother and children  School / Work & Benefits - medical leave  Tobacco - never smoked tobacco (occasional hookah, none since 08/2020)  Alcohol - never drinks alcohol  Substance use - none  Social History     Tobacco Use   ??? Smoking status: Never   ??? Smokeless tobacco: Not on file   Substance Use Topics   ??? Alcohol use: Never     Review of Systems  As per HPI. Remainder of 10 systems reviewed, negative.        Objective:      BP 135/95 (BP Site: L Arm, BP Position: Sitting, BP Cuff Size: Large)  - Pulse 98  - Temp 37.1 ??C (98.8 ??F) (Oral)   Wt Readings from Last  3 Encounters:   06/11/21 (P) 64.9 kg (143 lb)     Const Tired-appearing and attentive, alert, appropriate   Eyes sclerae anicteric, noninjected OU   ENT dental caries and no thrush, leukoplakia or oral lesions Remainder of exam deferred due to need for masking.   Lymph no cervical or supraclavicular LAD   CV RRR. Soft flow murmur present. No rub or gallop. S1/S2.   Lungs CTAB ant/post, normal work of breathing and no clubbing or cyanosis   GI Soft, no organomegaly. NTND. NABS.   GU deferred   Rectal deferred   Skin no petechiae, ecchymoses or obvious rashes on clothed exam   MSK no joint tenderness   Neuro CN II-XII intact and gait not tested   Psych Frustrated, flat affect. Eye contact good. Linear thoughts. Fluent speech.     Laboratory Data  Reviewed in Epic today, using Synopsis and Chart Review filters.    Lab Results   Component Value Date    CREATININE 0.51 (L) 04/23/2021                _____________________________________________________________________

## 2021-06-11 NOTE — Unmapped (Signed)
Patient completed [employer] application. Eligible for RW B&C grant services and Caps on Charges. IPL= 0%, FPL= 0%. Expires 06/11/22    RW Eligibility Form informing patient about RW services and Caps on charges was sent to patient via MyChart Message    Mickle Asper,  Benefits & Eligibility Coordinator  Time of Intervention: 5 minutes

## 2021-06-11 NOTE — Unmapped (Signed)
It was great to see you today.    The ID clinic phone number is [phone].  The ID clinic fax number is [phone].    Please note that your laboratory and other results may be visible to you in real time, possibly before they reach your provider. Please allow 48 hours for clinical interpretation of these results. Importantly, even if a result is flagged as abnormal, it may not be one that impacts your health.    For urgent issues on nights and weekends you may reach the ID Physician on call through the [facility] Operator at [phone].     URGENT CARE  Please call ahead to speak with the nursing staff if you are in need of an urgent appointment.       MEDICATIONS  For refills please contact your pharmacy and ask them to electronically send or fax the request to the clinic.   Please bring all medications in original bottles to every appointment.    HMAP (formerly ADAP) or [employer] Eligibility (required even if you do not receive medication through [facility])  Please remember to renew your Haneen Bernales eligibility during renewal periods which occur twice a year: January-March and July-September.     The following are needed for each renewal:   - [facility] Identification (if you don't have one, then a bill with your name and address in [place])   - proof of income (award letter, W-2, or last three check stubs)   If you are unable to come in for renewal, let us know if we can mail, fax or e-mail paperwork to you.   HMAP Contact: [phone].     Neldon Mc, MD  [facility] Infectious Diseases Clinic at [facility]  [address]   [place], [place] [address]  Phone: [phone]   Fax: [phone]     Lab info:  Your most recent CD4 T-cell counts and viral loads are below. Here are a few things to keep in mind when looking at your numbers:  Our goal is to get your virus to be undetectable and keep it undetectable. If the virus is undetectable you are much more likely to stay healthy.  We consider your viral load to be undetectable if it says <40 or if it says Not detected.  For most people, we're checking CD4 counts every other visit (once or twice a year, or sometimes even less).  It's normal for your CD4 count to be different from visit to visit.   You can help by taking your medications at about the same time, every single day. If you're having trouble with taking your medications, it's important to let us know.    Lab Results   Component Value Date    ACD4 26 (L) 04/23/2021    CD4 2 (L) 04/23/2021    HIVCP 342,345 (H) 04/23/2021    HIVRS Detected (A) 04/23/2021

## 2021-06-11 NOTE — Unmapped (Signed)
Name: Brandy Evans  Date: 06/11/2021  Address: [address]  [place] [address]   [place] Residence:  [place]  Phone: [phone]     Started assessment with patient options: in clinic     Is this the same address for mailing? No  If No, Mailing Address is: NO MAIL    Housing Status  Stable/Permanent    Insurance  Medicaid    Tax Filing Status  Head of Household    Employment Status  Medically Unable to Work    Income  No Household Income/Deductions of any kind    If no or low income, how are you meeting your basic needs?  Food Stamps/EBT    List Tax Household Members including relationship to you:   n/a    Someone in my household receives: No Household Income/Deductions of any kind  Specify who: [name], [place]    Do you have a current diagnosis for Hepatitis C?  No results found for: HEPCAB, HCVR, HCVRNA, HCVIU, HCVRNAIU, HCVGENOTYPE    Have you used tobacco products four or more times per week in the last six months?  No    Teacher, adult education  Patient has affordable insurance through [employer], [place], and or Employment and is not eligible.    Patient given ACA education if they qualified based on answers to questions above.     Patient was informed of the following programs;   N/A    The following applications/handouts were given to patient:   N/A    The following forms were also started with the patient:   N/A    Juanell Fairly application status: Complete    Patient is applying for [employer] on Charges Only     Mickle Asper,  Benefits & Eligibility Coordinator  Time of Intervention: 5 minutes

## 2021-06-12 LAB — HIV RNA, QUANTITATIVE, PCR
HIV RNA QNT RSLT: DETECTED — AB
HIV RNA: <20 {copies}/mL — ABNORMAL HIGH (ref <0)

## 2021-06-17 MED ORDER — VALACYCLOVIR 1 GRAM TABLET
ORAL_TABLET | 2 refills | 0 days
Start: 2021-06-17 — End: ?

## 2021-06-17 NOTE — Unmapped (Signed)
[facility] Pharm[facility] Refill Coordination Note    Specialty Medication(s) to be Shipped:   Infectious Disease: Biktarvy    Other medication(s) to be shipped: valacyclovir  Generic bactrim ds       Brandy Evans, DOB: 1991/02/26  Phone: [phone] (home)       All above HIPAA information was verified with patient.     Was a Nurse, learning disability used for this call? No    Completed refill call assessment today to schedule patient's medication shipment from the [facility] [phone]).  All relevant notes have been reviewed.     Specialty medication(s) and dose(s) confirmed: Regimen is correct and unchanged.   Changes to medications: [name] reports no changes at this time.  Changes to insurance: No  New side effects reported not previously addressed with a pharmacist or physician: None reported  Questions for the pharmacist: No    Confirmed patient received a Conservation officer, historic buildings and a Surveyor, mining with first shipment. The patient will receive a drug information handout for each medication shipped and additional FDA Medication Guides as required.       DISEASE/MEDICATION-SPECIFIC INFORMATION        N/A    SPECIALTY MEDICATION ADHERENCE     Medication Adherence    Patient reported X missed doses in the last month: 0  Specialty Medication: BIKTARVY              Were doses missed due to medication being on hold? No    Unable to confirm quantity on hand     REFERRAL TO PHARMACIST     Referral to the pharmacist: Not needed      [facility]     Shipping address confirmed in Epic.     Delivery Scheduled: Yes, Expected medication delivery date: 3/24.     Medication will be delivered via UPS to the prescription address in Epic WAM.    Westley Gambles   [facility] Specialty Technician

## 2021-06-18 DIAGNOSIS — N9489 Other specified conditions associated with female genital organs and menstrual cycle: Secondary | ICD-10-CM | POA: Diagnosis not present

## 2021-06-18 DIAGNOSIS — N3010 Interstitial cystitis (chronic) without hematuria: Secondary | ICD-10-CM | POA: Diagnosis not present

## 2021-06-18 MED ORDER — VALACYCLOVIR 1 GRAM TABLET
ORAL_TABLET | 1 refills | 0 days | Status: CP
Start: 2021-06-18 — End: ?
  Filled 2021-06-19: qty 30, 15d supply, fill #0

## 2021-06-18 NOTE — Unmapped (Signed)
Medication Requested: Valtrex      Last Office Visit: 06/11/2021  Next Office Visit: 08/27/2021  Per Provider Note: Patient reports more frequent outbreaks in the past year; having current outbreak  ?? Valacyclovir 1g BID x7-10d until lesions healed  ?? Re-discuss secondary prophylaxis option at future visit       Standing order protocol requirements met?: Yes    Sent to: Pharmacy per protocol    Days Supply Given: 30 days  Number of Refills: 1

## 2021-06-19 MED FILL — SULFAMETHOXAZOLE 800 MG-TRIMETHOPRIM 160 MG TABLET: ORAL | 30 days supply | Qty: 30 | Fill #2

## 2021-06-19 MED FILL — BIKTARVY 50 MG-200 MG-25 MG TABLET: ORAL | 30 days supply | Qty: 30 | Fill #1

## 2021-06-23 DIAGNOSIS — R102 Pelvic and perineal pain: Secondary | ICD-10-CM | POA: Diagnosis not present

## 2021-06-23 DIAGNOSIS — N3010 Interstitial cystitis (chronic) without hematuria: Secondary | ICD-10-CM | POA: Diagnosis not present

## 2021-07-07 DIAGNOSIS — N3010 Interstitial cystitis (chronic) without hematuria: Secondary | ICD-10-CM | POA: Diagnosis not present

## 2021-07-14 DIAGNOSIS — N3010 Interstitial cystitis (chronic) without hematuria: Secondary | ICD-10-CM | POA: Diagnosis not present

## 2021-07-16 DIAGNOSIS — N3010 Interstitial cystitis (chronic) without hematuria: Secondary | ICD-10-CM | POA: Diagnosis not present

## 2021-07-21 DIAGNOSIS — N3010 Interstitial cystitis (chronic) without hematuria: Secondary | ICD-10-CM | POA: Diagnosis not present

## 2021-07-21 DIAGNOSIS — H5213 Myopia, bilateral: Secondary | ICD-10-CM | POA: Diagnosis not present

## 2021-07-21 DIAGNOSIS — N9489 Other specified conditions associated with female genital organs and menstrual cycle: Secondary | ICD-10-CM | POA: Diagnosis not present

## 2021-07-21 NOTE — Unmapped (Signed)
[facility] Pharm[facility] Refill Coordination Note    Specialty Medication(s) to be Shipped:   Infectious Disease: Biktarvy    Other medication(s) to be shipped: generic bactrim ds       Brandy Evans, DOB: 31-Dec-1990  Phone: [phone] (home)       All above HIPAA information was verified with patient.     Was a Nurse, learning disability used for this call? No    Completed refill call assessment today to schedule patient's medication shipment from the [facility] [phone]).  All relevant notes have been reviewed.     Specialty medication(s) and dose(s) confirmed: Regimen is correct and unchanged.   Changes to medications: Farhiya reports no changes at this time.  Changes to insurance: No  New side effects reported not previously addressed with a pharmacist or physician: None reported  Questions for the pharmacist: No    Confirmed patient received a Conservation officer, historic buildings and a Surveyor, mining with first shipment. The patient will receive a drug information handout for each medication shipped and additional FDA Medication Guides as required.       DISEASE/MEDICATION-SPECIFIC INFORMATION        N/A    SPECIALTY MEDICATION ADHERENCE     Medication Adherence    Patient reported X missed doses in the last month: 0  Specialty Medication: biktarvy              Were doses missed due to medication being on hold? No    Unable to confirm quantity on hand    REFERRAL TO PHARMACIST     Referral to the pharmacist: Not needed      [facility]     Shipping address confirmed in Epic.     Delivery Scheduled: Yes, Expected medication delivery date: 4/27.     Medication will be delivered via UPS to the prescription address in Epic WAM.    Westley Gambles   [facility] Specialty Technician

## 2021-07-23 MED FILL — BIKTARVY 50 MG-200 MG-25 MG TABLET: ORAL | 30 days supply | Qty: 30 | Fill #2

## 2021-07-23 MED FILL — SULFAMETHOXAZOLE 800 MG-TRIMETHOPRIM 160 MG TABLET: ORAL | 30 days supply | Qty: 30 | Fill #3

## 2021-07-25 DIAGNOSIS — R102 Pelvic and perineal pain: Secondary | ICD-10-CM | POA: Diagnosis not present

## 2021-07-25 DIAGNOSIS — Z79899 Other long term (current) drug therapy: Secondary | ICD-10-CM | POA: Diagnosis not present

## 2021-07-28 DIAGNOSIS — N9489 Other specified conditions associated with female genital organs and menstrual cycle: Secondary | ICD-10-CM | POA: Diagnosis not present

## 2021-07-28 DIAGNOSIS — N3010 Interstitial cystitis (chronic) without hematuria: Secondary | ICD-10-CM | POA: Diagnosis not present

## 2021-07-30 DIAGNOSIS — N3010 Interstitial cystitis (chronic) without hematuria: Secondary | ICD-10-CM | POA: Diagnosis not present

## 2021-08-04 DIAGNOSIS — N3010 Interstitial cystitis (chronic) without hematuria: Secondary | ICD-10-CM | POA: Diagnosis not present

## 2021-08-04 DIAGNOSIS — N9489 Other specified conditions associated with female genital organs and menstrual cycle: Secondary | ICD-10-CM | POA: Diagnosis not present

## 2021-08-11 DIAGNOSIS — N3010 Interstitial cystitis (chronic) without hematuria: Secondary | ICD-10-CM | POA: Diagnosis not present

## 2021-08-13 DIAGNOSIS — N3010 Interstitial cystitis (chronic) without hematuria: Secondary | ICD-10-CM | POA: Diagnosis not present

## 2021-08-18 DIAGNOSIS — N3010 Interstitial cystitis (chronic) without hematuria: Secondary | ICD-10-CM | POA: Diagnosis not present

## 2021-08-22 DIAGNOSIS — Z79899 Other long term (current) drug therapy: Secondary | ICD-10-CM | POA: Diagnosis not present

## 2021-08-22 DIAGNOSIS — R102 Pelvic and perineal pain: Secondary | ICD-10-CM | POA: Diagnosis not present

## 2021-08-26 MED ORDER — BICTEGRAVIR 50 MG-EMTRICITABINE 200 MG-TENOFOVIR ALAFENAM 25 MG TABLET
ORAL_TABLET | Freq: Every day | ORAL | 5 refills | 30 days
Start: 2021-08-26 — End: 2022-02-22

## 2021-08-26 MED ORDER — SULFAMETHOXAZOLE 800 MG-TRIMETHOPRIM 160 MG TABLET
ORAL_TABLET | Freq: Every day | ORAL | 5 refills | 30 days
Start: 2021-08-26 — End: 2022-02-22

## 2021-08-27 ENCOUNTER — Ambulatory Visit: Admit: 2021-08-27 | Discharge: 2021-08-28 | Payer: BLUE CROSS/BLUE SHIELD

## 2021-08-27 DIAGNOSIS — N3010 Interstitial cystitis (chronic) without hematuria: Secondary | ICD-10-CM | POA: Diagnosis not present

## 2021-08-27 DIAGNOSIS — B20 Human immunodeficiency virus [HIV] disease: Secondary | ICD-10-CM | POA: Diagnosis not present

## 2021-08-27 DIAGNOSIS — Z23 Encounter for immunization: Secondary | ICD-10-CM | POA: Diagnosis not present

## 2021-08-27 DIAGNOSIS — B009 Herpesviral infection, unspecified: Secondary | ICD-10-CM | POA: Diagnosis not present

## 2021-08-27 LAB — BASIC METABOLIC PANEL
ANION GAP: 7 mmol/L (ref 5–14)
BLOOD UREA NITROGEN: 8 mg/dL — ABNORMAL LOW (ref 9–23)
BUN / CREAT RATIO: 9
CALCIUM: 9.8 mg/dL (ref 8.7–10.4)
CHLORIDE: 106 mmol/L (ref 98–107)
CO2: 25.8 mmol/L (ref 20.0–31.0)
CREATININE: 0.85 mg/dL — ABNORMAL HIGH
EGFR CKD-EPI (2021) FEMALE: >90 mL/min/{1.73_m2} (ref >=60)
GLUCOSE RANDOM: 64 mg/dL — ABNORMAL LOW (ref 70–179)
POTASSIUM: 3.7 mmol/L (ref 3.4–4.8)
SODIUM: 139 mmol/L (ref 135–145)

## 2021-08-27 LAB — CBC W/ AUTO DIFF
BASOPHILS ABSOLUTE COUNT: 0 10*9/L (ref 0.0–0.1)
BASOPHILS RELATIVE PERCENT: 1.5 %
EOSINOPHILS ABSOLUTE COUNT: 0.1 10*9/L (ref 0.0–0.5)
EOSINOPHILS RELATIVE PERCENT: 1.7 %
HEMATOCRIT: 28.6 % — ABNORMAL LOW (ref 34.0–44.0)
HEMOGLOBIN: 8.9 g/dL — ABNORMAL LOW (ref 11.3–14.9)
LYMPHOCYTES ABSOLUTE COUNT: 1.5 10*9/L (ref 1.1–3.6)
LYMPHOCYTES RELATIVE PERCENT: 47.8 %
MEAN CORPUSCULAR HEMOGLOBIN CONC: 31 g/dL — ABNORMAL LOW (ref 32.0–36.0)
MEAN CORPUSCULAR HEMOGLOBIN: 20.4 pg — ABNORMAL LOW (ref 25.9–32.4)
MEAN CORPUSCULAR VOLUME: 65.9 fL — ABNORMAL LOW (ref 77.6–95.7)
MEAN PLATELET VOLUME: 8.2 fL (ref 6.8–10.7)
MONOCYTES ABSOLUTE COUNT: 0.3 10*9/L (ref 0.3–0.8)
MONOCYTES RELATIVE PERCENT: 8.5 %
NEUTROPHILS ABSOLUTE COUNT: 1.3 10*9/L — ABNORMAL LOW (ref 1.8–7.8)
NEUTROPHILS RELATIVE PERCENT: 40.5 %
NUCLEATED RED BLOOD CELLS: 0 /100{WBCs} (ref <=4)
PLATELET COUNT: 267 10*9/L (ref 150–450)
RED BLOOD CELL COUNT: 4.34 10*12/L (ref 3.95–5.13)
RED CELL DISTRIBUTION WIDTH: 17.9 % — ABNORMAL HIGH (ref 12.2–15.2)
WBC ADJUSTED: 3.1 10*9/L — ABNORMAL LOW (ref 3.6–11.2)

## 2021-08-27 LAB — SLIDE REVIEW

## 2021-08-27 MED ORDER — VALACYCLOVIR 1 GRAM TABLET
ORAL_TABLET | Freq: Two times a day (BID) | ORAL | 1 refills | 10 days | Status: CP | PRN
Start: 2021-08-27 — End: 2021-09-06

## 2021-08-27 MED ORDER — BICTEGRAVIR 50 MG-EMTRICITABINE 200 MG-TENOFOVIR ALAFENAM 25 MG TABLET
ORAL_TABLET | Freq: Every day | ORAL | 1 refills | 90.00000 days | Status: CP
Start: 2021-08-27 — End: 2022-02-23
  Filled 2021-10-15: qty 90, 90d supply, fill #0

## 2021-08-27 NOTE — Unmapped (Addendum)
Thank you for coming in today.    Please note that your laboratory and other results may be visible to you in real time, possibly before they reach your provider. Please allow 48 hours for clinical interpretation of these results. Importantly, even if a result is flagged as abnormal, it may not be one that impacts your health.    The ID clinic phone number is [phone] (toll free [phone]).  The ID clinic fax number is [phone].    For urgent issues on nights and weekends you may reach the ID Physician on call through the [facility] Operator at [phone].     Marin Shutter, MD  [facility] Infectious Diseases Clinic at [facility]  [address]   [place], [place] [address]  Phone: [phone]   Fax: [phone]

## 2021-08-27 NOTE — Unmapped (Signed)
INFECTIOUS DISEASES CLINIC  [address]  [place], [place]  [address]  P [phone]  F [phone]     Primary care provider: Karie Chimera, MD     I personally spent 90 minutes face-to-face and non-face-to-face in the care of this patient, which includes all pre, intra, and post visit time on the date of service.  All documented time was specific to the E/M visit and does not include any procedures that may have been performed.    Assessment/Plan:      Brandy Demedeiros [facility] Christell Constant) is a 31 y.o. female seen today for HIV followup after engaging in care at [facility] 03/2021.    HIV - advanced, uncontrolled (dx'd 2013, nadir CD4 26 (2%) 03/2021)  - chronic, severe   Candida esophagitis prior to therapy restart  Current regimen: Biktarvy (BIC/FTC/TAF)  Misses doses of ARVs never  Med access via Medicaid  CD4 count below 200 but is on ART; needs primary prophylaxis against PCP and toxo until CD4 > 200 for >77m (anticipate labs today will show stability)  Discussed ARV adherence and continuing prophylaxis for PJP    Lab Results   Component Value Date    ACD4 243 (L) 06/11/2021    CD4 9 (L) 06/11/2021    HIVRS Detected (A) 06/11/2021    HIVCP <20 (H) 06/11/2021     ??? HIV RNA and safety labs (brief return panel)  ??? Continue current therapy  ??? Encouraged continued excellent    Anemia   Lab Results   Component Value Date    HGB 8.4 (L) 06/11/2021     ??? Lab Results   ??? Component ??? Value ??? Date   ???  ??? HCT ??? 26.0 (L) ??? 06/11/2021   ??   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 Discussed secondary prophylaxis, prefers to continue episodic treatment.  ?? Prescription for valacyclovir 1g BID for 7-10d during outbreaks    Pelvic pain  Interstitial cystitis  Difficulty walking  - Follows with [facility] pain med (seen 5/26, on gabapentin and buprenorphine), Urology (seen 5/22, on bladder instillation w/ sensorcaine and Elmiron), and pelvic floor physical therapy (seen 5/25)  - She is engaged in care for this with other providers but I share my colleague's concern about myopathy related to uncontrolled HIV, supported by improvement (discussed with patient)  ?? Monitor on ART    Hypertension  - Reports this is chronic  - Has PCP televisit next week; advised follow up of HTN with consideration of home monitoring    Sexual health & secondary prevention  We have discussed U=U.     Lab Results   Component Value Date    RPR Reactive (A) 04/23/2021    RPR 1:4 04/23/2021    CTNAA Negative 04/23/2021    GCNAA Negative 04/23/2021    SPECSOURCE Urine 04/23/2021     ??? GC/CT NAATs -  Obtained at recent visit  ??? RPR - repeat 6 months after prior (has been serofast at 1:4, initial dx 2013)    Health maintenance   Oral health  She does  have a dentist. Last dental exam within the past year.    Eye health  She does  use corrective lenses. Last eye exam  within the past year.    Metabolic conditions  Wt Readings from Last 5 Encounters:   06/11/21 (P) 64.9 kg (143 lb)     Lab Results   Component Value Date    CREATININE 0.65 06/11/2021    PROTEINUA 70 mg/dL (A) 09/81/1914    GLUCOSEU Trace (A) 09/24/2020    GLU 82 06/11/2021    ALT <7 (L) 06/11/2021    ALT <7 (L) 04/23/2021     # Kidney health - creatinine today  # Bone health - assessment not yet needed (under age 33)  # Diabetes assessment - A1c needed but deferred to future visit  # NAFLD assessment - suspicion for NAFLD low    Communicable diseases  No results found for: QFTTBGOLD, QTBG, HEPAIGG, HEPBSAG, HEPBSAB, HEPCAB, HCVRNA, HCVRNAIU, HCVR, HCVIU, RUBEOL, MUMPSIGG, RUBIG, VZVIGG  # TB screening - assessment needed but deferred to future visit  # Hepatitis screening -  obtained prior, 2013 -- Hep A IgG and Hep B sAb pos and assessment(s) needed but deferred to future visit (HCV)  # MMR screening - assessment(s) needed but deferred to future visit    Cancer screening  No results found for: PSASCRN, PSA, PAP, FINALDX  # Cervical - assessment(s) needed but deferred to future visit (discussed elevated risk of cancer; patient says vaginal penetration remains too painful for exam but improving w/ pelvic PT)  # Breast - no indication for screening at present    # Anorectal - not yet done  # Colorectal - screening not indicated  # Liver - no screening indicated  # Lung - screening not indicated    Cardiovascular disease  No results found for: CHOL, HDL, LDL, NONHDL, TRIG  # The ASCVD Risk score (Arnett DK, et al., 2019) failed to calculate.  - is not taking aspirin   - is not taking statin  - BP control good  - never smoker  # AAA screening - no indication for screening    Immunization History   Administered Date(s) Administered   ??? COVID-19 VAC,BIVALENT(38YR UP),PFIZER 06/11/2021   ??? COVID-19 VACC,MRNA,(PFIZER)(PF) 07/07/2019, 08/01/2019   ??? DTP 01/05/1991, 06/02/1994   ??? HPV Quadrivalent (Gardasil) 04/15/2015   ??? Haemophilis Influenza Type B Vaccine Hboc 09/16/1990   ??? Hepatitis B Vaccine, Unspecified Formulation 08/03/2001, 09/14/2001, 06/26/2002   ??? HiB, unspecified 08/05/1989, 10/07/1989, 12/03/1989, 01/05/1991   ??? INFLUENZA TIV (TRI) 6MO+ W/ PRESERV (IM) 01/12/2012   ??? Influenza Vaccine Quad (IIV4 PF) 30mo+ injectable 02/13/2013, 02/11/2017   ??? Influenza Virus Vaccine, unspecified formulation 11/28/2014   ??? MMR 09/16/1990, 06/02/1994   ??? OPV 06/02/1994   ??? PNEUMOCOCCAL POLYSACCHARIDE 23 09/01/2011   ??? Polio Virus Vaccine, Unspecified Formulation 08/12/1989, 10/07/1989, 12/03/1989, 11/15/1990   ??? Td (adult) unspecified formulation 08/03/2001   ??? TdaP 04/16/2017     ??? Immunizations today -  PCV20 . Would add VZV when CD4 is higher, HPV. Discussed vaccination recommendations at length.    Disposition  Return to clinic 3-4 months or sooner if needed.         Subjective:      Chief Complaint   HIV followup    HPI  Return patient visit for Brandy Evans, a 31 y.o. female. Recently restarted ART (TAF/FTC/BIC) 03/2021.     HIV history:  Diagnosed 2013 on routine pregnancy screening, previously followed by [facility]  03/21/2015 genotype no resistance (integrase resistance not included)  01/29/2017 genotype - per provider notes E138A (R-rilpivirine)  Previous ARVs: TDF/FTC +  atazanavir/ritonavir (~2014)  TAF/FTC + darunavir/cobicistat (Prescobix) (2017)  TDF/FTC + dolutegravir  TAF/FTC + dolutegravir most recently, per patient into late 2021 when former providers continued to prescribe  Last seen in [facility] 06/11/2017  Multiple months off medications at different junctures, major barrier to care has been privacy (bad experiences w/ indiscreet healthcare systems previously) and not wanting to call to make appointments. Patient was reengaged in care via [facility] in early 2023.    Interval history:    Here with Mom Leta    Reports that she is able to walk better and thinks plausible that uncontrolled HIV was part of syndrome of pain and weakness and exacerbating interstitial cystitis symptoms    No fevers or chills  No rashes  No lymphadenopathy she has noticed  No rashes  Has good new pelvic floor PT whom she likes    ROS: no oral lesions, no dysphagia/odynophagia, no cough or trouble breathing    Past Medical History:   Diagnosis Date   ??? Chronic interstitial cystitis    ??? Genital herpes simplex type 1 infection    ??? History of chlamydia 01/2013   ??? History of COVID-19 2022   ??? History of syphilis 01/2012    - 02/09/2012 RPR 1:64 (CareEverywhere), s/p pen G x3 (11/26, 12/3, 03/15/2012)   ??? HIV (human immunodeficiency virus infection) (CMS-HCC) 2013    Diagnosed 2013 on routine pregnancy screening, previously followed by [facility]   ??? Sickle cell trait (CMS-HCC)      Medications and Allergies   Reviewed and updated today. See bottom of this visit's encounter summary for details.  Current Outpatient Medications on File Prior to Visit   Medication Sig   ??? bictegrav-emtricit-tenofov ala (BIKTARVY) 50-200-25 mg tablet Take 1 tablet by mouth daily.   ??? [EXPIRED] bictegrav-emtricit-tenofov ala (BIKTARVY) 50-200-25 mg tablet Take 1 tablet by mouth daily.   ??? calcium carbonate (TUMS ORAL) Take by mouth.   ??? gabapentin (NEURONTIN) 100 MG capsule Take 1 capsule (100 mg total) by mouth Three (3) times a day. (Patient taking differently: Take 800 mg by mouth Three (3) times a day.)   ??? gabapentin (NEURONTIN) 800 MG tablet Take 800 mg by mouth Three (3) times a day.   ??? HYDROcodone-acetaminophen (NORCO) 5-325 mg per tablet Take 1 tablet by mouth daily as needed for pain.   ??? hydrOXYzine (ATARAX) 25 MG tablet Take 1 tablet (25 mg total) by mouth Three (3) times a day.   ??? oxybutynin (DITROPAN) 5 MG tablet Take 1 tablet (5 mg total) by mouth Three (3) times a day as needed.   ??? pentosan polysulfate (ELMIRON) 100 mg capsule Take 1 capsule (100 mg total) by mouth Three (3) times a day before meals.   ??? sulfamethoxazole-trimethoprim (BACTRIM DS) 800-160 mg per tablet Take 1 tablet (160 mg of trimethoprim total) by mouth daily.   ??? tiZANidine (ZANAFLEX) 4 MG tablet Take 1 tablet (4 mg total) by mouth every twelve (12) hours as needed.   ??? valACYclovir (VALTREX) 1000 MG tablet Take 1 tablet (1000 mg) by mouth two (2) times a day for 7-10 days.     No current facility-administered medications on file prior to visit.     No Known Allergies    Social History  Lives in [place] at elementary school, on medical leave since May 2022  Assistant middle school cheerleading coach  Boys born 2014, 2019, both HIV negative  Housing -  in apartment with family -- mother and children  School / Work & Benefits - medical leave  Tobacco - never smoked tobacco (occasional hookah, none since 08/2020)  Alcohol - never drinks alcohol  Substance use - none  Social History     Tobacco Use   ??? Smoking status: Former   ??? Smokeless tobacco: Never   Substance Use Topics   ??? Alcohol use: Never     Review of Systems  As per HPI. Remainder of 10 systems reviewed, negative.        Objective:      BP 153/115 (BP Site: L Arm, BP Position: Sitting, BP Cuff Size: Medium)  - Pulse 65  - Temp 36.2 ??C (97.1 ??F) (Tympanic)  - Ht 162.6 cm (5' 4)  - Wt 77.6 kg (171 lb)  - BMI 29.35 kg/m??   Wt Readings from Last 3 Encounters:   06/11/21 (P) 64.9 kg (143 lb)     Const looks well and attentive, alert, appropriate; face filled out   Eyes sclerae anicteric, noninjected OU   ENT no thrush, leukoplakia or oral lesions Linea alba bilaterally   Lymph no cervical or supraclavicular LAD   CV RRR. No murmurs. No rub or gallop. S1/S2. No peripheral edema   Lungs CTAB ant/post, normal work of breathing and no clubbing or cyanosis   GI Soft, nondistended. NABS.   GU deferred   Rectal deferred   Skin no petechiae, ecchymoses or obvious rashes on clothed exam   MSK no joint tenderness   Neuro CN II-XII intact and gait not tested   Psych Appropriate affect. Eye contact good. Linear thoughts. Fluent speech.     Laboratory Data  Reviewed in Epic today, using Synopsis and Chart Review filters.    Lab Results   Component Value Date    CREATININE 0.65 06/11/2021

## 2021-08-28 DIAGNOSIS — M62838 Other muscle spasm: Secondary | ICD-10-CM | POA: Diagnosis not present

## 2021-08-28 DIAGNOSIS — R102 Pelvic and perineal pain: Secondary | ICD-10-CM | POA: Diagnosis not present

## 2021-08-28 DIAGNOSIS — N9489 Other specified conditions associated with female genital organs and menstrual cycle: Secondary | ICD-10-CM | POA: Diagnosis not present

## 2021-08-28 DIAGNOSIS — M6281 Muscle weakness (generalized): Secondary | ICD-10-CM | POA: Diagnosis not present

## 2021-08-28 LAB — LYMPH MARKER LIMITED,FLOW
ABSOLUTE CD3 CNT: 1305 {cells}/uL (ref 915–3400)
ABSOLUTE CD4 CNT: 180 {cells}/uL — ABNORMAL LOW (ref 510–2320)
ABSOLUTE CD8 CNT: 1095 {cells}/uL (ref 180–1520)
CD3% (T CELLS): 87 % — ABNORMAL HIGH (ref 61–86)
CD4% (T HELPER): 12 % — ABNORMAL LOW (ref 34–58)
CD4:CD8 RATIO: 0.2 — ABNORMAL LOW (ref 0.9–4.8)
CD8% T SUPPRESR: 73 % — ABNORMAL HIGH (ref 12–38)

## 2021-08-28 LAB — HIV RNA, QUANTITATIVE, PCR: HIV RNA QNT RSLT: NOT DETECTED

## 2021-08-28 NOTE — Unmapped (Signed)
Patient received pneumococcal 20 valent tolerated well  No c/c of adverse reations

## 2021-08-28 NOTE — Unmapped (Signed)
[facility] has made a second and final attempt to reach this patient to refill the following medication: Biktarvy.      We have left voicemails on the following phone numbers: [phone], have been unable to leave messages on the following phone numbers: [phone] and have sent a MyChart message.    Dates contacted: 5/23, 6/1  Last scheduled delivery: shipped 4/26    The patient may be at risk of non-compliance with this medication. The patient should call the [facility] at [phone]  Option 4, then Option 2 (all other specialty patients) to refill medication.    Westley Gambles   [facility] Specialty Technician

## 2021-08-28 NOTE — Unmapped (Signed)
Teaching Physician Note     I saw and evaluated Brandy Evans, participating in the key portions of the service. I reviewed the note by Dr. Lelon Perla. I agree with the documented findings and plan.      I personally reviewed this patient's lab results independently, microbiology data independently, and current medications independently and I agree with the findings as reported.    Amparo Bristol, MD, MPH   [facility]

## 2021-09-05 DIAGNOSIS — R21 Rash and other nonspecific skin eruption: Secondary | ICD-10-CM | POA: Diagnosis not present

## 2021-09-05 DIAGNOSIS — L709 Acne, unspecified: Secondary | ICD-10-CM | POA: Diagnosis not present

## 2021-09-08 DIAGNOSIS — N3010 Interstitial cystitis (chronic) without hematuria: Secondary | ICD-10-CM | POA: Diagnosis not present

## 2021-09-10 DIAGNOSIS — N3010 Interstitial cystitis (chronic) without hematuria: Secondary | ICD-10-CM | POA: Diagnosis not present

## 2021-09-11 MED FILL — BIKTARVY 50 MG-200 MG-25 MG TABLET: ORAL | 30 days supply | Qty: 30 | Fill #3

## 2021-09-11 NOTE — Unmapped (Signed)
[facility] Refill Coordination Note    Specialty Medication(s) to be Shipped:   Infectious Disease: Biktarvy    Other medication(s) to be shipped: No additional medications requested for fill at this time     Brandy Evans, DOB: [birth year]  Phone: [phone] (home)       All above HIPAA information was verified with patient.     Was a Nurse, learning disability used for this call? No    Completed refill call assessment today to schedule patient's medication shipment from the [facility] [phone]).  All relevant notes have been reviewed.     Specialty medication(s) and dose(s) confirmed: Regimen is correct and unchanged.   Changes to medications: Brandy Evans reports no changes at this time.  Changes to insurance: No  New side effects reported not previously addressed with a pharmacist or physician: None reported  Questions for the pharmacist: No    Confirmed patient received a Conservation officer, historic buildings and a Surveyor, mining with first shipment. The patient will receive a drug information handout for each medication shipped and additional FDA Medication Guides as required.       DISEASE/MEDICATION-SPECIFIC INFORMATION        N/A    SPECIALTY MEDICATION ADHERENCE     Medication Adherence    Patient reported X missed doses in the last month: 0  Specialty Medication: Biktarvy 50-200-25 mg  Informant: patient         Were doses missed due to medication being on hold?  N/A    Biktarvy 50-200-25  mg: 2-3 days of medicine on hand       REFERRAL TO PHARMACIST     Referral to the pharmacist: Not needed      [facility]     Shipping address confirmed in Epic.     Delivery Scheduled: Yes, Expected medication delivery date: 6/16.     Medication will be delivered via UPS to the prescription address in Epic WAM.    Brandy Evans Nurse  [facility] Specialty Pharmacist

## 2021-09-15 DIAGNOSIS — N3010 Interstitial cystitis (chronic) without hematuria: Secondary | ICD-10-CM | POA: Diagnosis not present

## 2021-09-25 DIAGNOSIS — N9489 Other specified conditions associated with female genital organs and menstrual cycle: Secondary | ICD-10-CM | POA: Diagnosis not present

## 2021-09-25 DIAGNOSIS — R102 Pelvic and perineal pain: Secondary | ICD-10-CM | POA: Diagnosis not present

## 2021-09-25 DIAGNOSIS — M6281 Muscle weakness (generalized): Secondary | ICD-10-CM | POA: Diagnosis not present

## 2021-09-25 DIAGNOSIS — M62838 Other muscle spasm: Secondary | ICD-10-CM | POA: Diagnosis not present

## 2021-10-06 DIAGNOSIS — N3010 Interstitial cystitis (chronic) without hematuria: Secondary | ICD-10-CM | POA: Diagnosis not present

## 2021-10-08 DIAGNOSIS — N3010 Interstitial cystitis (chronic) without hematuria: Secondary | ICD-10-CM | POA: Diagnosis not present

## 2021-10-13 DIAGNOSIS — M6281 Muscle weakness (generalized): Secondary | ICD-10-CM | POA: Diagnosis not present

## 2021-10-13 DIAGNOSIS — N3010 Interstitial cystitis (chronic) without hematuria: Secondary | ICD-10-CM | POA: Diagnosis not present

## 2021-10-13 DIAGNOSIS — R102 Pelvic and perineal pain: Secondary | ICD-10-CM | POA: Diagnosis not present

## 2021-10-13 DIAGNOSIS — N9489 Other specified conditions associated with female genital organs and menstrual cycle: Secondary | ICD-10-CM | POA: Diagnosis not present

## 2021-10-13 DIAGNOSIS — M62838 Other muscle spasm: Secondary | ICD-10-CM | POA: Diagnosis not present

## 2021-10-15 DIAGNOSIS — N3010 Interstitial cystitis (chronic) without hematuria: Secondary | ICD-10-CM | POA: Diagnosis not present

## 2021-10-15 NOTE — Unmapped (Signed)
[facility] Clinical Assessment & Refill Coordination Note    Brandy Evans, DOB: 1991-02-18  Phone: [phone] (home)     All above HIPAA information was verified with patient.     Was a Nurse, learning disability used for this call? No    Specialty Medication(s):   Infectious Disease: Biktarvy     Current Outpatient Medications   Medication Sig Dispense Refill    bictegrav-emtricit-tenofov ala (BIKTARVY) 50-200-25 mg tablet Take 1 tablet by mouth daily. 90 tablet 1    buprenorphine 10 mcg/hour PTWK transdermal patch Place 1 patch on the skin every seven (7) days. 4 patch 2    calcium carbonate (TUMS ORAL) Take by mouth.      gabapentin (NEURONTIN) 100 MG capsule Take 1 capsule (100 mg total) by mouth Three (3) times a day. (Patient taking differently: Take 800 mg by mouth Three (3) times a day.) 90 capsule 0    gabapentin (NEURONTIN) 800 MG tablet Take 1 tablet (800 mg total) by mouth Three (3) times a day.      HYDROcodone-acetaminophen (NORCO) 5-325 mg per tablet Take 1 tablet by mouth daily as needed for pain.      hydrocortisone 2.5 % ointment APPLY 1 APPLICATION TWICE A DAY      hydrOXYzine (ATARAX) 25 MG tablet Take 1 tablet (25 mg total) by mouth Three (3) times a day.      nystatin (MYCOSTATIN) 100,000 unit/mL suspension 5 mL p.o. 4 times daily swish and spit or swallow      oxybutynin (DITROPAN) 5 MG tablet Take 1 tablet (5 mg total) by mouth Three (3) times a day as needed.      pentosan polysulfate (ELMIRON) 100 mg capsule Take 1 capsule (100 mg total) by mouth Three (3) times a day before meals.      tiZANidine (ZANAFLEX) 4 MG tablet Take 1 tablet (4 mg total) by mouth every twelve (12) hours as needed.       No current facility-administered medications for this visit.        Changes to medications: Tahjanae reports no changes at this time.    No Known Allergies    Changes to allergies: No    SPECIALTY MEDICATION ADHERENCE     Biktarvy 50-200-25 mg: unable to verify the number of days of medicine on hand.  She said she has less than a week.      Medication Adherence    Patient reported X missed doses in the last month: 0  Specialty Medication: Biktarvy 50-200-25mg   Patient is on additional specialty medications: No  Any gaps in refill history greater than 2 weeks in the last 3 months: no  Demonstrates understanding of importance of adherence: yes  Informant: patient  Provider-estimated medication adherence level: good  Patient is at risk for Non-Adherence: No          Specialty medication(s) dose(s) confirmed: Regimen is correct and unchanged.     Are there any concerns with adherence? No    Adherence counseling provided? Not needed    CLINICAL MANAGEMENT AND INTERVENTION      Clinical Benefit Assessment:    Do you feel the medicine is effective or helping your condition? Yes    HIV ASSOCIATED LABS:     Lab Results   Component Value Date/Time    HIVRS Not Detected 08/27/2021 04:59 PM    HIVRS Detected (A) 06/11/2021 12:32 PM    HIVRS Detected (A) 04/23/2021 02:50 PM    HIVCP <  20 (H) 06/11/2021 12:32 PM    HIVCP 342,345 (H) 04/23/2021 02:50 PM    ACD4 180 (L) 08/27/2021 04:59 PM    ACD4 243 (L) 06/11/2021 12:32 PM    ACD4 26 (L) 04/23/2021 02:50 PM       Clinical Benefit counseling provided? Labs from 08/27/21 show evidence of clinical benefit    Adverse Effects Assessment:    Are you experiencing any side effects? No    Are you experiencing difficulty administering your medicine? No    Quality of Life Assessment:      How many days over the past month did your HIV  keep you from your normal activities? For example, brushing your teeth or getting up in the morning. 0    Have you discussed this with your provider? Not needed    Acute Infection Status:    Acute infections noted within Epic:  No active infections  Patient reported infection: None    Therapy Appropriateness:    Is therapy appropriate and patient progressing towards therapeutic goals? Yes, therapy is appropriate and should be continued    DISEASE/MEDICATION-SPECIFIC INFORMATION      N/A    PATIENT SPECIFIC NEEDS     Does the patient have any physical, cognitive, or cultural barriers? No    Is the patient high risk? No    Does the patient require a Care Management Plan? No       SHIPPING     Specialty Medication(s) to be Shipped:   Infectious Disease: Biktarvy    Other medication(s) to be shipped: No additional medications requested for fill at this time     Changes to insurance: No    Delivery Scheduled: Yes, Expected medication delivery date: 10/16/21.     Medication will be delivered via UPS to the confirmed prescription address in [facility].    The patient will receive a drug information handout for each medication shipped and additional FDA Medication Guides as required.  Verified that patient has previously received a Conservation officer, historic buildings and a Surveyor, mining.    The patient or caregiver noted above participated in the development of this care plan and knows that they can request review of or adjustments to the care plan at any time.      All of the patient's questions and concerns have been addressed.    Roderic Palau   [facility] Specialty Pharmacist

## 2021-10-16 DIAGNOSIS — M6281 Muscle weakness (generalized): Secondary | ICD-10-CM | POA: Diagnosis not present

## 2021-10-16 DIAGNOSIS — N9489 Other specified conditions associated with female genital organs and menstrual cycle: Secondary | ICD-10-CM | POA: Diagnosis not present

## 2021-10-16 DIAGNOSIS — M62838 Other muscle spasm: Secondary | ICD-10-CM | POA: Diagnosis not present

## 2021-10-16 DIAGNOSIS — R102 Pelvic and perineal pain: Secondary | ICD-10-CM | POA: Diagnosis not present

## 2021-10-24 DIAGNOSIS — R102 Pelvic and perineal pain: Secondary | ICD-10-CM | POA: Diagnosis not present

## 2021-10-24 DIAGNOSIS — Z79899 Other long term (current) drug therapy: Secondary | ICD-10-CM | POA: Diagnosis not present

## 2021-10-28 DIAGNOSIS — N9489 Other specified conditions associated with female genital organs and menstrual cycle: Secondary | ICD-10-CM | POA: Diagnosis not present

## 2021-10-28 DIAGNOSIS — M62838 Other muscle spasm: Secondary | ICD-10-CM | POA: Diagnosis not present

## 2021-10-28 DIAGNOSIS — R102 Pelvic and perineal pain: Secondary | ICD-10-CM | POA: Diagnosis not present

## 2021-10-28 DIAGNOSIS — M6281 Muscle weakness (generalized): Secondary | ICD-10-CM | POA: Diagnosis not present

## 2021-10-29 DIAGNOSIS — N3010 Interstitial cystitis (chronic) without hematuria: Secondary | ICD-10-CM | POA: Diagnosis not present

## 2021-11-03 DIAGNOSIS — N3010 Interstitial cystitis (chronic) without hematuria: Secondary | ICD-10-CM | POA: Diagnosis not present

## 2021-11-05 DIAGNOSIS — N3010 Interstitial cystitis (chronic) without hematuria: Secondary | ICD-10-CM | POA: Diagnosis not present

## 2021-11-21 DIAGNOSIS — R102 Pelvic and perineal pain: Secondary | ICD-10-CM | POA: Diagnosis not present

## 2021-11-21 DIAGNOSIS — Z79899 Other long term (current) drug therapy: Secondary | ICD-10-CM | POA: Diagnosis not present

## 2021-12-22 DIAGNOSIS — N3010 Interstitial cystitis (chronic) without hematuria: Secondary | ICD-10-CM | POA: Diagnosis not present

## 2021-12-24 DIAGNOSIS — N3010 Interstitial cystitis (chronic) without hematuria: Secondary | ICD-10-CM | POA: Diagnosis not present

## 2021-12-25 DIAGNOSIS — N3010 Interstitial cystitis (chronic) without hematuria: Secondary | ICD-10-CM | POA: Diagnosis not present

## 2021-12-25 DIAGNOSIS — R102 Pelvic and perineal pain: Secondary | ICD-10-CM | POA: Diagnosis not present

## 2022-01-15 NOTE — Unmapped (Signed)
[facility] Refill Coordination Note    Specialty Medication(s) to be Shipped:   Infectious Disease: Biktarvy    Other medication(s) to be shipped: No additional medications requested for fill at this time     Brandy Evans, DOB: 07/02/1990  Phone: [phone] (home)       All above HIPAA information was verified with patient.     Was a Nurse, learning disability used for this call? No    Completed refill call assessment today to schedule patient's medication shipment from the [facility] [phone]).  All relevant notes have been reviewed.     Specialty medication(s) and dose(s) confirmed: Regimen is correct and unchanged.   Changes to medications: Brandy Evans reports no changes at this time.  Changes to insurance: No  New side effects reported not previously addressed with a pharmacist or physician: None reported  Questions for the pharmacist: No    Confirmed patient received a Conservation officer, historic buildings and a Surveyor, mining with first shipment. The patient will receive a drug information handout for each medication shipped and additional FDA Medication Guides as required.       DISEASE/MEDICATION-SPECIFIC INFORMATION        N/A    SPECIALTY MEDICATION ADHERENCE     Medication Adherence    Patient reported X missed doses in the last month: 0  Specialty Medication: BIKTARVY 50-200-25 mg                          Were doses missed due to medication being on hold? No    BIKTARVY 50-200-25 mg  : unable to confirm quantity on hand    REFERRAL TO PHARMACIST     Referral to the pharmacist: Not needed      [facility]     Shipping address confirmed in Epic.     Delivery Scheduled: Yes, Expected medication delivery date: 10/23.     Medication will be delivered via UPS to the prescription address in Epic WAM.    Westley Gambles   [facility] Specialty Technician

## 2022-01-16 MED FILL — BIKTARVY 50 MG-200 MG-25 MG TABLET: ORAL | 90 days supply | Qty: 90 | Fill #1

## 2022-03-04 DIAGNOSIS — N3010 Interstitial cystitis (chronic) without hematuria: Secondary | ICD-10-CM | POA: Diagnosis not present

## 2022-03-11 DIAGNOSIS — N3010 Interstitial cystitis (chronic) without hematuria: Secondary | ICD-10-CM | POA: Diagnosis not present

## 2022-03-16 ENCOUNTER — Ambulatory Visit: Admit: 2022-03-16 | Discharge: 2022-03-17 | Payer: BLUE CROSS/BLUE SHIELD

## 2022-03-16 DIAGNOSIS — E876 Hypokalemia: Secondary | ICD-10-CM | POA: Diagnosis not present

## 2022-03-16 DIAGNOSIS — Z21 Asymptomatic human immunodeficiency virus [HIV] infection status: Secondary | ICD-10-CM | POA: Diagnosis not present

## 2022-03-16 DIAGNOSIS — B009 Herpesviral infection, unspecified: Secondary | ICD-10-CM | POA: Diagnosis not present

## 2022-03-16 DIAGNOSIS — Z23 Encounter for immunization: Secondary | ICD-10-CM | POA: Diagnosis not present

## 2022-03-16 DIAGNOSIS — B20 Human immunodeficiency virus [HIV] disease: Secondary | ICD-10-CM | POA: Diagnosis not present

## 2022-03-16 LAB — CBC W/ AUTO DIFF
BASOPHILS ABSOLUTE COUNT: 0 10*9/L (ref 0.0–0.1)
BASOPHILS RELATIVE PERCENT: 0.5 %
EOSINOPHILS ABSOLUTE COUNT: 0 10*9/L (ref 0.0–0.5)
EOSINOPHILS RELATIVE PERCENT: 0.9 %
HEMATOCRIT: 31.5 % — ABNORMAL LOW (ref 34.0–44.0)
HEMOGLOBIN: 10 g/dL — ABNORMAL LOW (ref 11.3–14.9)
LYMPHOCYTES ABSOLUTE COUNT: 1.5 10*9/L (ref 1.1–3.6)
LYMPHOCYTES RELATIVE PERCENT: 30.8 %
MEAN CORPUSCULAR HEMOGLOBIN CONC: 31.6 g/dL — ABNORMAL LOW (ref 32.0–36.0)
MEAN CORPUSCULAR HEMOGLOBIN: 21.8 pg — ABNORMAL LOW (ref 25.9–32.4)
MEAN CORPUSCULAR VOLUME: 69 fL — ABNORMAL LOW (ref 77.6–95.7)
MEAN PLATELET VOLUME: 8 fL (ref 6.8–10.7)
MONOCYTES ABSOLUTE COUNT: 0.4 10*9/L (ref 0.3–0.8)
MONOCYTES RELATIVE PERCENT: 8.2 %
NEUTROPHILS ABSOLUTE COUNT: 3 10*9/L (ref 1.8–7.8)
NEUTROPHILS RELATIVE PERCENT: 59.6 %
PLATELET COUNT: 299 10*9/L (ref 150–450)
RED BLOOD CELL COUNT: 4.56 10*12/L (ref 3.95–5.13)
RED CELL DISTRIBUTION WIDTH: 18 % — ABNORMAL HIGH (ref 12.2–15.2)
WBC ADJUSTED: 5 10*9/L (ref 3.6–11.2)

## 2022-03-16 LAB — LIPID PANEL
CHOLESTEROL/HDL RATIO SCREEN: 3.2 (ref 1.0–4.5)
CHOLESTEROL: 118 mg/dL (ref <=200)
HDL CHOLESTEROL: 37 mg/dL — ABNORMAL LOW (ref 40–60)
LDL CHOLESTEROL CALCULATED: 45 mg/dL (ref 40–99)
NON-HDL CHOLESTEROL: 81 mg/dL (ref 70–130)
TRIGLYCERIDES: 180 mg/dL — ABNORMAL HIGH (ref 0–150)
VLDL CHOLESTEROL CAL: 36 mg/dL — ABNORMAL HIGH (ref 8–32)

## 2022-03-16 LAB — BASIC METABOLIC PANEL
ANION GAP: 9 mmol/L (ref 5–14)
BLOOD UREA NITROGEN: 8 mg/dL — ABNORMAL LOW (ref 9–23)
BUN / CREAT RATIO: 10
CALCIUM: 9.8 mg/dL (ref 8.7–10.4)
CHLORIDE: 102 mmol/L (ref 98–107)
CO2: 26.9 mmol/L (ref 20.0–31.0)
CREATININE: 0.78 mg/dL
EGFR CKD-EPI (2021) FEMALE: >90 mL/min/{1.73_m2} (ref >=60)
GLUCOSE RANDOM: 113 mg/dL (ref 70–179)
POTASSIUM: 3.2 mmol/L — ABNORMAL LOW (ref 3.4–4.8)
SODIUM: 138 mmol/L (ref 135–145)

## 2022-03-16 LAB — HEMOGLOBIN A1C
ESTIMATED AVERAGE GLUCOSE: 117 mg/dL
HEMOGLOBIN A1C: 5.7 % — ABNORMAL HIGH (ref 4.8–5.6)

## 2022-03-16 LAB — ALT: ALT (SGPT): <7 U/L — ABNORMAL LOW (ref 10–49)

## 2022-03-16 LAB — AST: AST (SGOT): 14 U/L (ref <=34)

## 2022-03-16 LAB — BILIRUBIN, TOTAL: BILIRUBIN TOTAL: 0.3 mg/dL (ref 0.3–1.2)

## 2022-03-16 MED ORDER — BICTEGRAVIR 50 MG-EMTRICITABINE 200 MG-TENOFOVIR ALAFENAM 25 MG TABLET
ORAL_TABLET | Freq: Every day | ORAL | 3 refills | 90 days | Status: CP
Start: 2022-03-16 — End: 2023-03-16

## 2022-03-16 NOTE — Unmapped (Addendum)
INFECTIOUS DISEASES CLINIC  [address]  [place], [place]  [address]  P [phone]  F [phone]     Primary care provider: Karie Chimera, MD     I personally spent 45 minutes face-to-face and non-face-to-face in the care of this patient, which includes all pre, intra, and post visit time on the date of service.  All documented time was specific to the E/M visit and does not include any procedures that may have been performed.    Assessment/Plan:      Brandy Evans [facility] Christell Constant) is a 31 y.o. female seen today for HIV followup after engaging in care at [facility] 03/2021.    HIV - advanced, uncontrolled (dx'd 2013, nadir CD4 26 (2%) 03/2021)  - chronic, severe   Candida esophagitis prior to therapy restart  Current regimen: Biktarvy (BIC/FTC/TAF)  Misses doses of ARVs never  Med access via Medicaid  CD4 count over 100 and VL suppressed on ART >37m; primary prophylaxis against PCP and toxo no longer needed (stopped after last visit 07/2021)  Discussed ARV adherence  Lab Results   Component Value Date    ACD4 180 (L) 08/27/2021    CD4 12 (L) 08/27/2021    HIVRS Not Detected 08/27/2021    HIVCP <20 (H) 06/11/2021     CD4, HIV RNA, and safety labs (full return panel)  Continue current therapy refill provided  Encouraged continued excellent    ADDENDUM:   K returned low at 3.2, has been normal in the past; Cr stable; BUn and ALT have been low for quite some time; no new diarrhea or n/v; suspect dietary intake may be playing a role-have advised her to increase intake of potassium rich foods and will add on magnesium level to yesterdays labs; will monitor and repeat at next visit, if persistently low will undertake additional wu in conjunction with PCP    Anemia   Lab Results   Component Value Date    HGB 8.9 (L) 08/27/2021     Lab Results   Component Value Date    HCT 28.6 (L) 08/27/2021     - She has a history of sickle cell trait and chronic anemia  - This is also concerning in light of uncontrolled HIV (MAI) - will monitor    History of genital HSV-1 infection, 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 Discussed secondary prophylaxis, prefers to continue episodic treatment. None   Prescription for valacyclovir 1g BID for 7-10d during outbreaks    Pelvic pain  Interstitial cystitis  Difficulty walking  - Follows with [facility] pain med (seen 5/26, on gabapentin and buprenorphine), Urology (seen 5/22, on bladder instillation w/ sensorcaine and Elmiron), and pelvic floor physical therapy (seen 5/25)  improved    Hypertension  - Reports this is chronic, asymptomatic      Sexual health & secondary prevention  We have discussed U=U.     Lab Results   Component Value Date    RPR Reactive (A) 04/23/2021    RPR 1:4 04/23/2021    CTNAA Negative 04/23/2021    GCNAA Negative 04/23/2021    SPECSOURCE Urine 04/23/2021     GC/CT NAATs -  Obtained at recent visit  RPR - repeat 6 months after prior (has been serofast at 1:4, initial dx 2013)    Health maintenance   Oral health  She does  have a dentist. Last dental exam within the past year.    Eye  health  She does  use corrective lenses. Last eye exam within the past year.    Metabolic conditions  Wt Readings from Last 5 Encounters:   08/27/21 77.6 kg (171 lb)   06/11/21 (P) 64.9 kg (143 lb)     Lab Results   Component Value Date    CREATININE 0.85 (H) 08/27/2021    PROTEINUA 70 mg/dL (A) 16/12/9602    GLUCOSEU Trace (A) 09/24/2020    GLU 64 (L) 08/27/2021    ALT <7 (L) 06/11/2021    ALT <7 (L) 04/23/2021     # Kidney health - creatinine today  # Bone health - assessment not yet needed (under age 59)  # Diabetes assessment - A1c needed but deferred to future visit  # NAFLD assessment - suspicion for NAFLD low    Communicable diseases  No results found for: QFTTBGOLD, QTBG, HEPAIGG, HEPBSAG, HEPBSAB, HEPCAB, HCVRNA, HCVRNAIU, HCVR, HCVIU, RUBEOL, MUMPSIGG, RUBIG, VZVIGG  # TB screening - IGRA today  # Hepatitis screening - HAV IgG, HBsAb, HBsAg, and HCV Ab (will reflex to RNA & genotype if reactive)   # MMR screening - assessment(s) needed but deferred to future visit    Cancer screening  No results found for: PSASCRN, PSA, PAP, FINALDX  # Cervical - assessment(s) needed but deferred to future visit (discussed elevated risk of cancer; patient says vaginal penetration remains too painful for exam but improving w/ pelvic PT, briefly discussed asking OB here if EUA is possible, she prefers to wait and see how things go with pelvic PT, which is reasonable)  # Breast - no indication for screening at present    # Anorectal - not yet done  # Colorectal - screening not indicated  # Liver - no screening indicated  # Lung - screening not indicated    Cardiovascular disease  No results found for: CHOL, HDL, LDL, NONHDL, TRIG  # The ASCVD Risk score (Arnett DK, et al., 2019) failed to calculate.  - is not taking aspirin   - is not taking statin  - BP control good  - never smoker  Check A1c, lipids today  # AAA screening - no indication for screening    Immunization History   Administered Date(s) Administered    COVID-19 VAC,BIVALENT(50YR UP),PFIZER 06/11/2021    COVID-19 VACC,MRNA,(PFIZER)(PF) 07/07/2019, 08/01/2019    DTP 01/05/1991, 06/02/1994    HPV Quadrivalent (Gardasil) 04/15/2015    Haemophilis Influenza Type B Vaccine Hboc 09/16/1990    Hepatitis B Vaccine, Unspecified Formulation 08/03/2001, 09/14/2001, 06/26/2002    HiB, unspecified 08/05/1989, 10/07/1989, 12/03/1989, 01/05/1991    INFLUENZA TIV (TRI) 13MO+ W/ PRESERV (IM) 01/12/2012    Influenza Vaccine Quad(IM)6 MO-Adult(PF) 02/13/2013, 02/11/2017    Influenza Virus Vaccine, unspecified formulation 11/28/2014    MMR 09/16/1990, 06/02/1994    OPV 06/02/1994    PNEUMOCOCCAL POLYSACCHARIDE 23-VALENT 09/01/2011    Pneumococcal Conjugate 20-valent 08/27/2021    Polio Virus Vaccine, Unspecified Formulation 08/12/1989, 10/07/1989, 12/03/1989, 11/15/1990    Td (adult) unspecified formulation 08/03/2001    TdaP 04/16/2017     Immunizations today - HPV today (dose 2 of 3); VZV at later visit; recommended COVID spikevax, but patient prefers to have Pfizer only, only have Moderna in clinic  Check Hep A/B titers today    Disposition  Return to clinic 5-6 months or sooner if needed.        Subjective:      Chief Complaint   HIV followup    HPI  Return patient  visit for [employer], a 31 y.o. female. Recently restarted ART (TAF/FTC/BIC) 03/2021.     HIV history:  Diagnosed 2013 on routine pregnancy screening, previously followed by [facility]  03/21/2015 genotype no resistance (integrase resistance not included)  01/29/2017 genotype - per provider notes E138A (R-rilpivirine)  Previous ARVs: TDF/FTC + atazanavir/ritonavir (~2014)  TAF/FTC + darunavir/cobicistat (Prescobix) (2017)  TDF/FTC + dolutegravir  TAF/FTC + dolutegravir most recently, per patient into late 2021 when former providers continued to prescribe  Last seen in [facility] 06/11/2017  Multiple months off medications at different junctures, major barrier to care has been privacy (bad experiences w/ indiscreet healthcare systems previously) and not wanting to call to make appointments. Patient was reengaged in care via [facility] in early 2023.    Interval history:    Saw pelvic pain clinic at end of September-still on gabapentin and buprenorphine as well as muscle relaxers and bladder instillation for IC with urology  Has ups and downs but overall feels significantly better than last year (walking ok)    No recent or ongoing fevers  Living in [place] with her kids, has 2 kids (4 and 43 yo sons)  Smokes hookah but no tobacco or MJ        Past Medical History:   Diagnosis Date    Chronic interstitial cystitis     Genital herpes simplex type 1 infection     History of chlamydia 01/2013    History of COVID-19 2022    History of syphilis 01/2012    - 02/09/2012 RPR 1:64 (CareEverywhere), s/p pen G x3 (11/26, 12/3, 03/15/2012)    HIV (human immunodeficiency virus infection) (CMS-HCC) 2013    Diagnosed 2013 on routine pregnancy screening, previously followed by [facility]    Sickle cell trait (CMS-HCC)      Medications and Allergies   Reviewed and updated today. See bottom of this visit's encounter summary for details.  Current Outpatient Medications on File Prior to Visit   Medication Sig    bictegrav-emtricit-tenofov ala (BIKTARVY) 50-200-25 mg tablet Take 1 tablet by mouth daily.    calcium carbonate (TUMS ORAL) Take by mouth.    gabapentin (NEURONTIN) 100 MG capsule Take 1 capsule (100 mg total) by mouth Three (3) times a day. (Patient taking differently: Take 800 mg by mouth Three (3) times a day.)    gabapentin (NEURONTIN) 800 MG tablet Take 1 tablet (800 mg total) by mouth Three (3) times a day.    HYDROcodone-acetaminophen (NORCO) 5-325 mg per tablet Take 1 tablet by mouth daily as needed for pain.    hydrocortisone 2.5 % ointment APPLY 1 APPLICATION TWICE A DAY    hydrOXYzine (ATARAX) 25 MG tablet Take 1 tablet (25 mg total) by mouth Three (3) times a day.    nystatin (MYCOSTATIN) 100,000 unit/mL suspension 5 mL p.o. 4 times daily swish and spit or swallow    oxybutynin (DITROPAN) 5 MG tablet Take 1 tablet (5 mg total) by mouth Three (3) times a day as needed.    pentosan polysulfate (ELMIRON) 100 mg capsule Take 1 capsule (100 mg total) by mouth Three (3) times a day before meals.    tiZANidine (ZANAFLEX) 4 MG tablet Take 1 tablet (4 mg total) by mouth every twelve (12) hours as needed.     No current facility-administered medications on file prior to visit.     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 Remainder of 10 systems reviewed, negative.        Objective:      There were no vitals taken for this visit.  Wt Readings from Last 3 Encounters:   08/27/21 77.6 kg (171 lb)   06/11/21 (P) 64.9 kg (143 lb)     Const looks well and attentive, alert, appropriate; face filled out   Eyes sclerae anicteric, noninjected OU   ENT no thrush, leukoplakia or oral lesions Linea alba bilaterally   Lymph no cervical or supraclavicular LAD   CV RRR. No murmurs. No rub or gallop. S1/S2. No peripheral edema   Lungs no clubbing or cyanosis and normal WOB   GI Soft, nondistended. NABS.   GU deferred   Rectal deferred   Skin no petechiae, ecchymoses or obvious rashes on clothed exam   MSK no joint tenderness   Neuro CN II-XII intact and gait not tested   Psych Appropriate affect. Eye contact good. Linear thoughts. Fluent speech.     Laboratory Data  Reviewed in Epic today, using Synopsis and Chart Review filters.    Lab Results   Component Value Date    CREATININE 0.85 (H) 08/27/2021

## 2022-03-17 LAB — HIV RNA, QUANTITATIVE, PCR: HIV RNA QNT RSLT: NOT DETECTED

## 2022-03-17 LAB — LYMPH MARKER LIMITED,FLOW
ABSOLUTE CD3 CNT: 1215 {cells}/uL (ref 915–3400)
ABSOLUTE CD4 CNT: 285 {cells}/uL — ABNORMAL LOW (ref 510–2320)
ABSOLUTE CD8 CNT: 900 {cells}/uL (ref 180–1520)
CD3% (T CELLS): 81 % (ref 61–86)
CD4% (T HELPER): 19 % — ABNORMAL LOW (ref 34–58)
CD4:CD8 RATIO: 0.3 — ABNORMAL LOW (ref 0.9–4.8)
CD8% T SUPPRESR: 60 % — ABNORMAL HIGH (ref 12–38)

## 2022-03-17 LAB — MAGNESIUM: MAGNESIUM: 1.8 mg/dL (ref 1.6–2.6)

## 2022-03-17 LAB — HEPATITIS B SURFACE ANTIBODY
HEPATITIS B SURFACE ANTIBODY QUANT: <8 m[IU]/mL (ref <8.00)
HEPATITIS B SURFACE ANTIBODY: NONREACTIVE

## 2022-03-17 LAB — SYPHILIS SCREEN: SYPHILIS RPR SCREEN: REACTIVE — AB

## 2022-03-17 LAB — HEPATITIS A IGG: HEPATITIS A IGG: REACTIVE — AB

## 2022-03-17 LAB — HEPATITIS B SURFACE ANTIGEN: HEPATITIS B SURFACE ANTIGEN: NONREACTIVE

## 2022-03-17 LAB — HEPATITIS C ANTIBODY: HEPATITIS C ANTIBODY: NONREACTIVE

## 2022-03-17 NOTE — Unmapped (Signed)
Addended by: Jorgeluis Gurganus, Amil Amen A. on: 03/17/2022 10:08 AM     Modules accepted: Orders

## 2022-03-18 DIAGNOSIS — N3010 Interstitial cystitis (chronic) without hematuria: Secondary | ICD-10-CM | POA: Diagnosis not present

## 2022-03-19 DIAGNOSIS — N3010 Interstitial cystitis (chronic) without hematuria: Secondary | ICD-10-CM | POA: Diagnosis not present

## 2022-03-19 DIAGNOSIS — Z79899 Other long term (current) drug therapy: Secondary | ICD-10-CM | POA: Diagnosis not present

## 2022-03-19 DIAGNOSIS — R102 Pelvic and perineal pain: Secondary | ICD-10-CM | POA: Diagnosis not present

## 2022-03-19 LAB — TB MITOGEN: TB MITOGEN VALUE: 10

## 2022-03-19 LAB — QUANTIFERON TB GOLD PLUS
QUANTIFERON ANTIGEN 1 MINUS NIL: 0 [IU]/mL
QUANTIFERON ANTIGEN 2 MINUS NIL: 0 [IU]/mL
QUANTIFERON MITOGEN: 9.96 [IU]/mL
QUANTIFERON TB GOLD PLUS: NEGATIVE
QUANTIFERON TB NIL VALUE: 0.04 [IU]/mL

## 2022-03-19 LAB — TB AG2: TB AG2 VALUE: 0.04

## 2022-03-19 LAB — TB NIL: TB NIL VALUE: 0.04

## 2022-03-19 LAB — TB AG1: TB AG1 VALUE: 0.04

## 2022-06-05 IMAGING — CT CT ABD-PELV W/O CM
2 of 4 series · 15 of 46 positions shown, 17 images · non-contrast
Comparison: None.

CLINICAL DATA: Left lower quadrant pain for several days, initial
encounter

EXAM:
CT ABDOMEN AND PELVIS WITHOUT CONTRAST
TECHNIQUE: Multidetector CT imaging of the abdomen and pelvis was performed
following the standard protocol without IV contrast.

[Series 2: axial st · axial · 0.87mm/px · z∈[+897,+1302]mm · 12 of 93 slices shown, 14 images]
[im 6/93  soft-tissue]
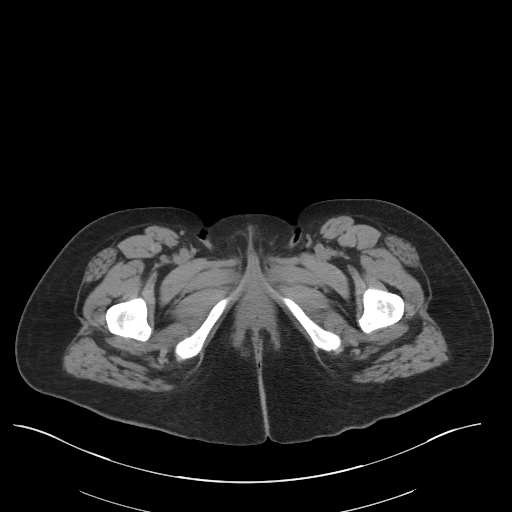
[im 6/93  bone]
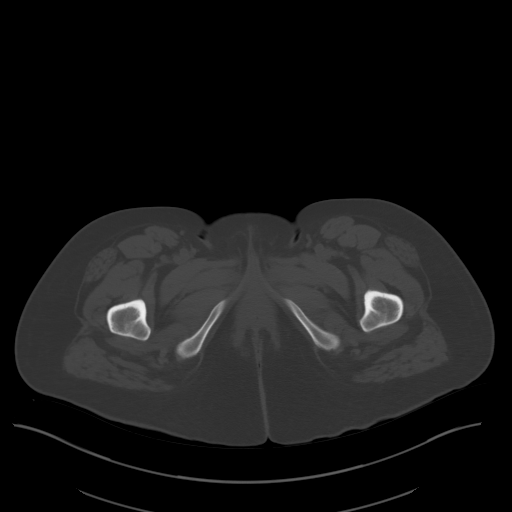
[im 17/93  soft-tissue]
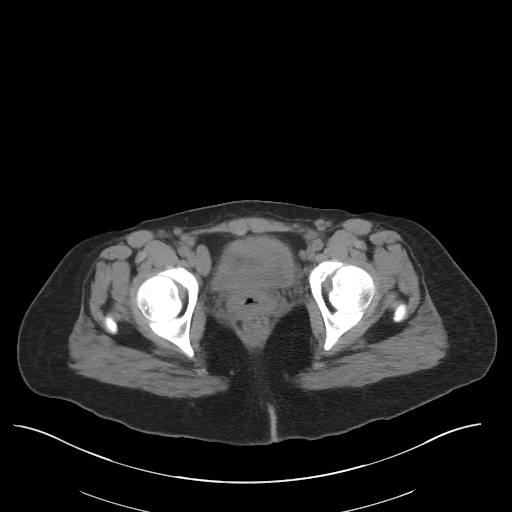
[im 22/93  soft-tissue]
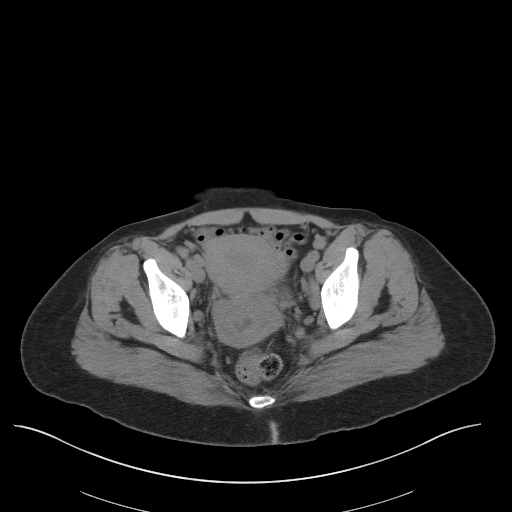
[im 28/93  soft-tissue]
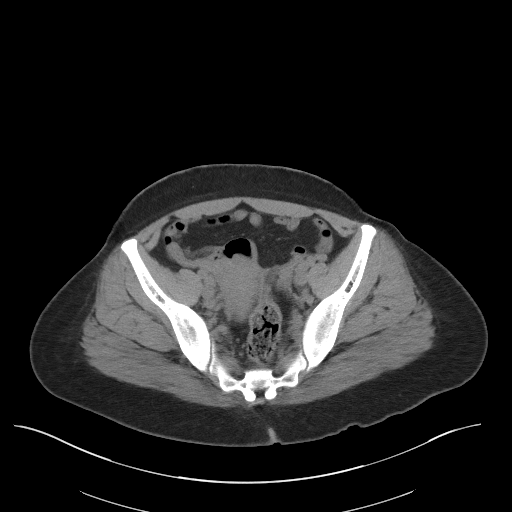
[im 38/93  soft-tissue]
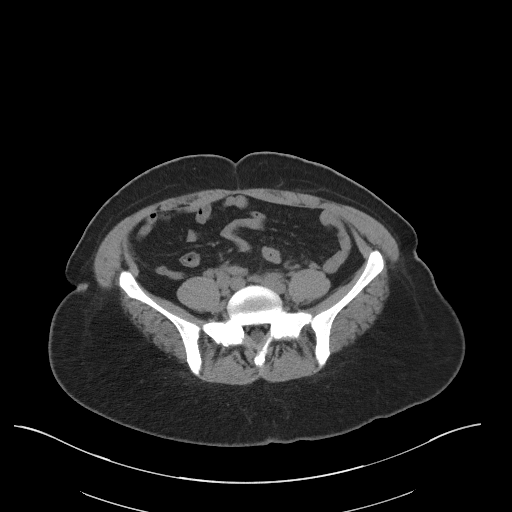
[im 44/93  soft-tissue]
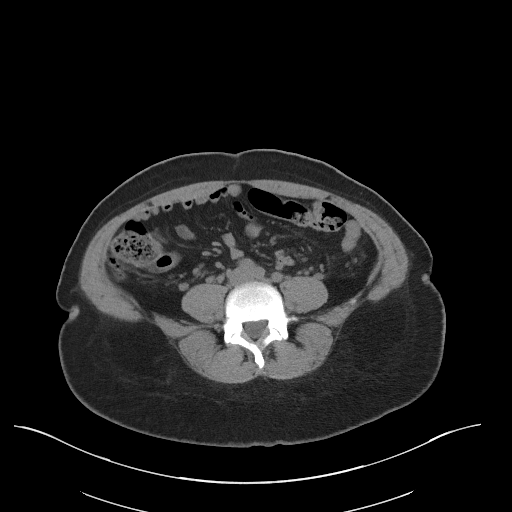
[im 49/93  soft-tissue]
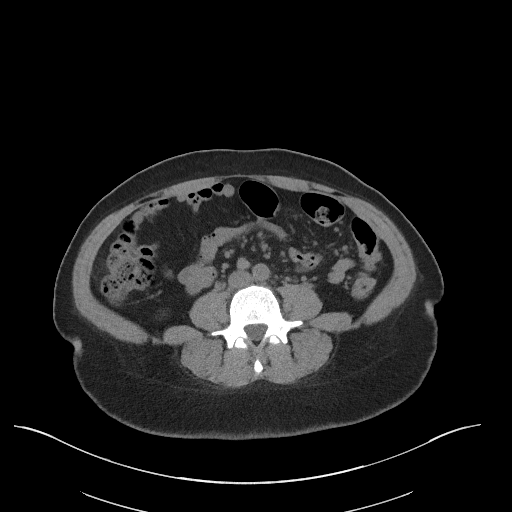
[im 60/93  soft-tissue]
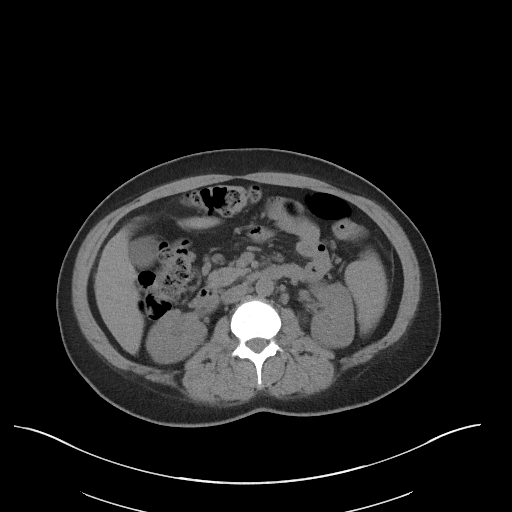
[im 65/93  soft-tissue]
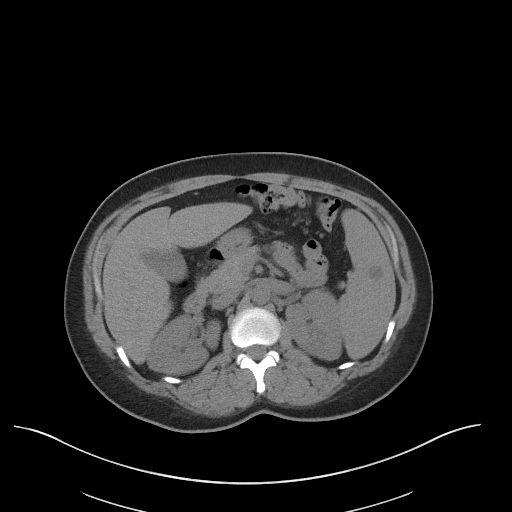
[im 65/93  bone]
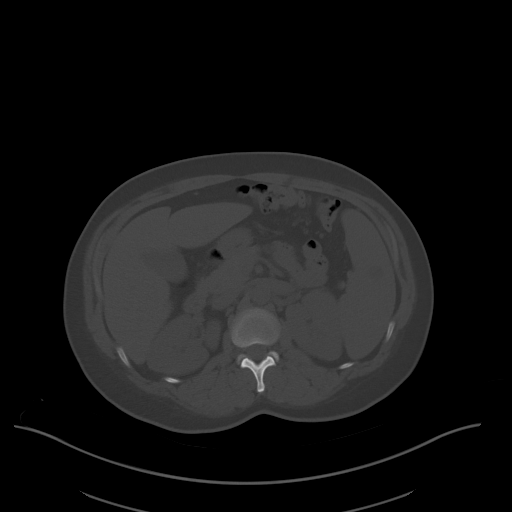
[im 71/93  soft-tissue]
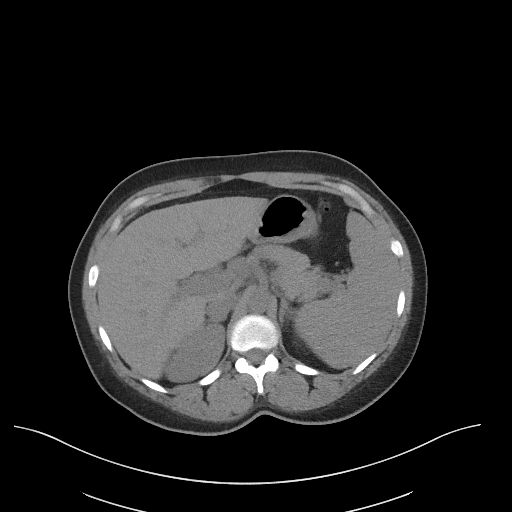
[im 82/93  soft-tissue]
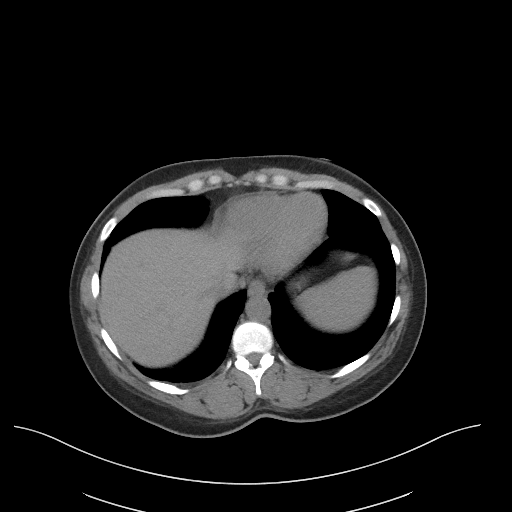
[im 87/93  soft-tissue]
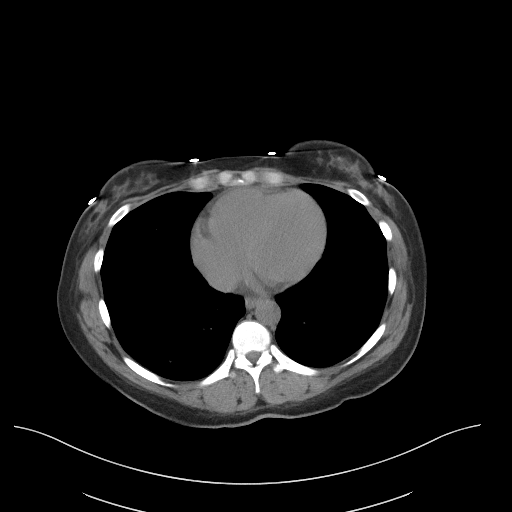

[Series 4: coronal st · coronal · 0.68mm/px · 3 of 90 slices shown]
[im 30/90  soft-tissue]
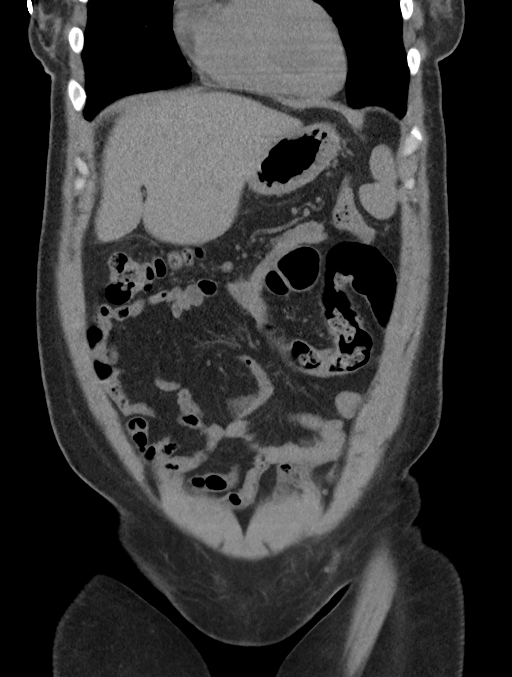
[im 40/90  soft-tissue]
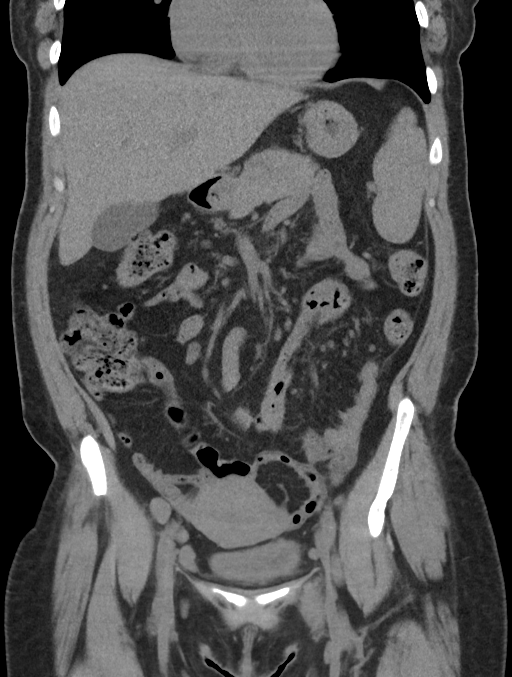
[im 50/90  soft-tissue]
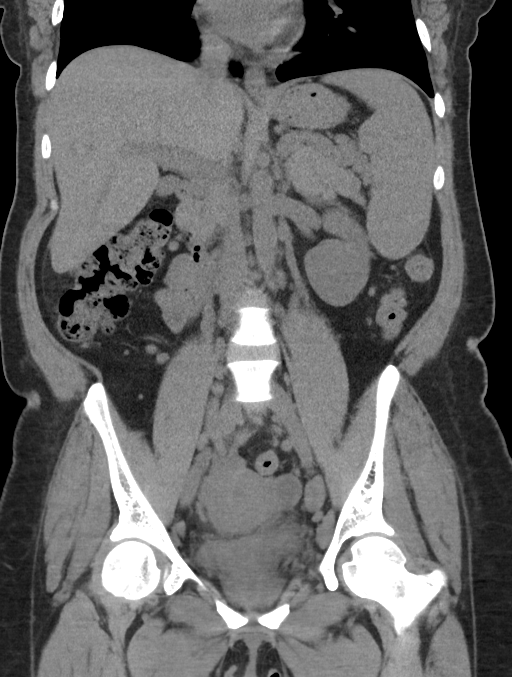

[15 of 46 positions shown; findings below may reference images not displayed]

FINDINGS: Lower chest: Lung bases demonstrate some patchy ground-glass
airspace opacity in the right lower lobe likely representing some
very early infiltrate. No effusion is noted.

Hepatobiliary: No focal liver abnormality is seen. No gallstones,
gallbladder wall thickening, or biliary dilatation.

Pancreas: Unremarkable. No pancreatic ductal dilatation or
surrounding inflammatory changes.

Spleen: Spleen is normal in size. There is a focal 12 mm hypodensity
identified in the midportion of the spleen likely representing a
cyst but incompletely evaluated on this exam.

Adrenals/Urinary Tract: Adrenal glands are within normal limits.
Kidneys demonstrate no definitive renal calculi or urinary tract
obstructive changes. The bladder is decompressed but demonstrates
some very mild surrounding inflammatory change. This raises
suspicion for underlying UTI.

Stomach/Bowel: No significant diverticular changes noted. No
inflammatory changes are identified. The appendix is well visualized
and within normal limits. The stomach and small bowel appear within
normal limits.

Vascular/Lymphatic: Vascular structures appear within normal limits.
There are scattered retroperitoneal and iliac lymph nodes
identified. The largest of these lie adjacent to the common iliac
vein on the right measuring 11.5 mm in short axis and adjacent to
the external iliac vein on the left measuring 14 mm in short axis.
These are likely reactive in nature given the changes in the
bladder.

Reproductive: Uterus is within normal limits. No definitive adnexal
mass is seen. Nabothian cysts are noted in the cervix.

Other: No abdominal wall hernia or abnormality. No abdominopelvic
ascites.

Musculoskeletal: No acute or significant osseous findings.
IMPRESSION: No evidence of diverticulitis.

Bladder is decompressed but demonstrates some mild inflammatory
changes surrounding the bladder suspicious for UTI. Some associated
likely reactive lymph nodes are noted in the pelvis bilaterally.

Hypodensity within the spleen likely representing a cyst but
incompletely evaluated on this noncontrast exam.

## 2022-06-06 IMAGING — US US PELVIS COMPLETE
1 series · 15 of 25 positions shown · non-contrast
Comparison: 04/25/2012

CLINICAL DATA: Left-sided pelvic pain, initial encounter

EXAM:
TRANSABDOMINAL ULTRASOUND OF PELVIS
DOPPLER ULTRASOUND OF OVARIES
TECHNIQUE: Transabdominal ultrasound examination of the pelvis was performed
including evaluation of the uterus, ovaries, adnexal regions, and
pelvic cul-de-sac.
Color and duplex Doppler ultrasound was utilized to evaluate blood
flow to the ovaries.
Transvaginal imaging was deferred by the patient.

[Series 1: us pelvis complete mc & wl · 15 of 65 slices shown]
[im 1/65]
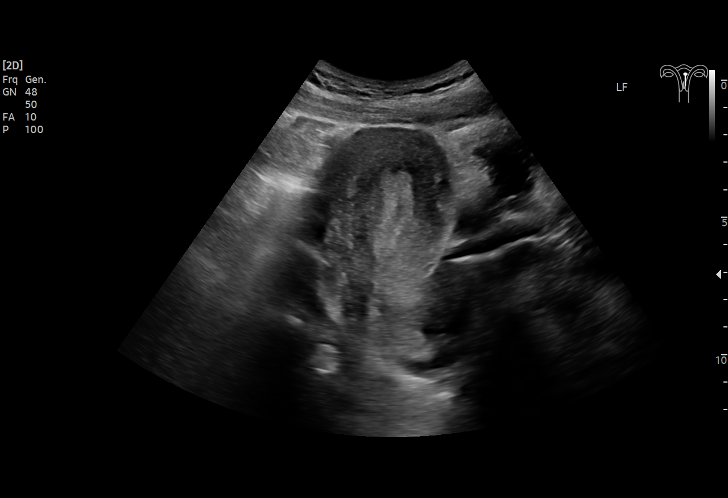
[im 6/65]
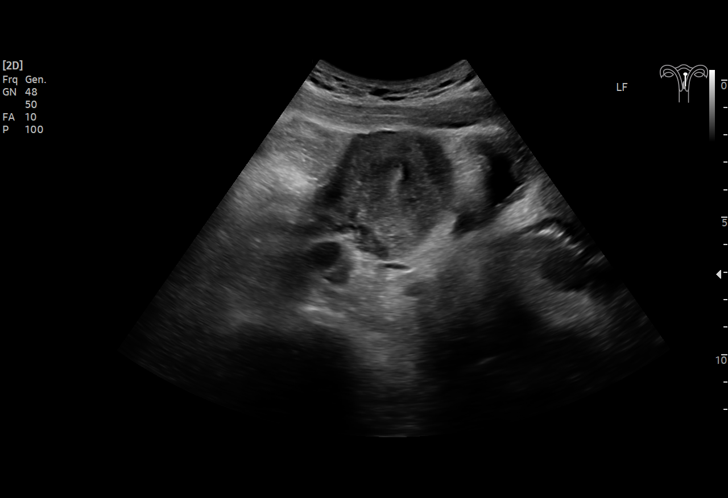
[im 11/65]
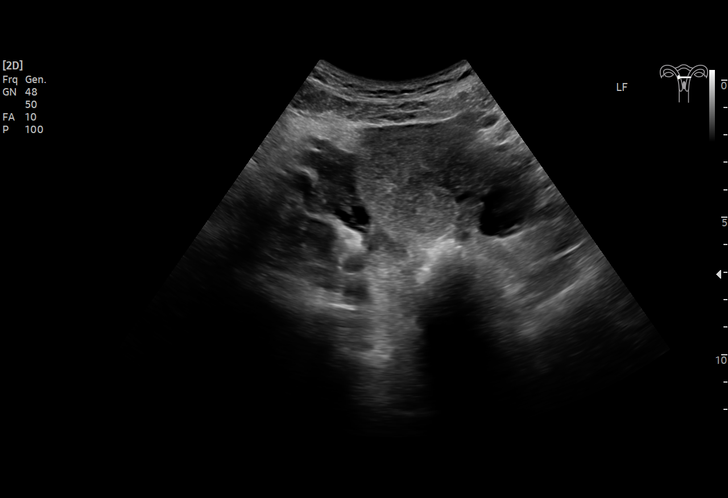
[im 14/65]
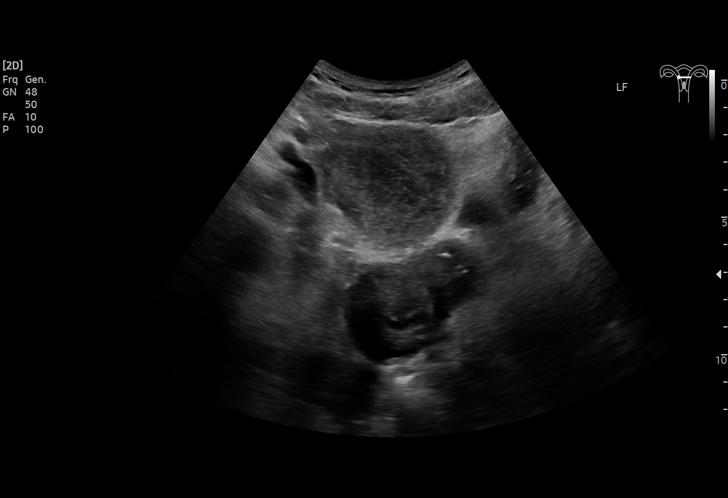
[im 19/65]
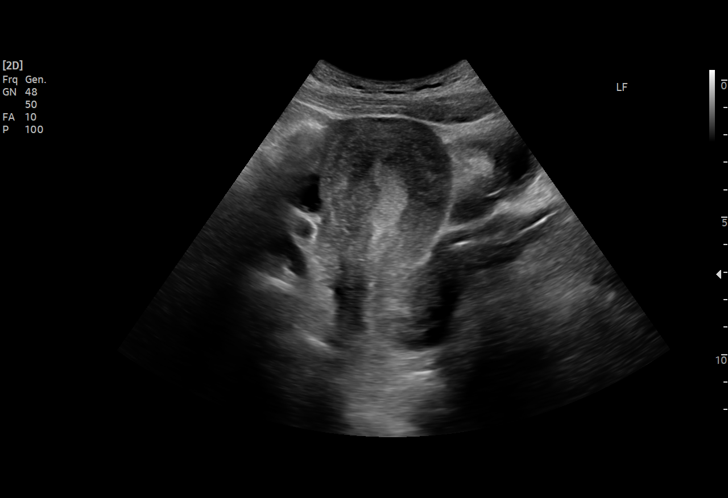
[im 25/65]
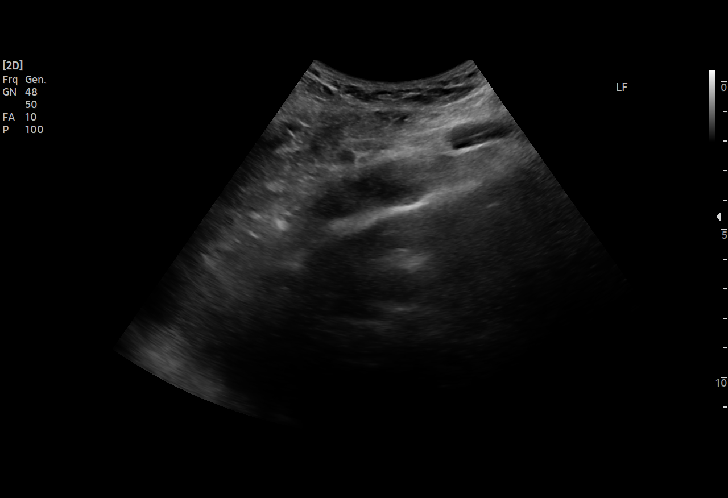
[im 27/65]
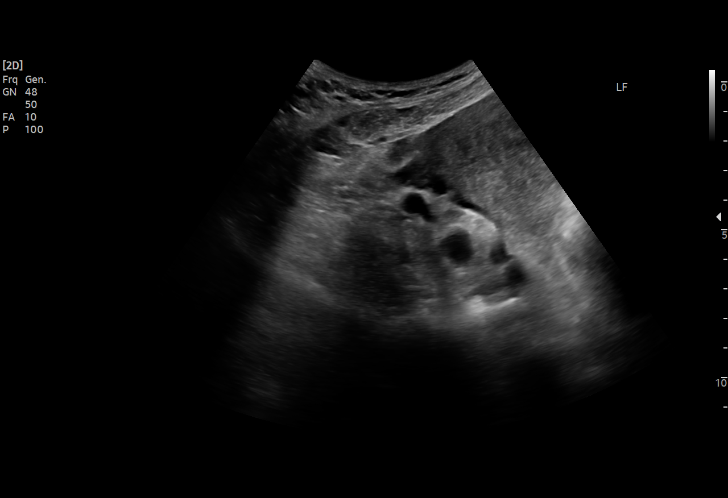
[im 33/65]
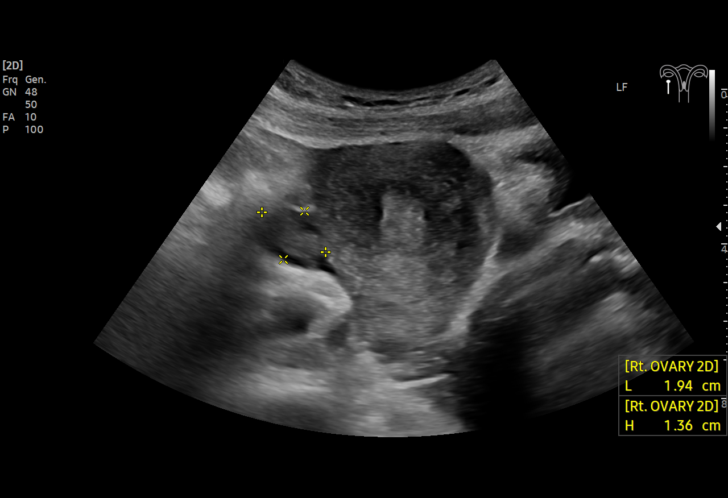
[im 38/65]
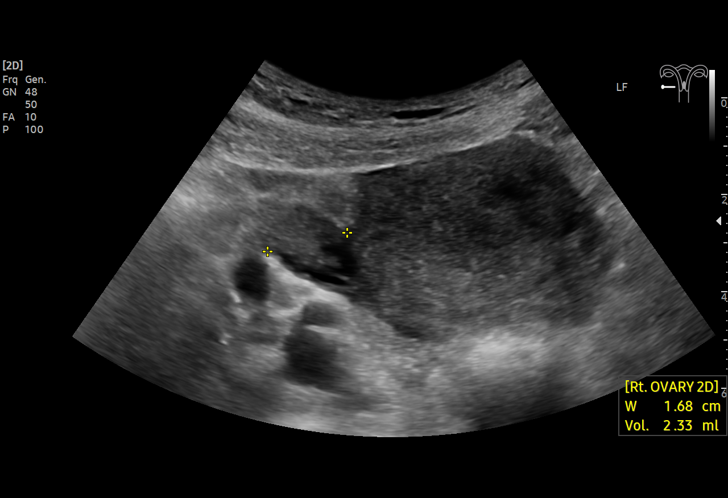
[im 41/65]
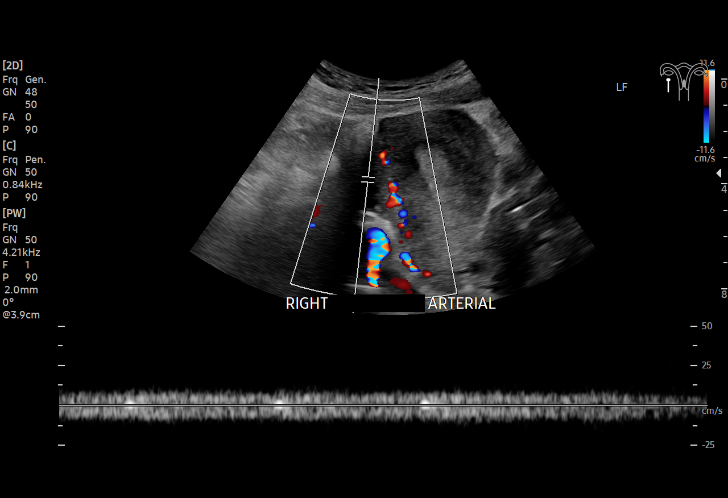
[im 46/65]
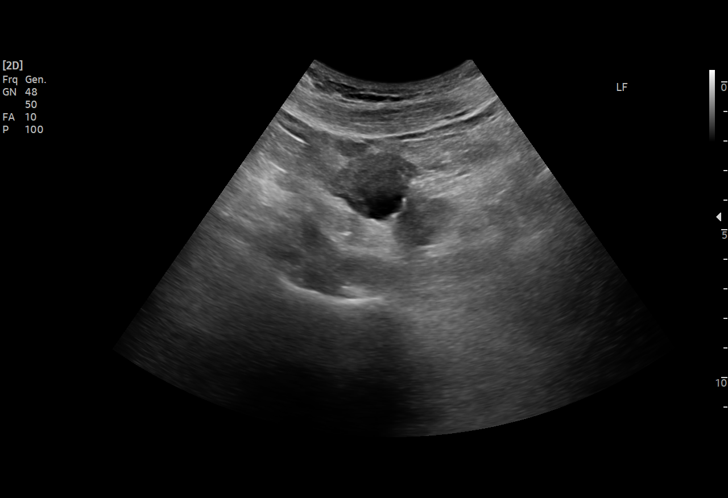
[im 51/65]
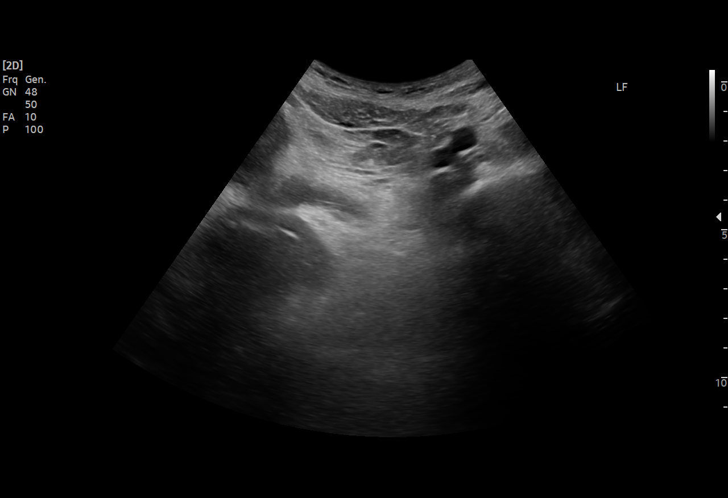
[im 54/65]
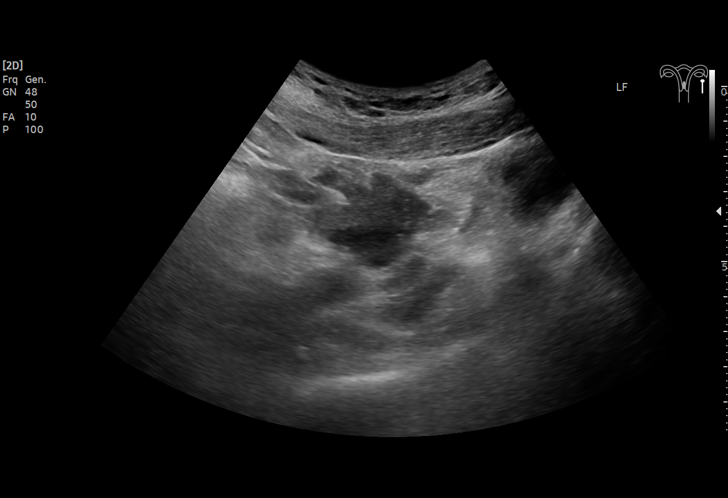
[im 59/65]
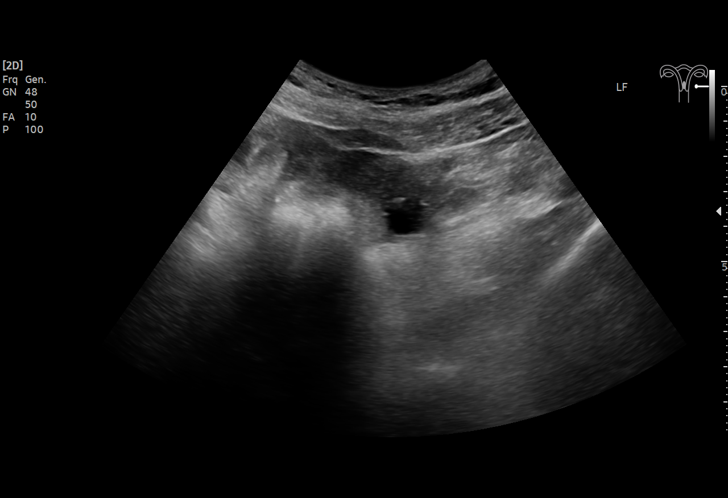
[im 65/65]
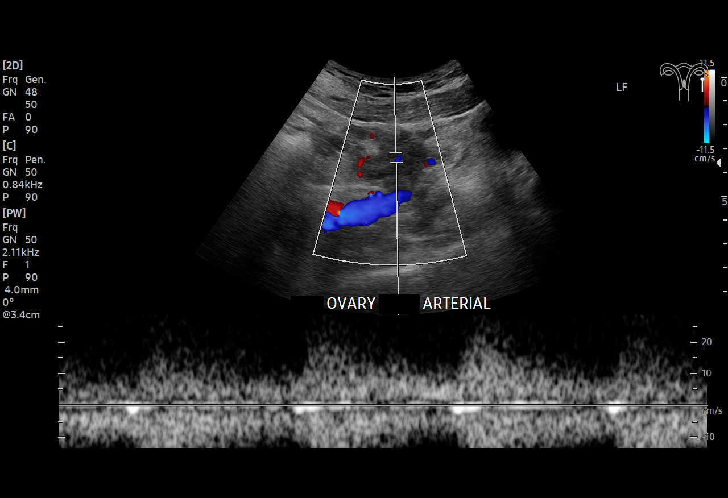

[15 of 25 positions shown; findings below may reference images not displayed]

FINDINGS: Uterus

Measurements: 9.2 x 5.2 x 5.9 cm. = volume: 146 mL. No fibroids or
other mass visualized.

Endometrium

Thickness: 10 mm.  No focal abnormality visualized.

Right ovary

Measurements: 1.9 x 1.4 x 1.7 cm. = volume: 2.3 mL. Follicular
changes are noted.

Left ovary

Measurements: 2.6 x 2.3 x 2.5 cm. = volume: 7.5 mL. Follicular
changes are noted.

Pulsed Doppler evaluation demonstrates normal low-resistance
arterial and venous waveforms in both ovaries.

Other: None
IMPRESSION: No evidence of ovarian torsion.

No acute abnormality noted.

## 2022-06-23 NOTE — Unmapped (Signed)
Sent patient chart to Benefits case manager, Jeneen Rinks.. for completing patient RW renewal.     Patient prefers not to speak with any other Benefits coordinator besides Lissa Hoard.        Madinah Cathcart-Rowe  Benefits Counselor  Time of Intervention: 2 mins

## 2022-06-25 DIAGNOSIS — N3010 Interstitial cystitis (chronic) without hematuria: Secondary | ICD-10-CM | POA: Diagnosis not present

## 2022-06-25 DIAGNOSIS — R102 Pelvic and perineal pain: Secondary | ICD-10-CM | POA: Diagnosis not present

## 2022-06-25 DIAGNOSIS — Z79899 Other long term (current) drug therapy: Secondary | ICD-10-CM | POA: Diagnosis not present

## 2022-06-26 NOTE — Unmapped (Signed)
Attempted to contact patient for RW renewal, but no answer. Left voice message.      Mickle Asper,  Benefits & Eligibility Coordinator  Time of Intervention: 2 minutes

## 2022-07-10 NOTE — Unmapped (Signed)
[facility] Clinical Assessment & Refill Coordination Note    Brandy Evans, DOB: May 01, 1990  Phone: [phone] (home)     All above HIPAA information was verified with patient.     Was a Nurse, learning disability used for this call? No    Specialty Medication(s):   Infectious Disease: Biktarvy     Current Outpatient Medications   Medication Sig Dispense Refill    bictegrav-emtricit-tenofov ala (BIKTARVY) 50-200-25 mg tablet Take 1 tablet by mouth daily. 90 tablet 3    BUTRANS 10 mcg/hour PTWK transdermal patch PLACE 1 PATCH ONTO THE SKIN EVERY 7 DAYS.      calcium carbonate (TUMS ORAL) Take by mouth.      gabapentin (NEURONTIN) 800 MG tablet Take 1 tablet (800 mg total) by mouth Three (3) times a day.      HYDROcodone-acetaminophen (NORCO) 5-325 mg per tablet Take 1 tablet by mouth daily as needed for pain.      hydrocortisone 2.5 % ointment APPLY 1 APPLICATION TWICE A DAY      hydrOXYzine (ATARAX) 25 MG tablet Take 1 tablet (25 mg total) by mouth Three (3) times a day.      nystatin (MYCOSTATIN) 100,000 unit/mL suspension 5 mL p.o. 4 times daily swish and spit or swallow      oxybutynin (DITROPAN) 5 MG tablet Take 1 tablet (5 mg total) by mouth Three (3) times a day as needed.      pentosan polysulfate (ELMIRON) 100 mg capsule Take 1 capsule (100 mg total) by mouth Three (3) times a day before meals.      tiZANidine (ZANAFLEX) 4 MG tablet Take 1 tablet (4 mg total) by mouth every twelve (12) hours as needed.       No current facility-administered medications for this visit.        Changes to medications: Lakisa reports no changes at this time.    No Known Allergies    Changes to allergies: No    SPECIALTY MEDICATION ADHERENCE     Biktarvy 50-200-25 mg: 29 days of medicine on hand after today      Medication Adherence    Patient reported X missed doses in the last month: 0  Specialty Medication: Biktarvy 50-200-25mg : Patient reports having extra tablets that accumulated leading to gaps in refills.  Patient is on additional specialty medications: No  Any gaps in refill history greater than 2 weeks in the last 3 months: yes  Demonstrates understanding of importance of adherence: yes  Informant: patient  Provider-estimated medication adherence level: good  Patient is at risk for Non-Adherence: No  Confirmed plan for next specialty medication refill: delivery by pharmacy  Refills needed for supportive medications: not needed          Specialty medication(s) dose(s) confirmed: Regimen is correct and unchanged.     Are there any concerns with adherence? No    Adherence counseling provided? Not needed    CLINICAL MANAGEMENT AND INTERVENTION      Clinical Benefit Assessment:    Do you feel the medicine is effective or helping your condition? Yes    HIV ASSOCIATED LABS:     Lab Results   Component Value Date/Time    HIVRS Not Detected 03/16/2022 05:19 PM    HIVRS Not Detected 08/27/2021 04:59 PM    HIVRS Detected (A) 06/11/2021 12:32 PM    HIVCP <20 (H) 06/11/2021 12:32 PM    HIVCP 342,345 (H) 04/23/2021 02:50 PM    ACD4 285 (L)  03/16/2022 05:19 PM    ACD4 180 (L) 08/27/2021 04:59 PM    ACD4 243 (L) 06/11/2021 12:32 PM       Clinical Benefit counseling provided? Labs from 03/16/22 show evidence of clinical benefit    Adverse Effects Assessment:    Are you experiencing any side effects? No    Are you experiencing difficulty administering your medicine? No    Quality of Life Assessment:      How many days over the past month did your HIV  keep you from your normal activities? For example, brushing your teeth or getting up in the morning. 0    Have you discussed this with your provider? Not needed    Acute Infection Status:    Acute infections noted within Epic:  No active infections  Patient reported infection: None    Therapy Appropriateness:    Is therapy appropriate and patient progressing towards therapeutic goals? Yes, therapy is appropriate and should be continued    DISEASE/MEDICATION-SPECIFIC INFORMATION      N/A    HIV: Not Applicable    PATIENT SPECIFIC NEEDS     Does the patient have any physical, cognitive, or cultural barriers? No    Is the patient high risk? No    Did the patient require a clinical intervention? No    Does the patient require physician intervention or other additional services (i.e., nutrition, smoking cessation, social work)? No    SOCIAL DETERMINANTS OF HEALTH     At the [facility], we have learned that life circumstances - like trouble affording food, housing, utilities, or transportation can affect the health of many of our patients.   That is why we wanted to ask: are you currently experiencing any life circumstances that are negatively impacting your health and/or quality of life? Patient declined to answer    Social Determinants of Health     Financial Resource Strain: Not on file   Internet Connectivity: Not on file   Food Insecurity: Not on file   Tobacco Use: Low Risk  (06/25/2022)    Received from [facility]    Patient History     Smoking Tobacco Use: Never     Smokeless Tobacco Use: Never     Passive Exposure: Not on file   Housing/Utilities: Not on file   Alcohol Use: Not on file   Transportation Needs: Not on file   Substance Use: Not on file   Health Literacy: Not on file   Physical Activity: Not on file   Interpersonal Safety: Not on file   Stress: Not on file   Intimate Partner Violence: Not on file   Depression: Not at risk (12/25/2021)    Received from [facility]    PHQ-2     Patient Health Questionnaire-2 Score: 0   Social Connections: Not on file       Would you be willing to receive help with any of the needs that you have identified today? Not applicable       SHIPPING     Specialty Medication(s) to be Shipped:   Infectious Disease: Biktarvy    Other medication(s) to be shipped: No additional medications requested for fill at this time     Changes to insurance: No    Delivery Scheduled: Yes, Expected medication delivery date: 07/27/22. Medication will be delivered via UPS to the confirmed prescription address in [facility].    The patient will receive a drug information handout for each medication shipped and additional  FDA Medication Guides as required.  Verified that patient has previously received a Conservation officer, historic buildings and a Surveyor, mining.    The patient or caregiver noted above participated in the development of this care plan and knows that they can request review of or adjustments to the care plan at any time.      All of the patient's questions and concerns have been addressed.    Roderic Palau, PharmD   [facility] Specialty Pharmacist

## 2022-07-24 MED FILL — BIKTARVY 50 MG-200 MG-25 MG TABLET: ORAL | 90 days supply | Qty: 90 | Fill #0

## 2022-08-11 NOTE — Unmapped (Unsigned)
[facility]  [address]  [place], [place]  [address]  P [phone]  F [phone]     Primary care provider: Karie Chimera, MD     I personally spent 45 minutes face-to-face and non-face-to-face in the care of this patient, which includes all pre, intra, and post visit time on the date of service.  All documented time was specific to the E/M visit and does not include any procedures that may have been performed.    Assessment/Plan:      Brandy Evans [facility] Christell Constant) is a 32 y.o. female seen today for HIV followup after engaging in care at [facility] 03/2021.    HIV - advanced, uncontrolled (dx'd 2013, nadir CD4 26 (2%) 03/2021)  - chronic, severe   Candida esophagitis prior to therapy restart  Current regimen: Biktarvy (BIC/FTC/TAF)  Misses doses of ARVs never  Med access via Medicaid  CD4 count over 100 and VL suppressed on ART >9m; primary prophylaxis against PCP and toxo no longer needed (stopped after last visit 07/2021)  Discussed ARV adherence  Lab Results   Component Value Date    ACD4 285 (L) 03/16/2022    CD4 19 (L) 03/16/2022    HIVRS Not Detected 03/16/2022    HIVCP <20 (H) 06/11/2021     CD4, HIV RNA, and safety labs (full return panel)  Continue current therapy refill provided  Encouraged continued excellent      Anemia   Lab Results   Component Value Date    HGB 10.0 (L) 03/16/2022     Lab Results   Component Value Date    HCT 31.5 (L) 03/16/2022     - She has a history of sickle cell trait and chronic anemia  - This is also concerning in light of uncontrolled HIV (MAI) - will monitor    History of genital HSV-1 infection, 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 Discussed secondary prophylaxis, prefers to continue episodic treatment. None   Prescription for valacyclovir 1g BID for 7-10d during outbreaks    Pelvic pain  Interstitial cystitis  Difficulty walking  - Follows with [facility] pain med (seen 5/26, on gabapentin and buprenorphine), Urology (seen 5/22, on bladder instillation w/ sensorcaine and Elmiron), and pelvic floor physical therapy (seen 5/25)  improved    Hypertension  - Reports this is chronic, asymptomatic      Sexual health & secondary prevention  We have discussed U=U.     Lab Results   Component Value Date    RPR Reactive (A) 03/16/2022    RPR 1:4 03/16/2022    CTNAA Negative 04/23/2021    GCNAA Negative 04/23/2021    SPECSOURCE Urine 04/23/2021     GC/CT NAATs -  Obtained at recent visit  RPR - repeat 6 months after prior (has been serofast at 1:4, initial dx 2013)    Health maintenance   Oral health  She does  have a dentist. Last dental exam within the past year.    Eye health  She does  use corrective lenses. Last eye exam within the past year.    Metabolic conditions  Wt Readings from Last 5 Encounters:   03/16/22 81.3 kg (179 lb 4.8 oz)   08/27/21 77.6 kg (171 lb)   06/11/21 (P) 64.9 kg (143 lb)     Lab Results   Component Value Date    CREATININE 0.78 03/16/2022    PROTEINUA 70 mg/dL (A) 13/10/6576  GLUCOSEU Trace (A) 09/24/2020    GLU 113 03/16/2022    A1C 5.7 (H) 03/16/2022    ALT <7 (L) 03/16/2022    ALT <7 (L) 06/11/2021    ALT <7 (L) 04/23/2021     # Kidney health - creatinine today  # Bone health - assessment not yet needed (under age 3)  # Diabetes assessment - A1c needed but deferred to future visit  # NAFLD assessment - suspicion for NAFLD low    Communicable diseases  Lab Results   Component Value Date    QFTTBGOLD Negative 03/16/2022    HEPAIGG Reactive (A) 03/16/2022    HEPBSAB Nonreactive 03/16/2022    HEPCAB Nonreactive 03/16/2022     # TB screening - no longer needed; negative IGRA, low risk  # Hepatitis screening - HCV Ab (will reflex to RNA & genotype if reactive)   # MMR screening - assessment(s) needed but deferred to future visit    Cancer screening  No results found for: PSASCRN, PSA, PAP, FINALDX  # Cervical - assessment(s) needed but deferred to future visit (discussed elevated risk of cancer; patient says vaginal penetration remains too painful for exam but improving w/ pelvic PT, briefly discussed asking OB here if EUA is possible, she prefers to wait and see how things go with pelvic PT, which is reasonable)  # Breast - no indication for screening at present    # Anorectal - not yet done  # Colorectal - screening not indicated  # Liver - no screening indicated  # Lung - screening not indicated    Cardiovascular disease  Lab Results   Component Value Date    CHOL 118 03/16/2022    HDL 37 (L) 03/16/2022    LDL 45 03/16/2022    NONHDL 81 03/16/2022    TRIG 161 (H) 03/16/2022     # The ASCVD Risk score (Arnett DK, et al., 2019) failed to calculate.  - is not taking aspirin   - is not taking statin  - BP control good  - never smoker  Check A1c, lipids today  # AAA screening - no indication for screening    Immunization History   Administered Date(s) Administered    COVID-19 VAC,BIVALENT(89YR UP),PFIZER 06/11/2021    COVID-19 VACC,MRNA,(PFIZER)(PF) 07/07/2019, 08/01/2019    DTP 01/05/1991, 06/02/1994    HPV Quadrivalent (Gardasil) 04/15/2015    Haemophilis Influenza Type B Vaccine Hboc 09/16/1990    Hepatitis B Vaccine, Unspecified Formulation 08/03/2001, 09/14/2001, 06/26/2002    HiB, unspecified 08/05/1989, 10/07/1989, 12/03/1989, 01/05/1991    Human Pappilomavirus Vaccine,9-Valent(PF) 03/16/2022    INFLUENZA TIV (TRI) 73MO+ W/ PRESERV (IM) 01/12/2012    Influenza Vaccine Quad(IM)6 MO-Adult(PF) 02/13/2013, 02/11/2017    Influenza Virus Vaccine, unspecified formulation 11/28/2014    MMR 09/16/1990, 06/02/1994    OPV 06/02/1994    PNEUMOCOCCAL POLYSACCHARIDE 23-VALENT 09/01/2011    Pneumococcal Conjugate 20-valent 08/27/2021    Polio Virus Vaccine, Unspecified Formulation 08/12/1989, 10/07/1989, 12/03/1989, 11/15/1990    Td (adult) unspecified formulation 08/03/2001    TdaP 04/16/2017     Immunizations today - HPV today (dose 3 of 3); VZV and meningitis booster at later visit; recommended COVID spikevax, but patient prefers to have Pfizer only, only have Moderna in clinic      Hep B vaccination dose 1 of 2 today    Disposition  Return to clinic 5-6 months or sooner if needed.        Subjective:      Chief Complaint  HIV followup    HPI  Return patient visit for [employer], a 32 y.o. female. Recently restarted ART (TAF/FTC/BIC) 03/2021.     HIV history:  Diagnosed 2013 on routine pregnancy screening, previously followed by [facility]  03/21/2015 genotype no resistance (integrase resistance not included)  01/29/2017 genotype - per provider notes E138A (R-rilpivirine)  Previous ARVs: TDF/FTC + atazanavir/ritonavir (~2014)  TAF/FTC + darunavir/cobicistat (Prescobix) (2017)  TDF/FTC + dolutegravir  TAF/FTC + dolutegravir most recently, per patient into late 2021 when former providers continued to prescribe  Last seen in [facility] 06/11/2017  Multiple months off medications at different junctures, major barrier to care has been privacy (bad experiences w/ indiscreet healthcare systems previously) and not wanting to call to make appointments. Patient was reengaged in care via [facility] in early 2023.    Interval history:    Saw pelvic pain clinic at end of September-still on gabapentin and buprenorphine as well as muscle relaxers and bladder instillation for IC with urology  Has ups and downs but overall feels significantly better than last year (walking ok)    No recent or ongoing fevers  Living in [place] with her kids, has 2 kids (4 and 36 yo sons)  Smokes hookah but no tobacco or MJ        Past Medical History:   Diagnosis Date    Chronic interstitial cystitis     Genital herpes simplex type 1 infection     History of chlamydia 01/2013    History of COVID-19 2022    History of syphilis 01/2012    - 02/09/2012 RPR 1:64 (CareEverywhere), s/p pen G x3 (11/26, 12/3, 03/15/2012)    HIV (human immunodeficiency virus infection) (CMS-HCC) 2013    Diagnosed 2013 on routine pregnancy screening, previously followed by [facility]    Sickle cell trait (CMS-HCC)      Medications and Allergies   Reviewed and updated today. See bottom of this visit's encounter summary for details.  Current Outpatient Medications on File Prior to Visit   Medication Sig    bictegrav-emtricit-tenofov ala (BIKTARVY) 50-200-25 mg tablet Take 1 tablet by mouth daily.    BUTRANS 10 mcg/hour PTWK transdermal patch PLACE 1 PATCH ONTO THE SKIN EVERY 7 DAYS.    calcium carbonate (TUMS ORAL) Take by mouth.    gabapentin (NEURONTIN) 800 MG tablet Take 1 tablet (800 mg total) by mouth Three (3) times a day.    HYDROcodone-acetaminophen (NORCO) 5-325 mg per tablet Take 1 tablet by mouth daily as needed for pain.    hydrocortisone 2.5 % ointment APPLY 1 APPLICATION TWICE A DAY    hydrOXYzine (ATARAX) 25 MG tablet Take 1 tablet (25 mg total) by mouth Three (3) times a day.    methocarbamol (ROBAXIN) 500 MG tablet Take 1 tablet (500 mg total) by mouth Three (3) times a day as needed (for muscle spasms). (Patient not taking: Reported on 07/10/2022)    nystatin (MYCOSTATIN) 100,000 unit/mL suspension 5 mL p.o. 4 times daily swish and spit or swallow    oxybutynin (DITROPAN) 5 MG tablet Take 1 tablet (5 mg total) by mouth Three (3) times a day as needed.    pentosan polysulfate (ELMIRON) 100 mg capsule Take 1 capsule (100 mg total) by mouth Three (3) times a day before meals.    tiZANidine (ZANAFLEX) 4 MG tablet Take 1 tablet (4 mg total) by mouth every twelve (12) hours as needed.     No current  facility-administered medications on file prior to visit.     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 Remainder of 10 systems reviewed, negative.        Objective:      There were no vitals taken for this visit.  Wt Readings from Last 3 Encounters:   03/16/22 81.3 kg (179 lb 4.8 oz)   08/27/21 77.6 kg (171 lb)   06/11/21 (P) 64.9 kg (143 lb)     Const looks well and attentive, alert, appropriate; face filled out   Eyes sclerae anicteric, noninjected OU   ENT no thrush, leukoplakia or oral lesions Linea alba bilaterally   Lymph no cervical or supraclavicular LAD   CV RRR. No murmurs. No rub or gallop. S1/S2. No peripheral edema   Lungs no clubbing or cyanosis and normal WOB   GI Soft, nondistended. NABS.   GU deferred   Rectal deferred   Skin no petechiae, ecchymoses or obvious rashes on clothed exam   MSK no joint tenderness   Neuro CN II-XII intact and gait not tested   Psych Appropriate affect. Eye contact good. Linear thoughts. Fluent speech.     Laboratory Data  Reviewed in Epic today, using Synopsis and Chart Review filters.    Lab Results   Component Value Date    CREATININE 0.78 03/16/2022    QFTTBGOLD Negative 03/16/2022    HEPCAB Nonreactive 03/16/2022    CHOL 118 03/16/2022    HDL 37 (L) 03/16/2022    LDL 45 03/16/2022    NONHDL 81 03/16/2022    TRIG 161 (H) 03/16/2022    A1C 5.7 (H) 03/16/2022

## 2022-10-21 DIAGNOSIS — B20 Human immunodeficiency virus [HIV] disease: Secondary | ICD-10-CM | POA: Diagnosis not present

## 2022-10-21 DIAGNOSIS — R918 Other nonspecific abnormal finding of lung field: Secondary | ICD-10-CM | POA: Diagnosis not present

## 2022-10-21 DIAGNOSIS — R0789 Other chest pain: Secondary | ICD-10-CM | POA: Diagnosis not present

## 2022-10-21 DIAGNOSIS — R000 Tachycardia, unspecified: Secondary | ICD-10-CM | POA: Diagnosis not present

## 2022-10-21 DIAGNOSIS — M5459 Other low back pain: Secondary | ICD-10-CM | POA: Diagnosis not present

## 2022-10-21 DIAGNOSIS — J9811 Atelectasis: Secondary | ICD-10-CM | POA: Diagnosis not present

## 2022-10-21 DIAGNOSIS — I10 Essential (primary) hypertension: Secondary | ICD-10-CM | POA: Diagnosis not present

## 2022-10-21 DIAGNOSIS — J189 Pneumonia, unspecified organism: Secondary | ICD-10-CM | POA: Diagnosis not present

## 2022-10-21 DIAGNOSIS — R0602 Shortness of breath: Secondary | ICD-10-CM | POA: Diagnosis not present

## 2022-10-21 DIAGNOSIS — K429 Umbilical hernia without obstruction or gangrene: Secondary | ICD-10-CM | POA: Diagnosis not present

## 2022-10-21 DIAGNOSIS — R160 Hepatomegaly, not elsewhere classified: Secondary | ICD-10-CM | POA: Diagnosis not present

## 2022-10-22 ENCOUNTER — Encounter (HOSPITAL_BASED_OUTPATIENT_CLINIC_OR_DEPARTMENT_OTHER): Payer: Self-pay | Admitting: Emergency Medicine

## 2022-10-22 ENCOUNTER — Emergency Department (HOSPITAL_BASED_OUTPATIENT_CLINIC_OR_DEPARTMENT_OTHER): Payer: Medicaid Other

## 2022-10-22 ENCOUNTER — Other Ambulatory Visit: Payer: Self-pay

## 2022-10-22 ENCOUNTER — Emergency Department (HOSPITAL_BASED_OUTPATIENT_CLINIC_OR_DEPARTMENT_OTHER)
Admission: EM | Admit: 2022-10-22 | Discharge: 2022-10-23 | Disposition: A | Discharge status: Home / Self Care | Payer: Medicaid Other | Attending: Emergency Medicine | Admitting: Emergency Medicine

## 2022-10-22 DIAGNOSIS — R0602 Shortness of breath: Secondary | ICD-10-CM | POA: Diagnosis not present

## 2022-10-22 DIAGNOSIS — R059 Cough, unspecified: Secondary | ICD-10-CM | POA: Diagnosis present

## 2022-10-22 DIAGNOSIS — R079 Chest pain, unspecified: Secondary | ICD-10-CM | POA: Diagnosis not present

## 2022-10-22 DIAGNOSIS — R0789 Other chest pain: Secondary | ICD-10-CM | POA: Diagnosis not present

## 2022-10-22 DIAGNOSIS — U071 COVID-19: Secondary | ICD-10-CM | POA: Insufficient documentation

## 2022-10-22 DIAGNOSIS — R918 Other nonspecific abnormal finding of lung field: Secondary | ICD-10-CM | POA: Diagnosis not present

## 2022-10-22 DIAGNOSIS — R000 Tachycardia, unspecified: Secondary | ICD-10-CM | POA: Diagnosis not present

## 2022-10-22 DIAGNOSIS — D649 Anemia, unspecified: Secondary | ICD-10-CM | POA: Insufficient documentation

## 2022-10-22 DIAGNOSIS — R071 Chest pain on breathing: Secondary | ICD-10-CM | POA: Diagnosis not present

## 2022-10-22 DIAGNOSIS — R509 Fever, unspecified: Secondary | ICD-10-CM | POA: Diagnosis not present

## 2022-10-22 DIAGNOSIS — I10 Essential (primary) hypertension: Secondary | ICD-10-CM | POA: Diagnosis not present

## 2022-10-22 NOTE — ED Triage Notes (Addendum)
Pt presents to ED [facility] from [facility]. Pt c/o CP that is lower chest. Diagnosed w/ pneumonia yesterday in [place]. given antibiotics but has not picked them up yet. SOB w/ exertion.  Tylenol ~45 mins ago. Fever at [facility] 102  Per EMS VS pt tachy - 124, 160/100, CBG - 161, 98% RA

## 2022-10-23 ENCOUNTER — Encounter (HOSPITAL_BASED_OUTPATIENT_CLINIC_OR_DEPARTMENT_OTHER): Payer: Self-pay | Admitting: Emergency Medicine

## 2022-10-23 MED ORDER — AZITHROMYCIN 250 MG PO TABS: 250.0000 mg | ORAL_TABLET | Freq: Every day | ORAL | 0 refills | Status: DC

## 2022-10-23 MED ORDER — SODIUM CHLORIDE 0.9 % IV BOLUS
1000.0000 mL | Freq: Once | INTRAVENOUS | Status: AC
  Administered 2022-10-23: 1000 mL via INTRAVENOUS

## 2022-10-23 MED ORDER — AZITHROMYCIN 250 MG PO TABS
500.0000 mg | ORAL_TABLET | Freq: Once | ORAL | Status: AC
  Administered 2022-10-23: 500 mg via ORAL
  Filled 2022-10-23: qty 2

## 2022-10-23 MED ORDER — SODIUM CHLORIDE 0.9 % IV SOLN
1.0000 g | Freq: Once | INTRAVENOUS | Status: AC
  Administered 2022-10-23: 1 g via INTRAVENOUS
  Filled 2022-10-23: qty 10

## 2022-10-23 NOTE — ED Notes (Signed)
 RN reviewed discharge instructions with pt. Pt verbalized understanding and had no further questions. VSS upon discharge.  

## 2022-10-23 NOTE — ED Notes (Signed)
Pt advises being unable to provide urine sample at this time, will monitor following bolus completion.

## 2022-10-23 NOTE — ED Notes (Signed)
Pt ambulated to and from bathroom without assistance 

## 2022-10-23 NOTE — ED Provider Notes (Signed)
EMERGENCY DEPARTMENT AT [facility] Provider Note   CSN: [id number] Arrival date & time: 10/22/22  2218     History  Chief Complaint  Patient presents with   Chest Pain    Tonya Weeks is a 32 y.o. female.  The history is provided by the patient.  Patient presents with continued cough, sore throat, fevers, chest pain and bodyaches.  This has  been ongoing for over 2 days.  No vomiting or diarrhea.  No hemoptysis.  She reports shortness of breath with exertion.  She reports pleuritic type chest pain that is sharp.  She was just recently in [facility].  The symptoms started in [place], she went to the hospital and had an extensive workup and was told she had pneumonia.  She was supposed to start azithromycin but she was unable to obtain the medications.   After returning home to [facility] her symptoms have continued.  She went to a local urgent care and was directed to the ER.  She has been given meds for fever.  Patient has a history of interstitial cystitis as well as HIV that is well-controlled medications and is undetectable  Home Medications Prior to Admission medications   Medication Sig Start Date End Date Taking? Authorizing Provider  azithromycin (ZITHROMAX) 250 MG tablet Take 1 tablet (250 mg total) by mouth daily. 10/23/22  Yes Zadie Rhine, MD  DESCOVY 200-25 MG tablet Take 1 tablet by mouth daily. 06/14/17   [provider]  ELMIRON 100 MG capsule Take 100 mg by mouth 3 (three) times daily. 12/17/20   [provider]  Emtricitabine-Tenofovir DF (TRUVADA PO) Take 1 tablet by mouth daily.    [provider]  gabapentin (NEURONTIN) 300 MG capsule Take 600 mg by mouth 3 (three) times daily. 12/19/20   [provider]  hydrocortisone (ANUSOL-HC) 25 MG suppository Place 1 suppository (25 mg total) rectally 2 (two) times daily. 01/15/21   Caccavale, Sophia, PA-C  hydrOXYzine (ATARAX/VISTARIL) 25 MG  tablet Take 1 tablet (25 mg total) by mouth nightly. 1-2 one hour before sleep 12/05/20   [provider]  lidocaine (LMX) 4 % cream Apply 1 application topically as needed. Patient taking differently: Apply 1 application topically daily as needed (for bodyaches). 09/17/20   Joy, Shawn C, PA-C  naproxen sodium (ALEVE) 220 MG tablet Take 440 mg by mouth daily as needed (pain).    [provider]  nystatin (MYCOSTATIN) 100000 UNIT/ML suspension Take 5 mLs (500,000 Units total) by mouth 4 (four) times daily. 01/15/21   Caccavale, Sophia, PA-C  PRESCRIPTION MEDICATION Diazepam vaginal suppository: Insert suppository into the vagina at bedtime as needed for severe pain    [provider]      Allergies    Patient has no known allergies.    Review of Systems   Review of Systems  Constitutional:  Positive for fatigue and fever.  HENT:  Positive for sore throat.   Respiratory:  Positive for cough and shortness of breath.   Cardiovascular:  Positive for chest pain.  Gastrointestinal:  Negative for vomiting.  Genitourinary:  Negative for dysuria.    Physical Exam Updated Vital Signs BP 124/68   Pulse 98   Temp 98.5 F (36.9 C) (Oral)   Resp (!) 26   SpO2 96%  Physical Exam CONSTITUTIONAL: Well developed/well nourished HEAD: Normocephalic/atraumatic EYES: EOMI/PERRL ENMT: Mucous membranes moist, no stridor, no drooling NECK: supple no meningeal signs SPINE/BACK:entire spine nontender CV: S1/S2 noted, no  murmurs/rubs/gallops noted LUNGS: Mild tachypnea, coarse breath sounds bilaterally ABDOMEN: soft, nontender NEURO: Pt is awake/alert/appropriate, moves all extremitiesx4.  No facial droop.   EXTREMITIES: pulses normal/equal, full ROM SKIN: warm, color normal PSYCH: no abnormalities of mood noted, alert and oriented to situation  ED Results / Procedures / Treatments   Labs (all labs ordered are listed, but only abnormal results are displayed) Labs Reviewed   SARS CORONAVIRUS 2 BY RT PCR - Abnormal; Notable for the following components:      Result Value   SARS Coronavirus 2 by RT PCR POSITIVE (*)    All other components within normal limits  COMPREHENSIVE METABOLIC PANEL - Abnormal; Notable for the following components:   Sodium 134 (*)    Potassium 3.3 (*)    Glucose, Bld 124 (*)    All other components within normal limits  CBC WITH DIFFERENTIAL/PLATELET - Abnormal; Notable for the following components:   Hemoglobin 9.9 (*)    HCT 30.2 (*)    MCV 74.8 (*)    MCH 24.5 (*)    RDW 16.2 (*)    All other components within normal limits  URINALYSIS, W/ REFLEX TO CULTURE (INFECTION SUSPECTED) - Abnormal; Notable for the following components:   Protein, ur 30 (*)    Bacteria, UA RARE (*)    All other components within normal limits  GROUP A STREP BY PCR  CULTURE, BLOOD (ROUTINE X 2)  CULTURE, BLOOD (ROUTINE X 2)  LACTIC ACID, PLASMA  PROTIME-INR  APTT  PREGNANCY, URINE    EKG ED ECG REPORT   Date: 10/22/2022 2338  Rate: 109  Rhythm: sinus tachycardia  QRS Axis: normal  Intervals: normal  ST/T Wave abnormalities: nonspecific T wave changes  Conduction Disutrbances:none   I have personally reviewed the EKG tracing and agree with the computerized printout as noted.   Radiology DG Chest Port 1 View  Result Date: 10/22/2022 CLINICAL DATA:  Shortness of breath EXAM: PORTABLE CHEST 1 VIEW COMPARISON:  Chest x-ray 06/27/2013 FINDINGS: There some minimal strandy opacities in the right infrahilar region. The lungs are otherwise clear. There is no pleural effusion or pneumothorax. Cardiomediastinal silhouette is within normal limits. No acute fractures. IMPRESSION: Minimal strandy opacities in the right infrahilar region, favored to represent atelectasis. Electronically Signed   By: Darliss Cheney M.D.   On: 10/22/2022 23:19    Procedures Procedures    Medications Ordered in ED Medications  cefTRIAXone (ROCEPHIN) 1 g in sodium  chloride 0.9 % 100 mL IVPB (0 g Intravenous Stopped 10/23/22 0134)  azithromycin (ZITHROMAX) tablet 500 mg (500 mg Oral Given 10/23/22 0043)  sodium chloride 0.9 % bolus 1,000 mL (0 mLs Intravenous Stopped 10/23/22 0142)    ED Course/ Medical Decision Making/ A&P Clinical Course as of 10/23/22 0221  Fri Oct 23, 2022  0107 Patient was just in [place] with the symptoms and had a large workup at an outside hospital.  I am unable to get the results, but patient did show me she had a CT angio of her chest that likely ruled out PE.  She was told she had pneumonia but was unable to start antibiotics.  She is overall well-appearing, no acute distress [DW]  0220 Pt improved No hypoxia or tachycardia Labs reveals COVID+  [DW]  0220 Since patient she had a CT scan recently that revealed pneumonia, will continue azithromycin for 4 days.  Patient initially requested Paxlovid However and it does appear to interact with her medications including Biktarvy.  I  feel the risk outweighs any benefit of taking the Paxlovid.  Discussed need for rest and increasing fluids.  We discussed strict return precautions [DW]    Clinical Course User Index [DW] Zadie Rhine, MD                             Medical Decision Making Amount and/or Complexity of Data Reviewed Labs: ordered. Radiology: ordered. ECG/medicine tests: ordered.  Risk Prescription drug management.   This patient presents to the ED for concern of chest pain, this involves an extensive number of treatment options, and is a complaint that carries with it a high risk of complications and morbidity.  The differential diagnosis includes but is not limited to acute coronary syndrome, aortic dissection, pulmonary embolism, pericarditis, pneumothorax, pneumonia, myocarditis, pleurisy, esophageal rupture    Comorbidities that complicate the patient evaluation: Patient's presentation is complicated by their history of HIV  Social Determinants of  Health: Patient's  recent travel   increases the complexity of managing their presentation  Additional history obtained: Additional history obtained from family Records reviewed Care Everywhere/External Records  Lab Tests: I Ordered, and personally interpreted labs.  The pertinent results include:  anemia  Imaging Studies ordered: I ordered imaging studies including X-ray chest   I independently visualized and interpreted imaging which showed infiltrate vs atelectasis I agree with the radiologist interpretation  Cardiac Monitoring: The patient was maintained on a cardiac monitor.  I personally viewed and interpreted the cardiac monitor which showed an underlying rhythm of:  sinus tachycardia  Medicines ordered and prescription drug management: I ordered medication including IV fluids and antibiotics  for pneumonia  Reevaluation of the patient after these medicines showed that the patient    improved   Critical Interventions:   IV fluids and antibiotics   Reevaluation: After the interventions noted above, I reevaluated the patient and found that they have :improved  Complexity of problems addressed: Patient's presentation is most consistent with  acute presentation with potential threat to life or bodily function  Disposition:  After consideration of the diagnostic results and the patient's response to treatment,  I feel that the patent would benefit from discharge   .           Final Clinical Impression(s) / ED Diagnoses Final diagnoses:  COVID-19    Rx / DC Orders ED Discharge Orders          Ordered    azithromycin (ZITHROMAX) 250 MG tablet  Daily        10/23/22 0211              Zadie Rhine, MD 10/23/22 0222

## 2022-10-27 DIAGNOSIS — R079 Chest pain, unspecified: Secondary | ICD-10-CM | POA: Diagnosis not present

## 2022-10-27 DIAGNOSIS — R000 Tachycardia, unspecified: Secondary | ICD-10-CM | POA: Diagnosis not present

## 2022-10-29 DIAGNOSIS — Z79899 Other long term (current) drug therapy: Secondary | ICD-10-CM | POA: Diagnosis not present

## 2022-10-29 DIAGNOSIS — R102 Pelvic and perineal pain: Secondary | ICD-10-CM | POA: Diagnosis not present

## 2022-10-29 DIAGNOSIS — N3010 Interstitial cystitis (chronic) without hematuria: Secondary | ICD-10-CM | POA: Diagnosis not present

## 2022-11-04 NOTE — Unmapped (Signed)
Hi, all - this patient cancelled with me today. She should be scheduled with any available provider for return HIV. Thanks! I met her a while ago but she was seeing Dr. Dolores Frame, so does not have to be with me.     Thank you - Alan Ripper

## 2022-11-16 NOTE — Unmapped (Signed)
Left VM to reschedule with Dr. Rosemarie Beath.

## 2022-12-02 NOTE — Unmapped (Addendum)
Spoke confidentially with patient to schedule appointment for retention. Appointment is set for 01-22-2023. Patient requested an appointment for latest available in the afternoon.    Barriers to Care:  Are you experiencing any barriers to care like: transportation to medical appointments, stable housing, lack of food, etc.? No  Are you currently taking your medication(s) as prescribed? Yes    Notes: Patient indicated that evening appointments work best for her. Patient was previously seeing Dr. Dolores Frame. Patient agreed to be scheduled for follow up with Nat Math.    Referral Outcome: N/A    Duration of services: 15 minutes    Kristen Peterson  Linkage and Insurance claims handler Infectious Diseases[facility]

## 2023-01-22 ENCOUNTER — Ambulatory Visit
Admit: 2023-01-22 | Discharge: 2023-01-23 | Payer: BLUE CROSS/BLUE SHIELD | Attending: Student in an Organized Health Care Education/Training Program | Primary: Student in an Organized Health Care Education/Training Program

## 2023-01-22 DIAGNOSIS — Z9189 Other specified personal risk factors, not elsewhere classified: Secondary | ICD-10-CM | POA: Diagnosis not present

## 2023-01-22 DIAGNOSIS — Z79899 Other long term (current) drug therapy: Secondary | ICD-10-CM | POA: Diagnosis not present

## 2023-01-22 DIAGNOSIS — B009 Herpesviral infection, unspecified: Secondary | ICD-10-CM | POA: Diagnosis not present

## 2023-01-22 DIAGNOSIS — Z23 Encounter for immunization: Secondary | ICD-10-CM | POA: Diagnosis not present

## 2023-01-22 DIAGNOSIS — B20 Human immunodeficiency virus [HIV] disease: Secondary | ICD-10-CM | POA: Diagnosis not present

## 2023-01-22 DIAGNOSIS — Z5181 Encounter for therapeutic drug level monitoring: Secondary | ICD-10-CM | POA: Diagnosis not present

## 2023-01-22 DIAGNOSIS — Z8619 Personal history of other infectious and parasitic diseases: Secondary | ICD-10-CM | POA: Diagnosis not present

## 2023-01-22 LAB — CBC W/ AUTO DIFF
BASOPHILS ABSOLUTE COUNT: 0.1 10*9/L (ref 0.0–0.1)
BASOPHILS RELATIVE PERCENT: 1 %
EOSINOPHILS ABSOLUTE COUNT: 0 10*9/L (ref 0.0–0.5)
EOSINOPHILS RELATIVE PERCENT: 0.5 %
HEMATOCRIT: 35.2 % (ref 34.0–44.0)
HEMOGLOBIN: 11.5 g/dL (ref 11.3–14.9)
LYMPHOCYTES ABSOLUTE COUNT: 1.7 10*9/L (ref 1.1–3.6)
LYMPHOCYTES RELATIVE PERCENT: 29.9 %
MEAN CORPUSCULAR HEMOGLOBIN CONC: 32.6 g/dL (ref 32.0–36.0)
MEAN CORPUSCULAR HEMOGLOBIN: 24.6 pg — ABNORMAL LOW (ref 25.9–32.4)
MEAN CORPUSCULAR VOLUME: 75.6 fL — ABNORMAL LOW (ref 77.6–95.7)
MEAN PLATELET VOLUME: 8.8 fL (ref 6.8–10.7)
MONOCYTES ABSOLUTE COUNT: 0.4 10*9/L (ref 0.3–0.8)
MONOCYTES RELATIVE PERCENT: 6.9 %
NEUTROPHILS ABSOLUTE COUNT: 3.4 10*9/L (ref 1.8–7.8)
NEUTROPHILS RELATIVE PERCENT: 61.7 %
PLATELET COUNT: 324 10*9/L (ref 150–450)
RED BLOOD CELL COUNT: 4.66 10*12/L (ref 3.95–5.13)
RED CELL DISTRIBUTION WIDTH: 17.3 % — ABNORMAL HIGH (ref 12.2–15.2)
WBC ADJUSTED: 5.6 10*9/L (ref 3.6–11.2)

## 2023-01-22 LAB — BASIC METABOLIC PANEL
ANION GAP: 9 mmol/L (ref 5–14)
BLOOD UREA NITROGEN: <5 mg/dL — ABNORMAL LOW (ref 9–23)
CALCIUM: 10.8 mg/dL — ABNORMAL HIGH (ref 8.7–10.4)
CHLORIDE: 106 mmol/L (ref 98–107)
CO2: 24.1 mmol/L (ref 20.0–31.0)
CREATININE: 0.73 mg/dL
EGFR CKD-EPI (2021) FEMALE: >90 mL/min/{1.73_m2} (ref >=60)
GLUCOSE RANDOM: 92 mg/dL (ref 70–179)
POTASSIUM: 3.8 mmol/L (ref 3.4–4.8)
SODIUM: 139 mmol/L (ref 135–145)

## 2023-01-22 LAB — BILIRUBIN, TOTAL: BILIRUBIN TOTAL: 0.4 mg/dL (ref 0.3–1.2)

## 2023-01-22 LAB — ALT: ALT (SGPT): 22 U/L (ref 10–49)

## 2023-01-22 LAB — AST: AST (SGOT): 22 U/L (ref <=34)

## 2023-01-22 MED ORDER — BICTEGRAVIR 50 MG-EMTRICITABINE 200 MG-TENOFOVIR ALAFENAM 25 MG TABLET
ORAL_TABLET | Freq: Every day | ORAL | 3 refills | 90 days | Status: CP
Start: 2023-01-22 — End: 2024-01-22

## 2023-01-22 NOTE — Unmapped (Signed)
Name: Brandy Evans  Date: 01/22/2023  Address: [address]  [place] [address]   [place] of Residence:  [place]  Phone: [phone]     Started assessment with patient options: in clinic    Is this the same address for mailing? No  If no, Mailing Address is:     What is your preferred method of contact? Do not want to be contacted    Is there anyone that you would want to add as your personal contact? No; if yes, please use SmartPhrase RWPersonalContactInfo to gather their contacts information.    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  Family Support and Medical Assistance    List Tax Household Members including relationship to you:   K Myron, Lona- children    Someone in my household receives: No Household Income/Deductions of any kind  Specify who: n/a    Do you or anyone in your home have income adjustments? No    If yes, which adjustments do you have? N/A      Medication Access/Barriers: none    Do you have a current diagnosis for Hepatitis C?  Lab Results   Component Value Date    HEPCAB Nonreactive 03/16/2022       Federal Marketplace Eligibility Assessment  Patient has affordable insurance through [employer], [place], and or Employment and is not eligible.    Patient given ACA education if they qualified based on answers to questions above.     MyChart  Do you have an active MyChart account? Yes     If MyChart is not set up, informed patient on how to set up MyChart N/A    Patient was informed of the following programs;   N/A    The following applications/handouts were given to patient:   N/A    The following forms were also started with the patient: N/A    Medicaid:  Approved      Ryan Cabiness/HMAP application status: Complete    Patient is applying for [employer] on Charges Only     Additional Comments: No issues accessing meds.    Rhetta Mura  ID Clinic Benefits Counselor  Time of Intervention-33mins

## 2023-01-22 NOTE — Unmapped (Signed)
error 

## 2023-01-22 NOTE — Unmapped (Signed)
Infectious Diseases Clinic        HIV (dx'd 2013, nadir CD4 26 / 2% in 03/2021)  Medication history:  -03/21/2015 genotype no resistance (integrase resistance not included)  -01/29/2017 genotype - per provider notes E138A (R-rilpivirine)  -Previous ARVs: TDF/FTC + atazanavir/ritonavir (~2014)  -TAF/FTC + darunavir/cobicistat (Prescobix) (2017)  -TDF/FTC + dolutegravir  -TAF/FTC + dolutegravir most recently, per patient into late 2021 when former providers continued to prescribe (Although at this time she was last seen in [facility] ID clinic 06/11/2017)    -Multiple months off medications at different junctures, major barrier to care has been privacy (bad experiences w/ indiscrete healthcare systems previously) and not wanting to call to make appointments. Patient was reengaged in care via [facility] Counselor in early 2023.    01/22/2023:  Overall things are going well. Current regimen: Biktarvy (BIC/FTC/TAF)  Misses doses of ARVs never    Med access via Medicaid  CD4 count over 100 and VL suppressed on ART >69m; primary prophylaxis against PCP and toxo no longer needed  Discussed ARV adherence    Lab Results   Component Value Date    ACD4 285 (L) 03/16/2022    CD4 19 (L) 03/16/2022    HIVRS Not Detected 03/16/2022    HIVCP <20 (H) 06/11/2021       CD4, HIV RNA, and safety labs (full return panel)  Continue current therapy  Discussed importance of ARV adherence       Anemia   - She has a history of sickle cell trait and chronic anemia    History of genital HSV-1 infection, 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 Discussed secondary prophylaxis, prefers to continue episodic treatment.   To be given prescription for valacyclovir 1g BID for 7-10d during outbreaks     H/o Syphilis and serofast, 01/2012   02/09/2012 RPR 1:64 (CareEverywhere), s/p pen G x3 (11/26, 12/3, 03/15/2012)  - 2016 & 2018 RPR 1:4  - 03/2021 and 02/2022 RPR 1:4    H/o chlamydia, 01/2013  --> Tx with Azithromycin    Pelvic pain, 08/2020 --> extensive work-up done  Interstitial cystitis  - Since last visit in 02/2022:  Seen at [facility] for her pelvic pain --> diagnosed with interstitial cystitis. Patient has a history of recurrent UTIs, small capacity bladder (<200 mL) with severe ulcerations on cystoscopy, and chronic pelvic pain.   Her current medication regimen includes Butrans 10 mcg/h patch and Gabapentin 800 mg 3 times daily, Robaxin (day), Tizanidine (night) and Lidocaine ointment as prescribed outside providers. Last 05/2022: Bladder instillation of Bupivacaine and 300mg  of Elmiron.   - Patient explained that the plan is not to continue with Urology for bladder instillations any longer as her pain is well controlled.     Hypertension, chronic and not on medication  - 10/25: in clinic 154/113; Diastolic pressure usually >100. Patient is asymptomatic but expressed concern that these elevated pressures could affect her in the future.       01/21/2023: Summary of diagnoses and orders for today's visit:   Diagnosis ICD-10-CM Associated Orders   1. History of syphilis  Z86.19       2. HIV disease (CMS-HCC)  B20       3. Therapeutic drug monitoring  Z51.81       4. On highly active antiretroviral therapy (HAART)  Z79.899       5. At risk for adverse drug reaction  Z91.89  6. Need for immunization against influenza  Z23           Disposition  Return in about 6 months (around 07/23/2023) for In-person.    To do @ next RTC  Follow-up if she was able to do Ophthalmology care  Follow-up blood pressure control.    I personally spent 43 minutes face-to-face and non-face-to-face in the care of this patient, which includes all pre, intra, and post visit time on the date of service.  All documented time was specific to the E/M visit and does not include any procedures that may have been performed.      Neita Garnet, MBBS                Chief Complaint   Follow-up care for HIV    HPI  In addition to details in A&P above  Brandy Evans is a 32 y.o. female with HIV here to follow-up for routine care. Recently restarted ART (TAF/FTC/BIC) 03/2021.   Diagnosed 2013 on routine pregnancy screening, previously followed by [facility] ID  History as stated above in A&P.     ROS: Denies cough/cold, fevers, chills.  Denies chest pain, palpitations, dyspnea  GU: Pelvic pain has improved on her current regimen  GI: Denies abdominal pain, nausea, vomiting, diarrhea.  Skin: New rash             BP 154/113 (BP Site: L Arm, BP Position: Sitting, BP Cuff Size: Medium)  - Pulse 114  - Temp 36.8 ??C (98.3 ??F) (Oral)  - Ht 160 cm (5' 3)  - Wt 93.9 kg (207 lb)  - LMP 01/04/2023  - BMI 36.67 kg/m??    Wt Readings from Last 3 Encounters:   01/22/23 93.9 kg (207 lb)   03/16/22 81.3 kg (179 lb 4.8 oz)   08/27/21 77.6 kg (171 lb)       Physical Exam:  Constitutional: No acute distress  Eyes: Lids and lashes normal.  Anicteric sclera.  ENT: Oropharynx without any erythema or exudate.  Moist mucous membranes  Neck: Supple without any enlargement  Cardiovascular: Regular, rate and rhythm without murmurs rubs or gallops.  Palpable distal pulses.  No edema  Pulmonary: Clear to auscultation bilaterally without wheezes/crackles/rhonchi.  Normal work of breathing.  Symmetric chest wall movement.  Abdomen/GI: Soft, nontender, nondistended  Skin/Integument: Warm and dry without rash  Neurologic: Grossly nonfocal.  Alert and oriented to person, place and time.  Cranial nerves grossly intact.  Moves all extremities.  Psychiatry: Appropriate       Health maintenance      Oral health   The patient does or does not have a dentist. Last dental exam this year.    Eye health  The patient does or does not use corrective lenses. Last eye exam patient states she is overdue.    Metabolic conditions  Wt Readings from Last 5 Encounters:   01/22/23 93.9 kg (207 lb)   03/16/22 81.3 kg (179 lb 4.8 oz)   08/27/21 77.6 kg (171 lb)   06/11/21 (P) 64.9 kg (143 lb)     Lab Results Component Value Date    CREATININE 0.78 03/16/2022    PROTEINUA 70 mg/dL (A) 16/12/9602    GLUCOSEU Trace (A) 09/24/2020    GLU 113 03/16/2022    A1C 5.7 (H) 03/16/2022    ALT <7 (L) 03/16/2022    ALT <7 (L) 06/11/2021    ALT <7 (L) 04/23/2021     # Kidney health -  creatinine today  # Bone health - assessment not yet needed (under age 38)   # Diabetes assessment - random glucose today  # NAFLD assessment - suspicion for NAFLD low    Communicable diseases  Lab Results   Component Value Date    QFTTBGOLD Negative 03/16/2022    HEPAIGG Reactive (A) 03/16/2022    HEPBSAB Nonreactive 03/16/2022    HEPCAB Nonreactive 03/16/2022     # TB screening - no longer needed; negative IGRA, low risk  # Hepatitis screening - baseline hepatitis serologies (HAV IgG, HBsAg, HBsAb, HBcTAb, HCV Ab) - Hep A IgG reactive but other serologies negative  # MMR screening - assessment(s) needed but deferred to future visit    Lab Results   Component Value Date    RPR Reactive (A) 03/16/2022    RPR 1:4 03/16/2022    CTNAA Negative 04/23/2021    GCNAA Negative 04/23/2021    SPECSOURCE Urine 04/23/2021     GC/CT NAATs - not being checked routinely for this patient - not sexually active at this time and not in a relationship.  RPR - for screening obtained today     Cancer screening  No results found for: PSASCRN, PSA, PAP, FINALDX  # Cervical -  previously intense pelvic pain made this difficult: for gyne follow-up   # Breast - no indication for screening at present  # Anorectal - not yet done  # Colorectal - screening not indicated  # Liver - no screening indicated  # Lung - screening not indicated    Cardiovascular disease  Lab Results   Component Value Date    CHOL 118 03/16/2022    HDL 37 (L) 03/16/2022    LDL 45 03/16/2022    NONHDL 81 03/16/2022    TRIG 295 (H) 03/16/2022     # The ASCVD Risk score (Arnett DK, et al., 2019) failed to calculate.  - is not taking aspirin   - is not taking statin  - BP control poor  - never smoker  # AAA screening - no indication for screening    Immunization History   Administered Date(s) Administered    COVID-19 VAC,BIVALENT(23YR UP),PFIZER 06/11/2021    COVID-19 VACC,MRNA,(PFIZER)(PF) 07/07/2019, 08/01/2019    DTP 01/05/1991, 06/02/1994    HPV Quadrivalent (Gardasil) 04/15/2015    Haemophilis Influenza Type B Vaccine Hboc 09/16/1990    Hepatitis B Vaccine, Unspecified Formulation 08/03/2001, 09/14/2001, 06/26/2002    HiB, unspecified 08/05/1989, 10/07/1989, 12/03/1989, 01/05/1991    Human Pappilomavirus Vaccine,9-Valent(PF) 03/16/2022    INFLUENZA TIV (TRI) 27MO+ W/ PRESERV (IM) 01/12/2012    Influenza Vaccine Quad(IM)6 MO-Adult(PF) 02/13/2013, 02/11/2017    Influenza Virus Vaccine, unspecified formulation 11/28/2014    MMR 09/16/1990, 06/02/1994    OPV 06/02/1994    PNEUMOCOCCAL POLYSACCHARIDE 23-VALENT 09/01/2011    Pneumococcal Conjugate 20-valent 08/27/2021    Polio Virus Vaccine, Unspecified Formulation 08/12/1989, 10/07/1989, 12/03/1989, 11/15/1990    Td (adult) unspecified formulation 08/03/2001    TdaP 04/16/2017       Immunizations today - influenza    Past Medical History:   Diagnosis Date    Chronic interstitial cystitis     Genital herpes simplex type 1 infection     History of chlamydia 01/2013    History of COVID-19 2022    History of syphilis 01/2012    - 02/09/2012 RPR 1:64 (CareEverywhere), s/p pen G x3 (11/26, 12/3, 03/15/2012)    HIV (human immunodeficiency virus infection) (CMS-HCC) 2013    Diagnosed 2013 on routine  pregnancy screening, previously followed by [facility] ID    Sickle cell trait (CMS-HCC)          Social History  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 Also she has not been in a relationship in that time period .     Medications and Allergies  She has a current medication list which includes the following prescription(s): bictegrav-emtricit-tenofov ala, butrans, calcium carbonate, gabapentin, hydrocortisone, hydroxyzine, oxybutynin, pentosan polysulfate, tizanidine, hydrocodone-acetaminophen, and nystatin.    Allergies: Patient has no known allergies.    Family History  Her family history includes Arthritis in her maternal grandmother; Asthma in her maternal grandmother; Diabetes in her maternal grandmother; Heart attack in her mother; Hypertension in her father, maternal grandmother, and mother; Lupus in her mother; Scleroderma in her mother; Stroke in her maternal grandmother.

## 2023-01-23 LAB — LYMPH MARKER LIMITED,FLOW
ABSOLUTE CD3 CNT: 1224 {cells}/uL (ref 915–3400)
ABSOLUTE CD4 CNT: 527 {cells}/uL (ref 510–2320)
ABSOLUTE CD8 CNT: 663 {cells}/uL (ref 180–1520)
CD3% (T CELLS): 72 % (ref 61–86)
CD4% (T HELPER): 31 % — ABNORMAL LOW (ref 34–58)
CD4:CD8 RATIO: 0.8 — ABNORMAL LOW (ref 0.9–4.8)
CD8% T SUPPRESR: 39 % — ABNORMAL HIGH (ref 12–38)

## 2023-01-23 LAB — HIV RNA, QUANTITATIVE, PCR: HIV RNA QNT RSLT: NOT DETECTED

## 2023-01-23 NOTE — Unmapped (Incomplete)
{  Attestation List for [facility] Teaching Physicians:[id number]}

## 2023-01-23 NOTE — Unmapped (Signed)
It was great to see you today.    The ID clinic phone number is [phone].  The ID clinic fax number is [phone].    Please note that your laboratory and other results may be visible to you in real time, possibly before they reach your provider. Please allow 48 hours for clinical interpretation of these results. Importantly, even if a result is flagged as abnormal, it may not be one that impacts your health.    For urgent issues on nights and weekends you may reach the ID Physician on call through the [facility] Operator at [phone].     URGENT CARE  Please call ahead to speak with the nursing staff if you are in need of an urgent appointment.       MEDICATIONS  For refills please contact your pharmacy and ask them to electronically send or fax the request to the clinic.   Please bring all medications in original bottles to every appointment.    HMAP (formerly ADAP) or [employer] Eligibility (required even if you do not receive medication through [facility])  Please remember to renew your Yoni Sako eligibility during renewal periods which occur twice a year: January-March and July-September.     The following are needed for each renewal:   - [facility] Identification (if you don't have one, then a bill with your name and address in [place])   - proof of income (award letter, W-2, or last three check stubs)   If you are unable to come in for renewal, let us know if we can mail, fax or e-mail paperwork to you.   HMAP Contact: [phone].     Lab info:  Your most recent CD4 T-cell counts and viral loads are below. Here are a few things to keep in mind when looking at your numbers:  Our goal is to get your virus to be undetectable and keep it undetectable. If the virus is undetectable you are much more likely to stay healthy.  We consider your viral load to be undetectable if it says <40 or if it says Not detected.  For most people, we're checking CD4 counts every other visit (once or twice a year, or sometimes even less).  It's normal for your CD4 count to be different from visit to visit.   You can help by taking your medications at about the same time, every single day. If you're having trouble with taking your medications, it's important to let us know.    Lab Results   Component Value Date    ACD4 285 (L) 03/16/2022    CD4 19 (L) 03/16/2022    HIVCP <20 (H) 06/11/2021    HIVRS Not Detected 03/16/2022       Neita Garnet, MBBS    [place] [facility] at [facility] Phone: [phone]

## 2023-01-25 LAB — RPR, THERAPY MONITORING: SYPHILIS RPR SCREEN: REACTIVE — AB

## 2023-01-27 NOTE — Unmapped (Signed)
 Patient completed RW paperwork. Patient is eligible for RW B&C grant services and [employer] on Charges. IPL= 0%;FPL= 0%. Expires: 03/29/2024- Rw0    Rhetta Mura  ID Clinic Benefits Counselor  Time of Intervention-20mins

## 2023-01-28 DIAGNOSIS — R102 Pelvic and perineal pain: Secondary | ICD-10-CM | POA: Diagnosis not present

## 2023-01-28 DIAGNOSIS — Z79899 Other long term (current) drug therapy: Secondary | ICD-10-CM | POA: Diagnosis not present

## 2023-01-28 DIAGNOSIS — N3010 Interstitial cystitis (chronic) without hematuria: Secondary | ICD-10-CM | POA: Diagnosis not present

## 2023-01-29 NOTE — Unmapped (Signed)
Specialty Medication(s): Biktarvy 50-200-25mg     Brandy Evans has been dis-enrolled from the [employer] and [facility] specialty pharmacy services due to a pharmacy change. The patient is now filling at the following pharmacy:     [facility] [address], [place] - [address]  [address] [place] [address]  Phone: [phone]  Fax: [phone]       Additional information provided to the patient: n/a    Roderic Palau, PharmD  [facility] Specialty Pharmacist

## 2023-01-30 DIAGNOSIS — Z79899 Other long term (current) drug therapy: Secondary | ICD-10-CM | POA: Diagnosis not present

## 2023-02-04 DIAGNOSIS — Z124 Encounter for screening for malignant neoplasm of cervix: Secondary | ICD-10-CM | POA: Diagnosis not present

## 2023-02-04 DIAGNOSIS — R87810 Cervical high risk human papillomavirus (HPV) DNA test positive: Secondary | ICD-10-CM | POA: Diagnosis not present

## 2023-02-04 DIAGNOSIS — N3010 Interstitial cystitis (chronic) without hematuria: Secondary | ICD-10-CM | POA: Insufficient documentation

## 2023-02-04 DIAGNOSIS — Z0000 Encounter for general adult medical examination without abnormal findings: Secondary | ICD-10-CM | POA: Diagnosis not present

## 2023-02-04 DIAGNOSIS — R638 Other symptoms and signs concerning food and fluid intake: Secondary | ICD-10-CM | POA: Insufficient documentation

## 2023-02-04 DIAGNOSIS — A6000 Herpesviral infection of urogenital system, unspecified: Secondary | ICD-10-CM | POA: Insufficient documentation

## 2023-02-23 ENCOUNTER — Ambulatory Visit
Admission: EM | Admit: 2023-02-23 | Discharge: 2023-02-23 | Disposition: A | Discharge status: Home / Self Care | Payer: Medicaid Other | Attending: Internal Medicine | Admitting: Internal Medicine

## 2023-02-23 ENCOUNTER — Ambulatory Visit (INDEPENDENT_AMBULATORY_CARE_PROVIDER_SITE_OTHER): Payer: Medicaid Other

## 2023-02-23 DIAGNOSIS — R051 Acute cough: Secondary | ICD-10-CM | POA: Diagnosis not present

## 2023-02-23 DIAGNOSIS — R059 Cough, unspecified: Secondary | ICD-10-CM | POA: Diagnosis not present

## 2023-02-23 DIAGNOSIS — R030 Elevated blood-pressure reading, without diagnosis of hypertension: Secondary | ICD-10-CM | POA: Diagnosis not present

## 2023-02-23 DIAGNOSIS — J029 Acute pharyngitis, unspecified: Secondary | ICD-10-CM

## 2023-02-23 DIAGNOSIS — J22 Unspecified acute lower respiratory infection: Secondary | ICD-10-CM

## 2023-02-23 DIAGNOSIS — R509 Fever, unspecified: Secondary | ICD-10-CM | POA: Diagnosis not present

## 2023-02-23 DIAGNOSIS — R0602 Shortness of breath: Secondary | ICD-10-CM | POA: Diagnosis not present

## 2023-02-23 LAB — POC COVID19/FLU A&B COMBO
Covid Antigen, POC: NEGATIVE
Influenza A Antigen, POC: NEGATIVE
Influenza B Antigen, POC: NEGATIVE

## 2023-02-23 LAB — POCT RAPID STREP A (OFFICE): Rapid Strep A Screen: NEGATIVE

## 2023-02-23 MED ORDER — AZITHROMYCIN 250 MG PO TABS: 250.0000 mg | ORAL_TABLET | Freq: Every day | ORAL | 0 refills | Status: DC

## 2023-02-23 MED ORDER — BENZONATATE 200 MG PO CAPS: 200.0000 mg | ORAL_CAPSULE | Freq: Three times a day (TID) | ORAL | 0 refills | Status: DC | PRN

## 2023-02-23 MED ORDER — ALBUTEROL SULFATE HFA 108 (90 BASE) MCG/ACT IN AERS: 1.0000 | INHALATION_SPRAY | Freq: Four times a day (QID) | RESPIRATORY_TRACT | 0 refills | Status: AC | PRN

## 2023-02-23 NOTE — ED Triage Notes (Signed)
Pt presents to UC for c/o cough, headache, hypertension, sob, fever, body aches x2 days.  Home remedies: Tylenol, cough medicine

## 2023-02-23 NOTE — ED Provider Notes (Signed)
[facility]    CSN: [id number] Arrival date & time: 02/23/23  1507      History   Chief Complaint No chief complaint on file.   HPI Tonya Weeks is a 32 y.o. female  presents for evaluation of URI symptoms for 2 days.  Patient has a past medical history of HIV, sickle cell trait, anemia.  Patient reports associated symptoms of cough, congestion, headache, sore throat, shortness of breath, body aches, fevers. Denies N/V/D, ear pain. Patient does not have a hx of asthma. Patient does not have a history of smoking.  Reports no sick contacts.  Pt has taken Tylenol and cough medicine OTC for symptoms.  Denies history of hypertension.  BP elevated on intake but patient denies chest pain.  Pt has no other concerns at this time.   HPI  Past Medical History:  Diagnosis Date   Anemia    History of syphilis    Treated during last pregnancy    HIV infection (HCC)    HSV infection    Pelvic floor dysfunction    Sickle cell trait [facility])     Patient Active Problem List   Diagnosis Date Noted   Pregnancy 07/09/2017   Encounter for elective induction of labor 07/08/2017   Nausea & vomiting 07/05/2017   Pregnancy and infectious disease in third trimester 02/11/2017   Hypertension in pregnancy 04/15/2015   Encounter for long-term (current) use of other medications 10/12/2013   Health care maintenance 10/12/2013   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 Authorizing Provider   albuterol (VENTOLIN HFA) 108 (90 Base) MCG/ACT inhaler Inhale 1-2 puffs into the lungs every 6 (six) hours as needed for wheezing or shortness of breath. 02/23/23  Yes Radford Pax, NP  azithromycin (ZITHROMAX) 250 MG tablet Take 1 tablet (250 mg total) by mouth daily. Take first 2 tablets together, then 1 every day until finished. 02/23/23  Yes Radford Pax, NP  benzonatate (TESSALON) 200 MG capsule Take 1 capsule (200 mg total) by mouth 3 (three) times daily as needed. 02/23/23  Yes Radford Pax, NP  DESCOVY 200-25 MG tablet Take 1 tablet by mouth daily. 06/14/17   [provider]  ELMIRON 100 MG capsule Take 100 mg by mouth 3 (three) times daily. 12/17/20   [provider]  Emtricitabine-Tenofovir DF (TRUVADA PO) Take 1 tablet by mouth daily.    [provider]  gabapentin (NEURONTIN) 300 MG capsule Take 600 mg by mouth 3 (three) times daily. 12/19/20   [provider]  hydrocortisone (ANUSOL-HC) 25 MG suppository Place 1 suppository (25 mg total) rectally 2 (two) times daily. 01/15/21   Caccavale, Sophia, PA-C  hydrOXYzine (ATARAX/VISTARIL) 25 MG tablet Take  1 tablet (25 mg total) by mouth nightly. 1-2 one hour before sleep 12/05/20   [provider]  lidocaine (LMX) 4 % cream Apply 1 application topically as needed. Patient taking differently: Apply 1 application topically daily as needed (for bodyaches). 09/17/20   Joy, Shawn C, PA-C  naproxen sodium (ALEVE) 220 MG tablet Take 440 mg by mouth daily as needed (pain).    [provider]  nystatin (MYCOSTATIN) 100000 UNIT/ML suspension Take 5 mLs (500,000 Units total) by mouth 4 (four) times daily. 01/15/21   Caccavale, Sophia, PA-C  PRESCRIPTION MEDICATION Diazepam vaginal suppository: Insert suppository into the vagina at bedtime as needed for severe pain    [provider]    Family History Family History  Problem Relation Age of Onset   Lupus Mother    Hypertension Mother     Heart failure Mother    Heart attack Mother    Sickle cell trait Mother    Scleroderma Mother    Hypertension Father    Arthritis Maternal Grandmother    Diabetes Maternal Grandmother    Hypertension Maternal Grandmother    Stroke Maternal Grandmother    Heart failure Maternal Grandmother    Heart attack Maternal Grandmother    Asthma Maternal Grandmother    Arthritis Maternal Grandfather    Diabetes Maternal Grandfather    Hypertension Maternal Grandfather    Stroke Maternal Grandfather    Heart failure Maternal Grandfather    Heart attack Maternal Grandfather    Kidney disease Maternal Grandfather    Other Neg Hx     Social History Social History   Tobacco Use   Smoking status: Never   Smokeless tobacco: Never  Vaping Use   Vaping status: Never Used  Substance Use Topics   Alcohol use: Yes    Comment: Socially    Drug use: No     Allergies   Patient has no known allergies.   Review of Systems Review of Systems  Constitutional:  Positive for fever.  HENT:  Positive for congestion and sore throat.   Respiratory:  Positive for cough and shortness of breath.   Musculoskeletal:  Positive for myalgias.  Neurological:  Positive for headaches.     Physical Exam Triage Vital Signs ED Triage Vitals  Encounter Vitals Group     BP 02/23/23 1609 (!) 153/119     Systolic BP Percentile --      Diastolic BP Percentile --      Pulse Rate 02/23/23 1609 (!) 124     Resp 02/23/23 1609 16     Temp 02/23/23 1609 98.6 F (37 C)     Temp Source 02/23/23 1609 Oral     SpO2 02/23/23 1609 99 %     Weight --      Height --      Head Circumference --      Peak Flow --      Pain Score 02/23/23 1606 8     Pain Loc --      Pain Education --      Exclude from Growth Chart --    No data found.  Updated Vital Signs BP (!) 155/99 (BP Location: Right Arm)   Pulse (!) 124   Temp 98.6 F (37 C) (Oral)   Resp 16   LMP 01/20/2023 (Approximate)   SpO2 99%   Visual  Acuity Right Eye Distance:   Left Eye Distance:   Bilateral Distance:    Right Eye Near:   Left  Eye Near:    Bilateral Near:     Physical Exam Vitals and nursing note reviewed.  Constitutional:      General: She is not in acute distress.    Appearance: She is well-developed. She is not ill-appearing.  HENT:     Head: Normocephalic and atraumatic.     Right Ear: Tympanic membrane and ear canal normal.     Left Ear: Tympanic membrane and ear canal normal.     Nose: Congestion present.     Mouth/Throat:     Mouth: Mucous membranes are moist.     Pharynx: Oropharynx is clear. Uvula midline. Posterior oropharyngeal erythema present.     Tonsils: No tonsillar exudate or tonsillar abscesses.  Eyes:     Conjunctiva/sclera: Conjunctivae normal.     Pupils: Pupils are equal, round, and reactive to light.  Cardiovascular:     Rate and Rhythm: Normal rate and regular rhythm.     Heart sounds: Normal heart sounds.  Pulmonary:     Effort: Pulmonary effort is normal. No respiratory distress.     Breath sounds: Normal breath sounds. No stridor. No wheezing, rhonchi or rales.  Musculoskeletal:     Cervical back: Normal range of motion and neck supple.  Lymphadenopathy:     Cervical: No cervical adenopathy.  Skin:    General: Skin is warm and dry.  Neurological:     General: No focal deficit present.     Mental Status: She is alert and oriented to person, place, and time.  Psychiatric:        Mood and Affect: Mood normal.        Behavior: Behavior normal.      UC Treatments / Results  Labs (all labs ordered are listed, but only abnormal results are displayed) Labs Reviewed  POCT RAPID STREP A (OFFICE)  POC COVID19/FLU A&B COMBO    EKG   Radiology No results found.  Procedures Procedures (including critical care time)  Medications Ordered in UC Medications - No data to display  Initial Impression / Assessment and Plan / UC Course  I have reviewed the triage vital  signs and the nursing notes.  Pertinent labs & imaging results that were available during my care of the patient were reviewed by me and considered in my medical decision making (see chart for details).      I reviewed exam and symptoms with patient.  No red flags.  Negative rapid strep, flu, COVID testing.  Chest x-ray with possible right lower lobe consolidation.  Will start Zithromax given patient history and symptoms.  Tessalon as needed for cough.  Albuterol inhaler as needed for shortness of breath.  Patient with elevated BP without history of hypertension.  Advised to monitor/check at home and follow-up with PCP regarding this.  DASH diet reviewed.  PCP follow-up if symptoms do not improve.  ER precautions reviewed. Final Clinical Impressions(s) / UC Diagnoses   Final diagnoses:  Acute cough  Sore throat  Lower respiratory infection  Elevated BP without diagnosis of hypertension     Discharge Instructions      Start Zithromax as prescribed.  Tessalon as needed for cough.  Albuterol inhaler as needed for shortness of breath.  Lots of rest and fluids.  Please follow-up with your PCP if your symptoms do not improve.  Please go to the ER for any worsening symptoms.  I hope you feel better soon!     ED Prescriptions     Medication Sig Dispense Auth.  Provider   azithromycin (ZITHROMAX) 250 MG tablet Take 1 tablet (250 mg total) by mouth daily. Take first 2 tablets together, then 1 every day until finished. 6 tablet Radford Pax, NP   benzonatate (TESSALON) 200 MG capsule Take 1 capsule (200 mg total) by mouth 3 (three) times daily as needed. 20 capsule Radford Pax, NP   albuterol (VENTOLIN HFA) 108 (90 Base) MCG/ACT inhaler Inhale 1-2 puffs into the lungs every 6 (six) hours as needed for wheezing or shortness of breath. 1 each Radford Pax, NP      PDMP not reviewed this encounter.   Radford Pax, NP 02/23/23 1714

## 2023-02-23 NOTE — Discharge Instructions (Addendum)
Start Zithromax as prescribed.  Tessalon as needed for cough.  Albuterol inhaler as needed for shortness of breath.  Lots of rest and fluids.  Please monitor your blood pressure at home and follow-up with your PCP.  Please follow-up with your PCP if your symptoms do not improve.  Please go to the ER for any worsening symptoms.  I hope you feel better soon!

## 2023-03-01 DIAGNOSIS — I10 Essential (primary) hypertension: Secondary | ICD-10-CM | POA: Diagnosis not present

## 2023-03-01 DIAGNOSIS — L709 Acne, unspecified: Secondary | ICD-10-CM | POA: Diagnosis not present

## 2023-04-06 ENCOUNTER — Encounter (HOSPITAL_BASED_OUTPATIENT_CLINIC_OR_DEPARTMENT_OTHER): Payer: Self-pay | Admitting: Obstetrics and Gynecology

## 2023-04-06 ENCOUNTER — Other Ambulatory Visit: Payer: Self-pay

## 2023-04-06 DIAGNOSIS — R102 Pelvic and perineal pain: Secondary | ICD-10-CM | POA: Diagnosis not present

## 2023-04-06 DIAGNOSIS — Z79899 Other long term (current) drug therapy: Secondary | ICD-10-CM | POA: Diagnosis not present

## 2023-04-06 DIAGNOSIS — N3010 Interstitial cystitis (chronic) without hematuria: Secondary | ICD-10-CM | POA: Diagnosis not present

## 2023-04-06 NOTE — Progress Notes (Signed)
 Spoke w/ via phone for pre-op interview---Tonya Weeks needs dos----ISTAT, UPT         Weeks results------02/23/23 Chest xray, 10/22/22 EKG in Epic & chart COVID test -----patient states asymptomatic no test needed Arrive at -------0730 on Friday, 04/09/23 NPO after MN NO Solid Food.  Clear liquids from MN until---0630 Med rec completed Medications to take morning of surgery -----Elmiron, Gabapentin, Amlodipine, Butrans pain patch (currently on lower back per patient), Albuterol  inhaler prn Diabetic medication -----n/a Patient instructed no nail polish to be worn day of surgery Patient instructed to bring photo id and insurance card day of surgery Patient aware to have Driver (ride ) / caregiver    for 24 hours after surgery - mother, Tonya Weeks Patient Special Instructions -----Bring albuterol  inhaler. Pre-Op special Instructions -----none Patient verbalized understanding of instructions that were given at this phone interview. Patient denies chest pain, sob, fever, cough at the interview.

## 2023-04-09 ENCOUNTER — Encounter (HOSPITAL_BASED_OUTPATIENT_CLINIC_OR_DEPARTMENT_OTHER): Payer: Self-pay | Admitting: Obstetrics and Gynecology

## 2023-04-09 ENCOUNTER — Ambulatory Visit (HOSPITAL_BASED_OUTPATIENT_CLINIC_OR_DEPARTMENT_OTHER): Payer: Medicaid Other | Admitting: Anesthesiology

## 2023-04-09 ENCOUNTER — Other Ambulatory Visit: Payer: Self-pay

## 2023-04-09 ENCOUNTER — Encounter (HOSPITAL_BASED_OUTPATIENT_CLINIC_OR_DEPARTMENT_OTHER)
Admission: RE | Disposition: A | Discharge status: Home / Self Care | Payer: Self-pay | Source: Home / Self Care | Attending: Obstetrics and Gynecology

## 2023-04-09 ENCOUNTER — Ambulatory Visit (HOSPITAL_BASED_OUTPATIENT_CLINIC_OR_DEPARTMENT_OTHER)
Admission: RE | Admit: 2023-04-09 | Discharge: 2023-04-09 | Disposition: A | Discharge status: Home / Self Care | Payer: Medicaid Other | Attending: Obstetrics and Gynecology | Admitting: Obstetrics and Gynecology

## 2023-04-09 DIAGNOSIS — I10 Essential (primary) hypertension: Secondary | ICD-10-CM | POA: Insufficient documentation

## 2023-04-09 DIAGNOSIS — Z01818 Encounter for other preprocedural examination: Secondary | ICD-10-CM

## 2023-04-09 DIAGNOSIS — Z21 Asymptomatic human immunodeficiency virus [HIV] infection status: Secondary | ICD-10-CM | POA: Insufficient documentation

## 2023-04-09 DIAGNOSIS — R87611 Atypical squamous cells cannot exclude high grade squamous intraepithelial lesion on cytologic smear of cervix (ASC-H): Secondary | ICD-10-CM | POA: Diagnosis not present

## 2023-04-09 DIAGNOSIS — Z79899 Other long term (current) drug therapy: Secondary | ICD-10-CM | POA: Diagnosis not present

## 2023-04-09 DIAGNOSIS — N870 Mild cervical dysplasia: Secondary | ICD-10-CM | POA: Diagnosis not present

## 2023-04-09 DIAGNOSIS — D573 Sickle-cell trait: Secondary | ICD-10-CM | POA: Insufficient documentation

## 2023-04-09 DIAGNOSIS — R87610 Atypical squamous cells of undetermined significance on cytologic smear of cervix (ASC-US): Secondary | ICD-10-CM

## 2023-04-09 HISTORY — DX: Presence of spectacles and contact lenses: Z97.3

## 2023-04-09 HISTORY — PX: COLPOSCOPY: SHX161

## 2023-04-09 HISTORY — DX: Essential (primary) hypertension: I10

## 2023-04-09 HISTORY — DX: Headache, unspecified: R51.9

## 2023-04-09 HISTORY — DX: Interstitial cystitis (chronic) without hematuria: N30.10

## 2023-04-09 LAB — POCT I-STAT, CHEM 8
BUN: 9 mg/dL (ref 6–20)
Calcium, Ion: 1.21 mmol/L (ref 1.15–1.40)
Chloride: 104 mmol/L (ref 98–111)
Creatinine, Ser: 0.9 mg/dL (ref 0.44–1.00)
Glucose, Bld: 92 mg/dL (ref 70–99)
HCT: 39 % (ref 36.0–46.0)
Hemoglobin: 13.3 g/dL (ref 12.0–15.0)
Potassium: 4.8 mmol/L (ref 3.5–5.1)
Sodium: 140 mmol/L (ref 135–145)
TCO2: 26 mmol/L (ref 22–32)

## 2023-04-09 LAB — POCT PREGNANCY, URINE: Preg Test, Ur: NEGATIVE

## 2023-04-09 SURGERY — COLPOSCOPY
Anesthesia: General | Site: Cervix

## 2023-04-09 MED ORDER — FENTANYL CITRATE (PF) 100 MCG/2ML IJ SOLN: 25.0000 ug | INTRAMUSCULAR | Status: DC | PRN

## 2023-04-09 MED ORDER — FENTANYL CITRATE (PF) 100 MCG/2ML IJ SOLN
INTRAMUSCULAR | Status: AC
  Filled 2023-04-09: qty 2

## 2023-04-09 MED ORDER — ACETAMINOPHEN 325 MG PO TABS: 325.0000 mg | ORAL_TABLET | ORAL | Status: DC | PRN

## 2023-04-09 MED ORDER — OXYCODONE HCL 5 MG PO TABS: 5.0000 mg | ORAL_TABLET | Freq: Once | ORAL | Status: DC | PRN

## 2023-04-09 MED ORDER — LIDOCAINE HCL (PF) 2 % IJ SOLN
INTRAMUSCULAR | Status: AC
  Filled 2023-04-09: qty 5

## 2023-04-09 MED ORDER — SCOPOLAMINE 1 MG/3DAYS TD PT72
MEDICATED_PATCH | TRANSDERMAL | Status: AC
  Filled 2023-04-09: qty 1

## 2023-04-09 MED ORDER — FENTANYL CITRATE (PF) 100 MCG/2ML IJ SOLN
INTRAMUSCULAR | Status: DC | PRN
  Administered 2023-04-09 (×2): 50 ug via INTRAVENOUS

## 2023-04-09 MED ORDER — ONDANSETRON HCL 4 MG/2ML IJ SOLN
INTRAMUSCULAR | Status: DC | PRN
  Administered 2023-04-09: 4 mg via INTRAVENOUS

## 2023-04-09 MED ORDER — DROPERIDOL 2.5 MG/ML IJ SOLN: 0.6250 mg | Freq: Once | INTRAMUSCULAR | Status: DC | PRN

## 2023-04-09 MED ORDER — LIDOCAINE HCL (CARDIAC) PF 100 MG/5ML IV SOSY
PREFILLED_SYRINGE | INTRAVENOUS | Status: DC | PRN
  Administered 2023-04-09: 60 mg via INTRAVENOUS

## 2023-04-09 MED ORDER — MONSELS FERRIC SUBSULFATE EX SOLN
CUTANEOUS | Status: DC | PRN
  Administered 2023-04-09: 1 via TOPICAL

## 2023-04-09 MED ORDER — LIDOCAINE HCL 1 % IJ SOLN
INTRAMUSCULAR | Status: DC | PRN
  Administered 2023-04-09: 10 mL

## 2023-04-09 MED ORDER — ACETAMINOPHEN 10 MG/ML IV SOLN
1000.0000 mg | Freq: Once | INTRAVENOUS | Status: DC | PRN
Start: 2023-04-09 — End: 2023-04-09

## 2023-04-09 MED ORDER — SODIUM CHLORIDE 0.9 % IV SOLN: INTRAVENOUS | Status: DC

## 2023-04-09 MED ORDER — LACTATED RINGERS IV SOLN: INTRAVENOUS | Status: DC

## 2023-04-09 MED ORDER — DEXAMETHASONE SODIUM PHOSPHATE 10 MG/ML IJ SOLN
INTRAMUSCULAR | Status: AC
  Filled 2023-04-09: qty 1

## 2023-04-09 MED ORDER — POVIDONE-IODINE 10 % EX SWAB
2.0000 | Freq: Once | CUTANEOUS | Status: DC
Start: 2023-04-09 — End: 2023-04-09

## 2023-04-09 MED ORDER — SCOPOLAMINE 1 MG/3DAYS TD PT72
1.0000 | MEDICATED_PATCH | TRANSDERMAL | Status: DC
  Administered 2023-04-09: 1.5 mg via TRANSDERMAL

## 2023-04-09 MED ORDER — PROPOFOL 10 MG/ML IV BOLUS
INTRAVENOUS | Status: DC | PRN
  Administered 2023-04-09: 200 mg via INTRAVENOUS

## 2023-04-09 MED ORDER — KETOROLAC TROMETHAMINE 30 MG/ML IJ SOLN
INTRAMUSCULAR | Status: DC | PRN
  Administered 2023-04-09: 30 mg via INTRAVENOUS

## 2023-04-09 MED ORDER — PROPOFOL 10 MG/ML IV BOLUS
INTRAVENOUS | Status: AC
  Filled 2023-04-09: qty 20

## 2023-04-09 MED ORDER — ACETIC ACID 4% SOLUTION
Status: DC | PRN
  Administered 2023-04-09: 1 via TOPICAL

## 2023-04-09 MED ORDER — ACETAMINOPHEN 160 MG/5ML PO SOLN: 325.0000 mg | ORAL | Status: DC | PRN

## 2023-04-09 MED ORDER — ONDANSETRON HCL 4 MG/2ML IJ SOLN
INTRAMUSCULAR | Status: AC
  Filled 2023-04-09: qty 2

## 2023-04-09 MED ORDER — MIDAZOLAM HCL 2 MG/2ML IJ SOLN
INTRAMUSCULAR | Status: AC
Start: 2023-04-09 — End: ?
  Filled 2023-04-09: qty 2

## 2023-04-09 MED ORDER — MIDAZOLAM HCL 5 MG/5ML IJ SOLN
INTRAMUSCULAR | Status: DC | PRN
  Administered 2023-04-09: 2 mg via INTRAVENOUS

## 2023-04-09 MED ORDER — KETOROLAC TROMETHAMINE 30 MG/ML IJ SOLN
INTRAMUSCULAR | Status: AC
  Filled 2023-04-09: qty 1

## 2023-04-09 MED ORDER — OXYCODONE HCL 5 MG/5ML PO SOLN: 5.0000 mg | Freq: Once | ORAL | Status: DC | PRN

## 2023-04-09 MED ORDER — DEXAMETHASONE SODIUM PHOSPHATE 4 MG/ML IJ SOLN
INTRAMUSCULAR | Status: DC | PRN
  Administered 2023-04-09: 5 mg via INTRAVENOUS

## 2023-04-09 SURGICAL SUPPLY — 11 items
APPLICATOR COTTON TIP 6 STRL (MISCELLANEOUS) IMPLANT
APPLICATOR COTTON TIP 6IN STRL (MISCELLANEOUS) IMPLANT
GLOVE BIOGEL PI IND STRL 6.5 (GLOVE) ×1 IMPLANT
GLOVE ECLIPSE 6.5 STRL STRAW (GLOVE) ×1 IMPLANT
GOWN STRL REUS W/ TWL LRG LVL3 (GOWN DISPOSABLE) ×2 IMPLANT
KIT TURNOVER CYSTO (KITS) ×1 IMPLANT
NS IRRIG 1000ML POUR BTL (IV SOLUTION) ×1 IMPLANT
PACK VAGINAL MINOR WOMEN LF (CUSTOM PROCEDURE TRAY) ×1 IMPLANT
PENCIL SMOKE EVACUATOR (MISCELLANEOUS) IMPLANT
SCOPETTES 8 STERILE (MISCELLANEOUS) IMPLANT
TOWEL OR 17X26 10 PK STRL BLUE (TOWEL DISPOSABLE) ×2 IMPLANT

## 2023-04-09 NOTE — Anesthesia Postprocedure Evaluation (Signed)
 Anesthesia Post Note  Patient: Tonya Weeks  Procedure(s) Performed: COLPOSCOPY (Cervix)     Patient location during evaluation: PACU Anesthesia Type: General Level of consciousness: awake and alert Pain management: pain level controlled Vital Signs Assessment: post-procedure vital signs reviewed and stable Respiratory status: spontaneous breathing, nonlabored ventilation, respiratory function stable and patient connected to nasal cannula oxygen Cardiovascular status: blood pressure returned to baseline and stable Postop Assessment: no apparent nausea or vomiting Anesthetic complications: no  No notable events documented.  Last Vitals:  Vitals:   04/09/23 1030 04/09/23 1100  BP: (!) 137/100 (!) 132/96  Pulse: 85 87  Resp: 19 17  Temp:  36.6 C  SpO2: 98% 99%    Last Pain:  Vitals:   04/09/23 1030  TempSrc:   PainSc: 0-No pain                 [name]

## 2023-04-09 NOTE — H&P (Signed)
 33 y.o. [id number] presents for colposcopy under anesthesia.  She preferred to have performed under anesthesia d/t concern for discomfort.  Recent pap from 01/2023 showed ASCUS/ HPV+  Past Medical History:  Diagnosis Date   Anemia    chronic, s/p blood transfusion (Patient unsure when)   Headache    takes OTC   History of syphilis    Treated during last pregnancy    HIV infection (HCC)    Follows w/ [facility].   HSV infection    Hypertension    Follows w/ PCP, Dr. Arthur Reese.   Interstitial cystitis    Follows w/ [facility].   Pelvic floor dysfunction    Sickle cell trait (HCC)    Wears glasses    Past Surgical History:  Procedure Laterality Date   DILATION AND CURETTAGE, DIAGNOSTIC / THERAPEUTIC      Social History   Socioeconomic History   Marital status: Single    Spouse name: Not on file   Number of children: Not on file   Years of education: Not on file   Highest education level: Not on file  Occupational History   Not on file  Tobacco Use   Smoking status: Never   Smokeless tobacco: Never   Tobacco comments:    Smokes Hooka occasionally.  Vaping Use   Vaping status: Never Used  Substance and Sexual Activity   Alcohol use: Not Currently   Drug use: No   Sexual activity: Not on file  Other Topics Concern   Not on file  Social History Narrative   Not on file   Social Drivers of Health   Financial Resource Strain: Not on file  Food Insecurity: Not on file  Transportation Needs: Not on file  Physical Activity: Not on file  Stress: Not on file  Social Connections: Not on file  Intimate Partner Violence: Not on file    No current facility-administered medications on file prior to encounter.   Current Outpatient Medications on File Prior to Encounter  Medication Sig Dispense Refill   albuterol  (VENTOLIN  HFA) 108 (90 Base) MCG/ACT inhaler Inhale 1-2 puffs into the lungs every 6 (six) hours as needed for wheezing or shortness of breath. 1  each 0   amLODipine (NORVASC) 5 MG tablet Take 5 mg by mouth daily.     APPLE CIDER VINEGAR PO Take by mouth. gummies     Buprenorphine (BUTRANS TD) Place onto the skin. Every 7 days     DESCOVY  200-25 MG tablet Take 1 tablet by mouth at bedtime.  5   ELMIRON 100 MG capsule Take 100 mg by mouth 3 (three) times daily.     gabapentin (NEURONTIN) 300 MG capsule Take 800 mg by mouth 3 (three) times daily.     hydrOXYzine (ATARAX/VISTARIL) 25 MG tablet Take 1 tablet (25 mg total) by mouth nightly. 1-2 one hour before sleep     lidocaine  (LMX) 4 % cream Apply 1 application topically as needed. (Patient taking differently: Apply 1 application  topically daily as needed (for bodyaches).) 30 g 0   Multiple Vitamin (MULTIVITAMIN WITH MINERALS) TABS tablet Take 1 tablet by mouth daily.     naproxen  sodium (ALEVE ) 220 MG tablet Take 440 mg by mouth daily as needed (pain).      No Known Allergies  Vitals:   04/06/23 1242  Weight: 86.2 kg  Height: 5' 4 (1.626 m)   Per pt office exam Lungs: clear to ascultation Cor:  RRR Abdomen:  soft,  nontender, nondistended. Ex:  no cords, erythema Pelvic:  deferred to OR  A/P: All risks, benefits and alternatives d/w patient and she desires to proceed.  Will proceed with colposcopy under anesthesia. Routine pre- op care.

## 2023-04-09 NOTE — Anesthesia Preprocedure Evaluation (Addendum)
 Anesthesia Evaluation  Patient identified by MRN, date of birth, ID band Patient awake    Reviewed: Allergy & Precautions, NPO status , Patient's Chart, lab work & pertinent test results  Airway Mallampati: I  TM Distance: >3 FB Neck ROM: Full    Dental  (+) Teeth Intact, Dental Advisory Given   Pulmonary    breath sounds clear to auscultation       Cardiovascular hypertension, Pt. on medications  Rhythm:Regular Rate:Normal     Neuro/Psych  Headaches PSYCHIATRIC DISORDERS  Depression       GI/Hepatic negative GI ROS, Neg liver ROS,,,  Endo/Other  negative endocrine ROS    Renal/GU negative Renal ROS     Musculoskeletal negative musculoskeletal ROS (+)    Abdominal   Peds  Hematology  (+) Blood dyscrasia, Sickle cell trait and anemia , HIV  Anesthesia Other Findings   Reproductive/Obstetrics                             Anesthesia Physical Anesthesia Plan  ASA: 2  Anesthesia Plan: General   Post-op Pain Management: Tylenol  PO (pre-op)* and Toradol  IV (intra-op)*   Induction: Intravenous  PONV Risk Score and Plan: 4 or greater and Ondansetron , Dexamethasone , Scopolamine  patch - Pre-op and Midazolam   Airway Management Planned: LMA  Additional Equipment: None  Intra-op Plan:   Post-operative Plan: Extubation in OR  Informed Consent: I have reviewed the patients History and Physical, chart, labs and discussed the procedure including the risks, benefits and alternatives for the proposed anesthesia with the patient or authorized representative who has indicated his/her understanding and acceptance.     Dental advisory given  Plan Discussed with: CRNA  Anesthesia Plan Comments:        Anesthesia Quick Evaluation

## 2023-04-09 NOTE — Anesthesia Procedure Notes (Signed)
 Procedure Name: LMA Insertion Date/Time: 04/09/2023 9:37 AM  Performed by: Pam Macario BROCKS, CRNAPre-anesthesia Checklist: Patient identified, Emergency Drugs available, Suction available, Patient being monitored and Timeout performed Patient Re-evaluated:Patient Re-evaluated prior to induction Oxygen Delivery Method: Circle system utilized Preoxygenation: Pre-oxygenation with 100% oxygen Induction Type: IV induction Ventilation: Mask ventilation without difficulty LMA: LMA inserted LMA Size: 4.0 Number of attempts: 1 Airway Equipment and Method: Bite block Placement Confirmation: positive ETCO2, breath sounds checked- equal and bilateral and CO2 detector Tube secured with: Tape Dental Injury: Teeth and Oropharynx as per pre-operative assessment

## 2023-04-09 NOTE — Op Note (Signed)
 04/09/2023  10:06 AM  PATIENT:  Tonya Weeks  33 y.o. female  PRE-OPERATIVE DIAGNOSIS:  atypical sqamous cells +HPV  POST-OPERATIVE DIAGNOSIS:  atypical sqamous cells +HPV  PROCEDURE:  Procedure(s): COLPOSCOPY (N/A)  SURGEON:  Surgeons and Role:    DEWAINE Just, Reality Dejonge, DO - Primary  ANESTHESIA:   local and general  EBL:  5 mL   LOCAL MEDICATIONS USED:  LIDOCAINE   and Amount: 10 ml  SPECIMEN:  Source of Specimen:  cervical biopsies and endocervical curettage  DISPOSITION OF SPECIMEN:  PATHOLOGY  COUNTS:  YES  PLAN OF CARE: Discharge to home after PACU  PATIENT DISPOSITION:  PACU - hemodynamically stable.  FINDINGS: normal vulva, vagina, cervix, minimal acetowhite changes at TZ not extending.  DESCRIPTION OF PROCEDURE:  Patient was taken to the operating room where anesthesia was administered and found to be adequate.  She was prepped with betadine  in dorsal lithotomy position.  A speculum was placed and with good visualization of the cervix, a single tooth tenaculum was used to grasp the anterior lip of the cervix.  10cc 1% lidocaine  paracervical block was given.  Acetic acid  was used to bathe the cervix with fox swabs.  The colposcope was used to visualize the cervix under high power.  Possible minimal AW changes noted anteriorly at TZ just at entrance of cervical canal.  Biopsies obtain at 12 oc and 1 oc followed by an ECC.  Monsel's solution was used to control minimal bleeding.  The tenaculum was removed and hemostasis noted.  Speculum was removed.  Specimens were sent to pathology.  Patient tolerated the procedure well and was taken to recovery in stable condition. Delay start of Pharmacological VTE agent (>24hrs) due to surgical blood loss or risk of bleeding: not applicable

## 2023-04-09 NOTE — Discharge Instructions (Addendum)
  Post Anesthesia Home Care Instructions  Activity: Get plenty of rest for the remainder of the day. A responsible individual must stay with you for 24 hours following the procedure.  For the next 24 hours, DO NOT: -Drive a car -Advertising copywriter -Drink alcoholic beverages -Take any medication unless instructed by your physician -Make any legal decisions or sign important papers.  Meals: Start with liquid foods such as gelatin or soup. Progress to regular foods as tolerated. Avoid greasy, spicy, heavy foods. If nausea and/or vomiting occur, drink only clear liquids until the nausea and/or vomiting subsides. Call your physician if vomiting continues.  Special Instructions/Symptoms: Your throat may feel dry or sore from the anesthesia or the breathing tube placed in your throat during surgery. If this causes discomfort, gargle with warm salt water. The discomfort should disappear within 24 hours.  If you had a scopolamine  patch placed behind your ear for the management of post- operative nausea and/or vomiting:  1. The medication in the patch is effective for 72 hours, after which it should be removed.  Wrap patch in a tissue and discard in the trash. Wash hands thoroughly with soap and water. 2. You may remove the patch earlier than 72 hours if you experience unpleasant side effects which may include dry mouth, dizziness or visual disturbances. 3. Avoid touching the patch. Wash your hands with soap and water after contact with the patch.    Remove patch behind left ear by Sunday, April 10, 2022.  Meals: Start with liquid foods such as gelatin or soup. Progress to regular foods as tolerated. Avoid greasy, spicy, heavy foods. If nausea and/or vomiting occur, drink only clear liquids until the nausea and/or vomiting subsides. Call your physician if vomiting continues.  Special Instructions/Symptoms: Your throat may feel dry or sore from the anesthesia or the breathing tube placed in your  throat during surgery. If this causes discomfort, gargle with warm salt water. The discomfort should disappear within 24 hours.  Call your surgeon if you experience:   1.  Fever over 101.0. 2.  Inability to urinate. 3.  Nausea and/or vomiting. 4.  Extreme swelling or bruising at the surgical site. 5.  Continued bleeding from the incision. 6.  Increased pain, redness or drainage from the incision. 7.  Problems related to your pain medication. 8.  Any problems and/or concerns. 9. Nothing in your vagina until your follow-up appointment, no tampons, douching or intercourse. 10. May shower tomorrow. 11. Pads only for vaginal drainage.

## 2023-04-09 NOTE — Transfer of Care (Signed)
 Immediate Anesthesia Transfer of Care Note  Patient: Tonya Weeks  Procedure(s) Performed: Procedure(s) (LRB): COLPOSCOPY (N/A)  Patient Location: PACU  Anesthesia Type: GA  Level of Consciousness: awake, sedated, patient cooperative and responds to stimulation, c/o pain in back - comfort measures given w/ medication   Airway & Oxygen Therapy: Patient Spontanous Breathing and Patient connected to [place] oxygen  Post-op Assessment: Report given to PACU RN, Post -op Vital signs reviewed and stable and Patient moving all extremities  Post vital signs: Reviewed and stable  Complications: No apparent anesthesia complications

## 2023-04-10 ENCOUNTER — Encounter (HOSPITAL_BASED_OUTPATIENT_CLINIC_OR_DEPARTMENT_OTHER): Payer: Self-pay | Admitting: Obstetrics and Gynecology

## 2023-04-12 LAB — SURGICAL PATHOLOGY

## 2023-04-21 DIAGNOSIS — Z113 Encounter for screening for infections with a predominantly sexual mode of transmission: Secondary | ICD-10-CM | POA: Diagnosis not present

## 2023-05-27 DIAGNOSIS — B20 Human immunodeficiency virus [HIV] disease: Principal | ICD-10-CM

## 2023-05-27 DIAGNOSIS — B009 Herpesviral infection, unspecified: Principal | ICD-10-CM

## 2023-05-27 MED ORDER — BICTEGRAVIR 50 MG-EMTRICITABINE 200 MG-TENOFOVIR ALAFENAM 25 MG TABLET
ORAL_TABLET | Freq: Every day | ORAL | 1 refills | 90.00 days | Status: CP
Start: 2023-05-27 — End: 2024-05-26
  Filled 2023-06-17: qty 90, 90d supply, fill #0

## 2023-05-28 NOTE — Unmapped (Signed)
 [facility] Specialty Medication Onboarding    Specialty Medication: BIKTARVY 50-200-25 mg tablet (bictegrav-emtricit-tenofov ala)  Prior Authorization: Not Required   Financial Assistance: No - copay  <$25  Final Copay/Day Supply: $0 / 90    Insurance Restrictions: None     Notes to Pharmacist:   Credit Card on File: no  Start Date on Rx:      The triage team has completed the benefits investigation and has determined that the patient is able to fill this medication at [facility]. Please contact the patient to complete the onboarding or follow up with the prescribing physician as needed.

## 2023-05-28 NOTE — Unmapped (Signed)
 [facility] has been contacted in regards to their refill of Biktarvy. At this time, they have declined refill due to patient having 30 doses remaining. Refill assessment call date has been updated per the patient's request.

## 2023-05-31 DIAGNOSIS — I10 Essential (primary) hypertension: Secondary | ICD-10-CM | POA: Diagnosis not present

## 2023-06-03 NOTE — Unmapped (Signed)
 Infectious Diseases Clinic          HIV (dx'd 2013, nadir CD4 26/2% in 03/2021)  Medication history:  -03/21/2015 genotype no resistance (integrase resistance not included)  -01/29/2017 genotype - per provider notes E138A (R-rilpivirine)  -Previous ARVs: TDF/FTC + atazanavir/ritonavir (~2014)  -TAF/FTC + darunavir/cobicistat (Prescobix) (2017)  -TDF/FTC + dolutegravir  -TAF/FTC + dolutegravir most recently, per patient into late 2021 when former providers continued to prescribe (Although at this time she was last seen in [facility] ID clinic 06/11/2017)     -Multiple months off medications at different junctures, major barrier to care has been privacy (bad experiences w/ indiscrete healthcare systems previously) and not wanting to call to make appointments. Patient was reengaged in care via [facility] Counselor in early 2023.    06/04/2023  Overall things are going well. 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 Discussed secondary prophylaxis, prefers to continue episodic treatment.   To be given prescription for valacyclovir 1g BID for 7-10d during outbreaks     H/o Syphilis and serofast, 01/2012   02/09/2012 RPR 1:64 (CareEverywhere), s/p pen G x3 (11/26, 12/3, 03/15/2012)  - 2016 & 2018 RPR 1:4  - 03/2021, 02/2022, 01/22/23: RPR 1:4  - 06/04/2023: [name] showing RPR of 1:2 recently. We will not treat as she is serofast and her previous titers have been higher.      H/o chlamydia, 01/2013  --> Tx with Azithromycin     Pelvic pain, 08/2020 --> extensive work-up done  Interstitial cystitis  - Since last visit in 02/2022:  Seen at [facility] for her pelvic pain --> diagnosed with interstitial cystitis. Patient has a history of recurrent UTIs, small capacity bladder (<200 mL) with severe ulcerations on cystoscopy, and chronic pelvic pain.   Her current medication regimen includes Butrans 10 mcg/h patch and Gabapentin 800 mg 3 times daily, Robaxin (day), Tizanidine (night) and Lidocaine ointment as prescribed outside providers. Last 05/2022: Bladder instillation of Bupivacaine and 300mg  of Elmiron.   - 01/22/23: Patient explained that the plan is not to continue with Urology for bladder instillations any longer as her pain is well controlled.  - 06/04/2023: Still with good pain mgmt.     Hypertension, chronically uncontrolled  - 01/22/23: in clinic 154/113; Diastolic pressure usually >100. Patient is asymptomatic but expressed concern that these elevated pressures could affect her in the future.   - 06/04/23: BP in clinic markedly elevated to 160/119 - currently asymptomatic and states that she took her blood pressure medications currently on Amlodipine 5mg  orally daily and has been advised to increase to 10mg  orally daily.         06/03/2023: Summary of diagnoses and orders  for today's visit:   Diagnosis ICD-10-CM Associated Orders   1. HIV disease (CMS-HCC)  B20 HIV RNA, Quantitative, PCR      2. On highly active antiretroviral therapy (HAART)  Z79.899 HIV RNA, Quantitative, PCR     ALT     AST     Bilirubin, total     CBC w/ Differential     Basic Metabolic Panel      3. Therapeutic drug monitoring  Z51.81 HIV RNA, Quantitative, PCR      4. At risk for adverse drug reaction  Z91.89 ALT     AST     Bilirubin, total     CBC w/ Differential     Basic Metabolic Panel      5. Screen for sexually transmitted diseases  Z11.3           Disposition  Return in about 6 months (around 12/05/2023) for In-person.    To do @ next RTC  Follow-up labs    I personally spent 18 minutes face-to-face and non-face-to-face in the care of this patient, which includes all pre, intra, and post visit time on the date of service.  All documented time was specific to the E/M visit and does not include any procedures that may have been performed.      Brandy Evans, MBBS                Chief Complaint   Establish care for HIV    HPI  In addition to details in A&P above  Brandy Evans is a 33 y.o. female  with HIV here to follow-up for routine care. Recently restarted ART (TAF/FTC/BIC) 03/2021.   Diagnosed 2013 on routine pregnancy screening, previously followed by [facility]  History as stated above in A&P.      ROS: Denies cough/cold, fevers, chills.  Denies chest pain, palpitations, dyspnea  GU: Pelvic pain has improved on her current regimen  GI: Denies abdominal pain, nausea, vomiting, diarrhea.  Skin:  No new rashes.     Concerning her HTN - denies headache, blurry vision, weakness on any particular side of the body.             BP 160/119 (BP Site: L Arm, BP Position: Sitting, BP Cuff Size: Large) Comment: upset today - Temp 36.7 ??C (98.1 ??F)  - Resp 18  - Wt 90.2 kg (198 lb 12.8 oz)  - BMI 35.22 kg/m??    Wt Readings from Last 3 Encounters:   06/04/23 90.2 kg (198 lb 12.8 oz)   01/22/23 93.9 kg (207 lb)   03/16/22 81.3 kg (179 lb 4.8 oz)       Physical Exam:    Constitutional: No acute distress  Eyes: Lids and lashes normal.  Anicteric sclera.  ENT: Oropharynx without any erythema or exudate.  Moist mucous membranes  Cardiovascular: Regular, rate and rhythm without murmurs rubs or gallops.  Palpable distal pulses.  No edema  Pulmonary: Clear to auscultation bilaterally without wheezes/crackles/rhonchi.  Normal work of breathing.  Symmetric chest wall movement.  Abdomen/GI: Soft, nontender, nondistended  Skin/Integument: Warm and dry without rash - some tattoos.  Musculoskeletal: No joint tenderness.  Full range of motion.  Neurologic: Grossly nonfocal.  Alert and oriented to person, place and time.  Cranial nerves grossly intact.  Moves all extremities.  Psychiatry: Appropriate       Health maintenance      Oral health   The patient does or does not have  a dentist. Last dental exam last year.    Eye health  The patient does or does not use corrective lenses. Last eye exam planning to see her own doctor.    Metabolic conditions  Wt Readings from Last 5 Encounters:   06/04/23 90.2 kg (198 lb 12.8 oz)   01/22/23 93.9 kg (207 lb)   03/16/22 81.3 kg (179 lb 4.8 oz)   08/27/21 77.6 kg (171 lb)   06/11/21 (P) 64.9 kg (143 lb)     Lab Results   Component Value Date    CREATININE 0.73 01/22/2023    PROTEINUA 70 mg/dL (A) 16/12/9602    GLUCOSEU Trace (A) 09/24/2020    GLU 92 01/22/2023    A1C 5.7 (H) 03/16/2022    ALT 22 01/22/2023    ALT <7 (L) 03/16/2022    ALT <7 (L) 06/11/2021     # Kidney health - SCr and eGFR  # Bone health - assessment not yet needed (under age 2); Marland Kitchen   # Diabetes assessment - check A1c  # NAFLD assessment - suspicion for MAFLD low    Communicable diseases  Lab Results   Component Value Date    QFTTBGOLD Negative 03/16/2022    HEPAIGG Reactive (A) 03/16/2022    HEPBSAB Nonreactive 03/16/2022    HEPCAB Nonreactive 03/16/2022     # TB screening - no longer needed; negative IGRA, low risk  # Hepatitis screening - does not meet screening criteria; no further screening indicated  # MMR screening - assessment(s) needed but deferred to future visit    Lab Results   Component Value Date    RPR Reactive (A) 01/22/2023    RPR 1:4 01/22/2023    CTNAA Negative 04/23/2021    GCNAA Negative 04/23/2021    SPECSOURCE Urine 04/23/2021     GC/CT NAATs - patient declined screening  RPR - patient declined screening     Cancer screening  No results found for: PSASCRN, PSA, PAP, FINALDX  # Cervical - assessment(s) needed but deferred to future visit  # Breast - no indication for screening  # Liver - no screening indicated  # Lung - screening not indicated    Cardiovascular disease  Lab Results   Component Value Date    CHOL 118 03/16/2022    HDL 37 (L) 03/16/2022    LDL 45 03/16/2022    NONHDL 81 03/16/2022    TRIG 540 (H) 03/16/2022     # The ASCVD Risk score (Arnett DK, et al., 2019) failed to calculate.  - is not taking aspirin   - is not taking statin  - BP control poor  - never smoker  # AAA screening - no indication for screening    Immunization History   Administered Date(s) Administered    COVID-19 VAC,BIVALENT(44YR UP),PFIZER 06/11/2021    COVID-19 VACC,MRNA,(PFIZER)(PF) 07/07/2019, 08/01/2019    DTP 01/05/1991, 06/02/1994    HPV Quadrivalent (Gardasil) 04/15/2015    Haemophilis Influenza Type B Vaccine Hboc 09/16/1990    Hepatitis B Vaccine, Unspecified Formulation 08/03/2001, 09/14/2001, 06/26/2002    HiB, unspecified 08/05/1989, 10/07/1989, 12/03/1989, 01/05/1991    Human Pappilomavirus Vaccine,9-Valent(PF) 03/16/2022    INFLUENZA TIV (TRI) 92MO+ W/ PRESERV (IM) 01/12/2012    INFLUENZA VACCINE IIV3(IM)(PF)6 MOS UP 01/22/2023    Influenza Vaccine Quad(IM)6 MO-Adult(PF) 02/13/2013, 02/11/2017    Influenza Virus Vaccine, unspecified formulation 11/28/2014    MMR 09/16/1990, 06/02/1994    OPV 06/02/1994    PNEUMOCOCCAL POLYSACCHARIDE 23-VALENT 09/01/2011    Pneumococcal  Conjugate 20-valent 08/27/2021    Polio Virus Vaccine, Unspecified Formulation 08/12/1989, 10/07/1989, 12/03/1989, 11/15/1990    Td (adult) unspecified formulation 08/03/2001    TdaP 04/16/2017       Immunizations today -  deferred for next visit    Past Medical History:   Diagnosis Date    Chronic interstitial cystitis     Genital herpes simplex type 1 infection     History of chlamydia 01/2013    History of COVID-19 2022    History of syphilis 01/2012    - 02/09/2012 RPR 1:64 (CareEverywhere), s/p pen G x3 (11/26, 12/3, 03/15/2012) HIV (human immunodeficiency virus infection) (CMS-HCC) 2013    Diagnosed 2013 on routine pregnancy screening, previously followed by [facility] ID    Sickle cell trait (CMS-HCC)          Social History  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     Medications and Allergies  She has a current medication list which includes the following prescription(s): bictegrav-emtricit-tenofov ala, butrans, calcium carbonate, gabapentin, hydrocortisone, hydroxyzine, oxybutynin, pentosan polysulfate, and tizanidine.    Allergies: Patient has no known allergies.    Family History  Her family history includes Arthritis in her maternal grandmother; Asthma in her maternal grandmother; Diabetes in her maternal grandmother; Heart attack in her mother; Hypertension in her father, maternal grandmother, and mother; Lupus in her mother; Scleroderma in her mother; Stroke in her maternal grandmother.

## 2023-06-04 ENCOUNTER — Ambulatory Visit
Admit: 2023-06-04 | Discharge: 2023-06-05 | Payer: BLUE CROSS/BLUE SHIELD | Attending: Student in an Organized Health Care Education/Training Program | Primary: Student in an Organized Health Care Education/Training Program

## 2023-06-04 DIAGNOSIS — B20 Human immunodeficiency virus [HIV] disease: Secondary | ICD-10-CM | POA: Diagnosis not present

## 2023-06-04 DIAGNOSIS — Z5181 Encounter for therapeutic drug level monitoring: Secondary | ICD-10-CM | POA: Diagnosis not present

## 2023-06-04 DIAGNOSIS — Z113 Encounter for screening for infections with a predominantly sexual mode of transmission: Secondary | ICD-10-CM | POA: Diagnosis not present

## 2023-06-04 DIAGNOSIS — Z79899 Other long term (current) drug therapy: Secondary | ICD-10-CM | POA: Diagnosis not present

## 2023-06-04 DIAGNOSIS — Z9189 Other specified personal risk factors, not elsewhere classified: Secondary | ICD-10-CM | POA: Diagnosis not present

## 2023-06-04 LAB — CBC W/ AUTO DIFF
BASOPHILS ABSOLUTE COUNT: 0 10*9/L (ref 0.0–0.1)
BASOPHILS RELATIVE PERCENT: 0.7 %
EOSINOPHILS ABSOLUTE COUNT: 0 10*9/L (ref 0.0–0.5)
EOSINOPHILS RELATIVE PERCENT: 0.6 %
HEMATOCRIT: 34.9 % (ref 34.0–44.0)
HEMOGLOBIN: 11.5 g/dL (ref 11.3–14.9)
LYMPHOCYTES ABSOLUTE COUNT: 1.8 10*9/L (ref 1.1–3.6)
LYMPHOCYTES RELATIVE PERCENT: 29.9 %
MEAN CORPUSCULAR HEMOGLOBIN CONC: 33 g/dL (ref 32.0–36.0)
MEAN CORPUSCULAR HEMOGLOBIN: 24.7 pg — ABNORMAL LOW (ref 25.9–32.4)
MEAN CORPUSCULAR VOLUME: 74.8 fL — ABNORMAL LOW (ref 77.6–95.7)
MEAN PLATELET VOLUME: 8.9 fL (ref 6.8–10.7)
MONOCYTES ABSOLUTE COUNT: 0.5 10*9/L (ref 0.3–0.8)
MONOCYTES RELATIVE PERCENT: 8.4 %
NEUTROPHILS ABSOLUTE COUNT: 3.6 10*9/L (ref 1.8–7.8)
NEUTROPHILS RELATIVE PERCENT: 60.4 %
PLATELET COUNT: 277 10*9/L (ref 150–450)
RED BLOOD CELL COUNT: 4.67 10*12/L (ref 3.95–5.13)
RED CELL DISTRIBUTION WIDTH: 18.3 % — ABNORMAL HIGH (ref 12.2–15.2)
WBC ADJUSTED: 5.9 10*9/L (ref 3.6–11.2)

## 2023-06-04 LAB — BASIC METABOLIC PANEL
ANION GAP: 11 mmol/L (ref 5–14)
BLOOD UREA NITROGEN: 8 mg/dL — ABNORMAL LOW (ref 9–23)
BUN / CREAT RATIO: 11
CALCIUM: 10.1 mg/dL (ref 8.7–10.4)
CHLORIDE: 105 mmol/L (ref 98–107)
CO2: 24.8 mmol/L (ref 20.0–31.0)
CREATININE: 0.76 mg/dL (ref 0.55–1.02)
EGFR CKD-EPI (2021) FEMALE: >90 mL/min/{1.73_m2} (ref >=60)
GLUCOSE RANDOM: 84 mg/dL (ref 70–179)
POTASSIUM: 3.7 mmol/L (ref 3.4–4.8)
SODIUM: 141 mmol/L (ref 135–145)

## 2023-06-04 LAB — BILIRUBIN, TOTAL: BILIRUBIN TOTAL: 0.4 mg/dL (ref 0.3–1.2)

## 2023-06-04 LAB — HIV RNA, QUANTITATIVE, PCR: HIV RNA QNT RSLT: NOT DETECTED

## 2023-06-04 LAB — AST: AST (SGOT): 14 U/L (ref <=34)

## 2023-06-04 LAB — ALT: ALT (SGPT): 7 U/L — ABNORMAL LOW (ref 10–49)

## 2023-06-04 NOTE — Unmapped (Signed)
 Pt has no concerns at this time.

## 2023-06-04 NOTE — Unmapped (Signed)
 It was great to see you today.    The ID clinic phone number is [phone].  The ID clinic fax number is [phone].    Please note that your laboratory and other results may be visible to you in real time, possibly before they reach your provider. Please allow 48 hours for clinical interpretation of these results. Importantly, even if a result is flagged as abnormal, it may not be one that impacts your health.    For urgent issues on nights and weekends you may reach the ID Physician on call through the [facility] Operator at [phone].     URGENT CARE  Please call ahead to speak with the nursing staff if you are in need of an urgent appointment.       MEDICATIONS  For refills please contact your pharmacy and ask them to electronically send or fax the request to the clinic.   Please bring all medications in original bottles to every appointment.    [facility] (formerly [facility]) or [employer] Eligibility (required even if you do not receive medication through [facility])  Please remember to renew your [facility] eligibility during renewal periods which occur twice a year: January-March and July-September.     The following are needed for each renewal:   - [facility] Identification (if you don't have one, then a bill with your name and address in [place])   - proof of income (award letter, W-2, or last three check stubs)   If you are unable to come in for renewal, let us know if we can mail, fax or e-mail paperwork to you.   [facility] Contact: [phone].     Lab info:  Your most recent CD4 T-cell counts and viral loads are below. Here are a few things to keep in mind when looking at your numbers:  Our goal is to get your virus to be undetectable and keep it undetectable. If the virus is undetectable you are much more likely to stay healthy.  We consider your viral load to be undetectable if it says <40 or if it says Not detected.  For most people, we're checking CD4 counts every other visit (once or twice a year, or sometimes even less).  It's normal for your CD4 count to be different from visit to visit.   You can help by taking your medications at about the same time, every single day. If you're having trouble with taking your medications, it's important to let us know.    Lab Results   Component Value Date    ACD4 527 01/22/2023    CD4 31 (L) 01/22/2023    HIVCP <20 (H) 06/11/2021    HIVRS Not Detected 01/22/2023       Neita Garnet, MBBS     Infectious Diseases at [facility] Phone: [phone]

## 2023-06-07 NOTE — Unmapped (Signed)
 I saw and evaluated the patient, participating in the key portions of the service.  I reviewed the resident???s note.  I agree with the resident???s findings and plan. Christella Hartigan, MD

## 2023-06-11 NOTE — Unmapped (Signed)
 [facility]    Patient Onboarding/Medication Counseling    Brandy Evans is a 33 y.o. female with HIV who I am counseling today on continuation of therapy.  I am speaking to the patient.    Was a Nurse, learning disability used for this call? No    Verified patient's date of birth / HIPAA.    Specialty medication(s) to be sent: Infectious Disease: Biktarvy      Non-specialty medications/supplies to be sent: n/a      Medications not needed at this time: n/a         Biktarvy (bictegravir, emtricitabine, and tenofovir alafenamide)    Medication & Administration     Dosage: Take 1 tablet by mouth daily    Administration: Take without regard to food    Adherence/Missed dose instructions: take missed dose as soon as you remember. If it is close to the time of your next dose, skip the dose and resume with your next scheduled dose.    Goals of Therapy     To suppress viral replication and keep patient's HIV undetectable by lab tests    Side Effects & Monitoring Parameters     Common Side Effects:  Diarrhea  Upset stomach  Headache  Changes in Weight  Changes in mood    The following side effects should be reported to the provider:     If patient experiences: signs of an allergic reaction (rash; hives; itching; red, swollen, blistered, or peeling skin with or without fever; wheezing; tightness in the chest or throat; trouble breathing, swallowing, or talking; unusual hoarseness; or swelling of the mouth, face, lips, tongue, or throat)  signs of kidney problems (unable to pass urine, change in how much urine is passed, blood in the urine, or a big weight gain)  signs of liver problems (dark urine, feeling tired, not hungry, upset stomach or stomach pain, light-colored stools, throwing up, or yellow skin or eyes)  signs of lactic acidosis (fast breathing, fast heartbeat, a heartbeat that does not feel normal, very bad upset stomach or throwing up, feeling very sleepy, shortness of breath, feeling very tired or weak, very bad dizziness, feeling cold, or muscle pain or cramps)  Weight gain: some patients have reported weight gain after starting this medication. The amount of weight can vary.    Monitoring Parameters:  CD4  Count  HIV RNA plasma levels,  Liver function  Total bilirubin  serum creatinine  urine glucose  urine protein (prior to or when initiating therapy and as clinically indicated during therapy);       Drug/Food Interactions     Medication list reviewed in Epic. The patient was instructed to inform the care team before taking any new medications or supplements.  See below .   Calcium Salts: May decrease the serum concentration of Biktarvy. If taken with food, Biktarvy can be administered with calcium salts.   Iron Preparations: May decrease the serum concentration of Biktarvy. If taken with food, Biktarvy can be administered with Ferrous sulfate. If taken on an empty stomach, Biktarvy must be taken 2 hours before ferrous sulfate. Avoid other iron salts.    Contraindications, Warnings, & Precautions     Black Box Warning: Severe acute exacertbations of HBV have been reported in patients coinfected with HIV-1 and HBV fllowing discontinuation of therapy  Coadministration with dofetilide, rifampin is contraindicated  Immune reconstitution syndrome: Patients may develop immune reconstitution syndrome, resulting in the occurrence of an inflammatory response to an indolent or residual  opportunistic infection or activation of autoimmune disorders (eg, Graves disease, polymyositis, Guillain-Barr?? syndrome, autoimmune hepatitis)   Lactic acidosis/hepatomegaly  Renal toxicity: patients with preexisting renal impairment and those taking nephrotoxic agents (including NSAIDs) are at increased risk.     Storage, Handling Precautions, & Disposal     Store in the original container at room temperature.   Keep lid tightly closed.   Store in a dry place. Do not store in a bathroom.   Keep all drugs in a safe place. Keep all drugs out of the reach of children and pets.   Throw away unused or expired drugs. Do not flush down a toilet or pour down a drain unless you are told to do so. Check with your pharmacist if you have questions about the best way to throw out drugs. There may be drug take-back programs in your area.      Current Medications (including OTC/herbals), Comorbidities and Allergies     Current Outpatient Medications   Medication Sig Dispense Refill    bictegrav-emtricit-tenofov ala (BIKTARVY) 50-200-25 mg tablet Take 1 tablet by mouth daily. 90 tablet 1    BUTRANS 10 mcg/hour PTWK transdermal patch PLACE 1 PATCH ONTO THE SKIN EVERY 7 DAYS.      calcium carbonate (TUMS ORAL) Take by mouth.      gabapentin (NEURONTIN) 800 MG tablet Take 1 tablet (800 mg total) by mouth Three (3) times a day.      hydrocortisone 2.5 % ointment APPLY 1 APPLICATION TWICE A DAY      hydrOXYzine (ATARAX) 25 MG tablet Take 1 tablet (25 mg total) by mouth Three (3) times a day.      oxybutynin (DITROPAN) 5 MG tablet Take 1 tablet (5 mg total) by mouth Three (3) times a day as needed.      pentosan polysulfate (ELMIRON) 100 mg capsule Take 1 capsule (100 mg total) by mouth Three (3) times a day before meals.      tiZANidine (ZANAFLEX) 4 MG tablet Take 1 tablet (4 mg total) by mouth every twelve (12) hours as needed.       No current facility-administered medications for this visit.       No Known Allergies    Patient Active Problem List   Diagnosis    Pelvic pain    Cystitis    Depression    High-tone pelvic floor dysfunction    History of recurrent UTIs    HIV (human immunodeficiency virus infection) (CMS-HCC)    HSV-1 (herpes simplex virus 1) infection    IC (interstitial cystitis)    Sore throat       Medication list has been reviewed and updated in Epic: Yes    Allergies have been reviewed and updated in Epic: Yes    Appropriateness of Therapy     Acute infections noted within Epic:  No active infections  Patient reported infection: None    Is the medication and dose appropriate based on diagnosis, medication list, comorbidities, allergies, medical history, patient???s ability to self-administer the medication, and therapeutic goals? Yes    Prescription has been clinically reviewed: Yes      Baseline Quality of Life Assessment      How many days over the past month did your HIV  keep you from your normal activities? For example, brushing your teeth or getting up in the morning. Patient declined to answer    Financial Information     Medication Assistance provided: None Required    Anticipated  copay of $0/90 days reviewed with patient. Verified delivery address.    Delivery Information     Scheduled delivery date: 06/18/23    Expected start date: has ~ 2 weeks left from previous fill      Medication will be delivered via UPS to the prescription address in [facility].  This shipment will not require a signature.      Explained the services we provide at [facility] and that each month we would call to set up refills.  Stressed importance of returning phone calls so that we could ensure they receive their medications in time each month.  Informed patient that we should be setting up refills 7-10 days prior to when they will run out of medication.  A pharmacist will reach out to perform a clinical assessment periodically.  Informed patient that a welcome packet, containing information about our pharmacy and other support services, a Notice of Privacy Practices, and a drug information handout will be sent.      The patient or caregiver noted above participated in the development of this care plan and knows that they can request review of or adjustments to the care plan at any time.      Patient or caregiver verbalized understanding of the above information as well as how to contact the pharmacy at [phone] option 4 with any questions/concerns.  The pharmacy is open Monday through Friday 8:30am-4:30pm.  A pharmacist is available 24/7 via pager to answer any clinical questions they may have.    Patient Specific Needs     Does the patient have any physical, cognitive, or cultural barriers? No    Does the patient have adequate living arrangements? (i.e. the ability to store and take their medication appropriately) Yes    Did you identify any home environmental safety or security hazards? No    Patient prefers to have medications discussed with  Patient     Is the patient or caregiver able to read and understand education materials at a high school level or above? Yes    Patient's primary language is  English     Is the patient high risk? No    Does the patient have an additional or emergency contact listed in their chart? Yes    SOCIAL DETERMINANTS OF HEALTH     At the [facility], we have learned that life circumstances - like trouble affording food, housing, utilities, or transportation can affect the health of many of our patients.   That is why we wanted to ask: are you currently experiencing any life circumstances that are negatively impacting your health and/or quality of life? Patient declined to answer    Social Drivers of Health     Food Insecurity: Not on file   Tobacco Use: Low Risk  (04/09/2023)    Received from [facility]    Patient History     Smoking Tobacco Use: Never     Smokeless Tobacco Use: Never     Passive Exposure: Not on file   Transportation Needs: Not on file   Alcohol Use: Not on file   Housing: Not on file   Physical Activity: Not on file   Utilities: Not on file   Stress: Not on file   Interpersonal Safety: Not on file   Substance Use: Not on file (02/08/2023)   Intimate Partner Violence: Not on file   Social Connections: Not on file   Financial Resource Strain: Not on file  Depression: Not at risk (10/29/2022)    Received from [facility]    PHQ-2     Patient Health Questionnaire-2 Score: 0   Internet Connectivity: Not on file   Health Literacy: Not on file       Would you be willing to receive help with any of the needs that you have identified today? Not applicable       Darryl Nestle, PharmD  [facility] Specialty Pharmacist

## 2023-06-17 DIAGNOSIS — N760 Acute vaginitis: Secondary | ICD-10-CM | POA: Diagnosis not present

## 2023-06-27 ENCOUNTER — Other Ambulatory Visit: Payer: Self-pay

## 2023-06-27 ENCOUNTER — Emergency Department (HOSPITAL_COMMUNITY)

## 2023-06-27 ENCOUNTER — Emergency Department (HOSPITAL_COMMUNITY)
Admission: EM | Admit: 2023-06-27 | Discharge: 2023-06-27 | Disposition: A | Discharge status: Home / Self Care | Attending: Emergency Medicine | Admitting: Emergency Medicine

## 2023-06-27 DIAGNOSIS — Z0389 Encounter for observation for other suspected diseases and conditions ruled out: Secondary | ICD-10-CM | POA: Diagnosis not present

## 2023-06-27 DIAGNOSIS — I10 Essential (primary) hypertension: Secondary | ICD-10-CM | POA: Insufficient documentation

## 2023-06-27 DIAGNOSIS — Z79899 Other long term (current) drug therapy: Secondary | ICD-10-CM | POA: Insufficient documentation

## 2023-06-27 DIAGNOSIS — J101 Influenza due to other identified influenza virus with other respiratory manifestations: Secondary | ICD-10-CM | POA: Diagnosis not present

## 2023-06-27 DIAGNOSIS — R059 Cough, unspecified: Secondary | ICD-10-CM | POA: Diagnosis present

## 2023-06-27 DIAGNOSIS — R0789 Other chest pain: Secondary | ICD-10-CM | POA: Diagnosis not present

## 2023-06-27 LAB — CBC WITH DIFFERENTIAL/PLATELET
Abs Immature Granulocytes: 0.02 10*3/uL (ref 0.00–0.07)
Basophils Absolute: 0 10*3/uL (ref 0.0–0.1)
Basophils Relative: 0 %
Eosinophils Absolute: 0 10*3/uL (ref 0.0–0.5)
Eosinophils Relative: 0 %
HCT: 36.1 % (ref 36.0–46.0)
Hemoglobin: 11.7 g/dL — ABNORMAL LOW (ref 12.0–15.0)
Immature Granulocytes: 0 %
Lymphocytes Relative: 11 %
Lymphs Abs: 0.6 10*3/uL — ABNORMAL LOW (ref 0.7–4.0)
MCH: 24.8 pg — ABNORMAL LOW (ref 26.0–34.0)
MCHC: 32.4 g/dL (ref 30.0–36.0)
MCV: 76.6 fL — ABNORMAL LOW (ref 80.0–100.0)
Monocytes Absolute: 0.4 10*3/uL (ref 0.1–1.0)
Monocytes Relative: 8 %
Neutro Abs: 4.4 10*3/uL (ref 1.7–7.7)
Neutrophils Relative %: 81 %
Platelets: 221 10*3/uL (ref 150–400)
RBC: 4.71 MIL/uL (ref 3.87–5.11)
RDW: 16.9 % — ABNORMAL HIGH (ref 11.5–15.5)
WBC: 5.5 10*3/uL (ref 4.0–10.5)
nRBC: 0 % (ref 0.0–0.2)

## 2023-06-27 LAB — COMPREHENSIVE METABOLIC PANEL WITH GFR
ALT: 48 U/L — ABNORMAL HIGH (ref 0–44)
AST: 48 U/L — ABNORMAL HIGH (ref 15–41)
Albumin: 4.3 g/dL (ref 3.5–5.0)
Alkaline Phosphatase: 78 U/L (ref 38–126)
Anion gap: 9 (ref 5–15)
BUN: 8 mg/dL (ref 6–20)
CO2: 20 mmol/L — ABNORMAL LOW (ref 22–32)
Calcium: 9.3 mg/dL (ref 8.9–10.3)
Chloride: 107 mmol/L (ref 98–111)
Creatinine, Ser: 0.88 mg/dL (ref 0.44–1.00)
GFR, Estimated: >60 mL/min (ref >60)
Glucose, Bld: 119 mg/dL — ABNORMAL HIGH (ref 70–99)
Potassium: 3.5 mmol/L (ref 3.5–5.1)
Sodium: 136 mmol/L (ref 135–145)
Total Bilirubin: <0.2 mg/dL (ref 0.0–1.2)
Total Protein: 7.6 g/dL (ref 6.5–8.1)

## 2023-06-27 LAB — APTT: aPTT: 35 s (ref 24–36)

## 2023-06-27 LAB — RESP PANEL BY RT-PCR (RSV, FLU A&B, COVID)  RVPGX2
Influenza A by PCR: POSITIVE — AB
Influenza B by PCR: NEGATIVE
Resp Syncytial Virus by PCR: NEGATIVE
SARS Coronavirus 2 by RT PCR: NEGATIVE

## 2023-06-27 LAB — PROTIME-INR
INR: 1.3 — ABNORMAL HIGH (ref 0.8–1.2)
Prothrombin Time: 16.3 s — ABNORMAL HIGH (ref 11.4–15.2)

## 2023-06-27 LAB — HCG, SERUM, QUALITATIVE: Preg, Serum: NEGATIVE

## 2023-06-27 LAB — I-STAT CG4 LACTIC ACID, ED: Lactic Acid, Venous: 0.6 mmol/L (ref 0.5–1.9)

## 2023-06-27 MED ORDER — ONDANSETRON 4 MG PO TBDP
4.0000 mg | ORAL_TABLET | Freq: Three times a day (TID) | ORAL | 0 refills | Status: AC | PRN
Start: 2023-06-27 — End: ?

## 2023-06-27 MED ORDER — SODIUM CHLORIDE 0.9 % IV SOLN
12.5000 mg | Freq: Once | INTRAVENOUS | Status: AC
  Administered 2023-06-27: 12.5 mg via INTRAVENOUS
  Filled 2023-06-27: qty 12.5

## 2023-06-27 MED ORDER — LACTATED RINGERS IV BOLUS (SEPSIS)
1000.0000 mL | Freq: Once | INTRAVENOUS | Status: AC
  Administered 2023-06-27: 1000 mL via INTRAVENOUS

## 2023-06-27 MED ORDER — KETOROLAC TROMETHAMINE 30 MG/ML IJ SOLN
30.0000 mg | Freq: Once | INTRAMUSCULAR | Status: AC
  Administered 2023-06-27: 30 mg via INTRAVENOUS
  Filled 2023-06-27: qty 1

## 2023-06-27 MED ORDER — ONDANSETRON HCL 4 MG/2ML IJ SOLN
4.0000 mg | Freq: Once | INTRAMUSCULAR | Status: AC
  Administered 2023-06-27: 4 mg via INTRAVENOUS
  Filled 2023-06-27: qty 2

## 2023-06-27 NOTE — ED Notes (Signed)
1 set of blood cultures sent

## 2023-06-27 NOTE — ED Triage Notes (Signed)
 Pt states that on Friday she began having malaise, fever (Tmax 102), cough. Yesterday she developed midsternal CP and SOB. Denies significant cardiac hx. Pt states that she did take a Percocet PTA

## 2023-06-27 NOTE — ED Provider Notes (Signed)
 [facility] AT [facility] Provider Note   CSN: [id number] Arrival date & time: 06/27/23  1745     History  Chief Complaint  Patient presents with   Chest Pain    Tonya Weeks is a 33 y.o. female with a history of HIV with full medical compliance, high blood pressure, chronic pain, presented to ED with 3 days of cough, congestion, malaise, headache, fevers, chills, muscle aches.  She reports that her son in the house "a cold" last week.  Reports compliance of all her medicines including HIV medications.  HPI     Home Medications Prior to Admission medications   Medication Sig Start Date End Date Taking? Authorizing Provider  ondansetron (ZOFRAN-ODT) 4 MG disintegrating tablet Take 1 tablet (4 mg total) by mouth every 8 (eight) hours as needed for up to 15 doses for nausea or vomiting. 06/27/23  Yes Chapin Arduini, Kermit Balo, MD  albuterol (VENTOLIN HFA) 108 (90 Base) MCG/ACT inhaler Inhale 1-2 puffs into the lungs every 6 (six) hours as needed for wheezing or shortness of breath. 02/23/23   Radford Pax, NP  amLODipine (NORVASC) 5 MG tablet Take 5 mg by mouth daily.    [provider]  APPLE CIDER VINEGAR PO Take by mouth. gummies    [provider]  Buprenorphine (BUTRANS TD) Place onto the skin. Every 7 days    [provider]  DESCOVY 200-25 MG tablet Take 1 tablet by mouth at bedtime. 06/14/17   [provider]  ELMIRON 100 MG capsule Take 100 mg by mouth 3 (three) times daily. 12/17/20   [provider]  hydrOXYzine (ATARAX/VISTARIL) 25 MG tablet Take 1 tablet (25 mg total) by mouth nightly. 1-2 one hour before sleep 12/05/20   [provider]  Multiple Vitamin (MULTIVITAMIN WITH MINERALS) TABS tablet Take 1 tablet by mouth daily.    [provider]  naproxen sodium (ALEVE) 220 MG tablet Take 440 mg by mouth daily as needed (pain).    [provider]  Tirzepatide-Weight Management  (ZEPBOUND [place]) Inject into the skin.    [provider]      Allergies    Patient has no known allergies.    Review of Systems   Review of Systems  Physical Exam Updated Vital Signs BP 133/85   Pulse (!) 116   Temp (!) 100.9 F (38.3 C) (Oral)   Resp (!) 22   SpO2 97%  Physical Exam Constitutional:      General: She is not in acute distress. HENT:     Head: Normocephalic and atraumatic.  Eyes:     Conjunctiva/sclera: Conjunctivae normal.     Pupils: Pupils are equal, round, and reactive to light.  Cardiovascular:     Rate and Rhythm: Regular rhythm. Tachycardia present.  Pulmonary:     Effort: Pulmonary effort is normal. No respiratory distress.  Abdominal:     General: There is no distension.     Tenderness: There is no abdominal tenderness.  Skin:    General: Skin is warm and dry.  Neurological:     General: No focal deficit present.     Mental Status: She is alert. Mental status is at baseline.  Psychiatric:        Mood and Affect: Mood normal.        Behavior: Behavior normal.     ED Results / Procedures / Treatments   Labs (all labs ordered are listed, but only abnormal results are displayed)  Labs Reviewed  RESP PANEL BY RT-PCR (RSV, FLU A&B, COVID)  RVPGX2 - Abnormal; Notable for the following components:      Result Value   Influenza A by PCR POSITIVE (*)    All other components within normal limits  COMPREHENSIVE METABOLIC PANEL WITH GFR - Abnormal; Notable for the following components:   CO2 20 (*)    Glucose, Bld 119 (*)    AST 48 (*)    ALT 48 (*)    All other components within normal limits  CBC WITH DIFFERENTIAL/PLATELET - Abnormal; Notable for the following components:   Hemoglobin 11.7 (*)    MCV 76.6 (*)    MCH 24.8 (*)    RDW 16.9 (*)    Lymphs Abs 0.6 (*)    All other components within normal limits  PROTIME-INR - Abnormal; Notable for the following components:   Prothrombin Time 16.3 (*)    INR 1.3 (*)    All other  components within normal limits  APTT  HCG, SERUM, QUALITATIVE  URINALYSIS, W/ REFLEX TO CULTURE (INFECTION SUSPECTED)  I-STAT CG4 LACTIC ACID, ED    EKG EKG Interpretation Date/Time:  Sunday June 27 2023 17:54:49 EDT Ventricular Rate:  127 PR Interval:  169 QRS Duration:  81 QT Interval:  300 QTC Calculation: 436 R Axis:   20  Text Interpretation: Sinus tachycardia Probable left atrial enlargement Probable left ventricular hypertrophy Confirmed by Alvester Chou [id number]) on 06/27/2023 6:39:36 PM  Radiology DG Chest Port 1 View Result Date: 06/27/2023 CLINICAL DATA:  Questionable sepsis - evaluate for abnormality EXAM: PORTABLE CHEST 1 VIEW COMPARISON:  February 23, 2023 FINDINGS: The cardiomediastinal silhouette is unchanged in contour. No pleural effusion. No pneumothorax. No acute pleuroparenchymal abnormality. IMPRESSION: No acute cardiopulmonary abnormality. Electronically Signed   By: Meda Klinefelter M.D.   On: 06/27/2023 18:36    Procedures Procedures    Medications Ordered in ED Medications  promethazine (PHENERGAN) 12.5 mg in sodium chloride 0.9 % 50 mL IVPB (has no administration in time range)  lactated ringers bolus 1,000 mL (1,000 mLs Intravenous New Bag/Given 06/27/23 1847)  ketorolac (TORADOL) 30 MG/ML injection 30 mg (30 mg Intravenous Given 06/27/23 1846)  ondansetron (ZOFRAN) injection 4 mg (4 mg Intravenous Given 06/27/23 1846)    ED Course/ Medical Decision Making/ A&P Clinical Course as of 06/27/23 2016  Sun Jun 27, 2023  1920 Lactic Acid, Venous: 0.6 [MT]  1920 WBC: 5.5 [MT]  1945 Influenza A By PCR(!): POSITIVE [MT]  2015 Patient reassessed and tolerating p.o., updated about influenza diagnosis.  She continues have some mild tachycardia which may be physiological and normal for her age, but no hypoxia, she is breathing comfortably, she is stable for discharge at this time. [MT]    Clinical Course User Index [MT] Roneka Gilpin, Kermit Balo, MD                                  Medical Decision Making Amount and/or Complexity of Data Reviewed Labs: ordered. Decision-making details documented in ED Course. Radiology: ordered.  Risk Prescription drug management.   This patient presents to the ED with concern for fever, chills, body aches, shortness of breath, cough. This involves an extensive number of treatment options, and is a complaint that carries with it a high risk of complications and morbidity.  The differential diagnosis includes viral upper respiratory infection versus bacterial infection or pneumonia versus alternative infection versus other  Does have a history of HIV reports regular compliance with her HIV medication, Biktarvy, and therefore is less likely to be immunocompromise.  I reviewed her external records and 3 weeks ago at the start of March she had HIV levels drawn which were undetectable.  I ordered and personally interpreted labs.  The pertinent results include: No leukocytosis, no emergent findings.  Lactate within normal limits.  INFLUENZA A POSITIVE.  I ordered imaging studies including x-ray of the chest I independently visualized and interpreted imaging which showed no emergent findings I agree with the radiologist interpretation  The patient was maintained on a cardiac monitor.  I personally viewed and interpreted the cardiac monitored which showed an underlying rhythm of: Sinus tachycardia  Per my interpretation the patient's ECG shows sinus tachycardia, no acute ischemic findings  I ordered medication including IV Zofran and Phenergan for nausea, LR bolus and Toradol for headache and body pain  I have reviewed the patients home medicines and have made adjustments as needed  Test Considered: Lower suspicion for acute meningitis, intracranial hemorrhage.  Low suspicion for acute PE or intra-abdominal infectious or surgical emergency.   After the interventions noted above, I reevaluated the patient and found that  they have: improved   Dispostion:  After consideration of the diagnostic results and the patients response to treatment, I feel that the patent would benefit from outpatient follow-up         Final Clinical Impression(s) / ED Diagnoses Final diagnoses:  Influenza A    Rx / DC Orders ED Discharge Orders          Ordered    ondansetron (ZOFRAN-ODT) 4 MG disintegrating tablet  Every 8 hours PRN        06/27/23 2014              Terald Sleeper, MD 06/27/23 2016

## 2023-09-07 NOTE — Unmapped (Signed)
 The [facility] has made a second and final attempt to reach this patient to refill the following medication:BIKTARVY 50-200-25 mg tablet (bictegrav-emtricit-tenofov ala).      We have left voicemails on the following phone numbers: [phone], have sent a MyChart message, and have sent a text message to the following phone numbers: [phone].    Dates contacted: 09/01/2023 and 09/07/2023  Last scheduled delivery: 06/17/2023    The patient may be at risk of non-compliance with this medication. The patient should call the [facility] at [phone]  Option 4, then Option 4: Infectious Disease, Transplant to refill medication.    Lanny Plan   [facility] Oncologist

## 2023-09-29 NOTE — Unmapped (Signed)
 Informed by [facility] that they are unable to reach Brandy Evans to schedule the delivery of her Biktarvy . I called Brandy Evans who informed me that she still has some left. Brandy Evans was not interested in getting transferred to the pharmacy. Informed [facility].     Duration of service: 5 minutes

## 2023-10-07 DIAGNOSIS — I10 Essential (primary) hypertension: Secondary | ICD-10-CM | POA: Diagnosis not present

## 2023-10-07 DIAGNOSIS — N3010 Interstitial cystitis (chronic) without hematuria: Secondary | ICD-10-CM | POA: Diagnosis not present

## 2023-10-07 DIAGNOSIS — R262 Difficulty in walking, not elsewhere classified: Secondary | ICD-10-CM | POA: Diagnosis not present

## 2023-10-07 DIAGNOSIS — R635 Abnormal weight gain: Secondary | ICD-10-CM | POA: Diagnosis not present

## 2023-10-08 MED ORDER — VALACYCLOVIR 1 GRAM TABLET
ORAL_TABLET | Freq: Two times a day (BID) | ORAL | 0 refills | 10.00000 days | Status: CP
Start: 2023-10-08 — End: 2023-10-18

## 2023-10-21 NOTE — Unmapped (Signed)
 The [facility] has made a third and final attempt to reach this patient to refill the following medication:Biktarvy .    We have left voicemails on the following phone numbers: [phone], have sent a MyChart message, and have sent a text message to the following phone numbers: [phone].    Dates contacted: 6/4, 6/10, 7/24  (additional attempt requested by clinic).    Last scheduled delivery: 06/17/23 ( 90 day supply)    The patient may be at risk of non-compliance with this medication. The patient should call the [facility] at [phone]  Option 4, then Option 4: Infectious Disease, Transplant to refill medication.    Aleck CHRISTELLA Gaskins, PharmD   [facility] Specialty Pharmacist

## 2023-12-02 ENCOUNTER — Ambulatory Visit

## 2023-12-02 ENCOUNTER — Ambulatory Visit
Admission: EM | Admit: 2023-12-02 | Discharge: 2023-12-02 | Disposition: A | Discharge status: Home / Self Care | Attending: Physician Assistant | Admitting: Physician Assistant

## 2023-12-02 DIAGNOSIS — R079 Chest pain, unspecified: Secondary | ICD-10-CM | POA: Diagnosis present

## 2023-12-02 DIAGNOSIS — R051 Acute cough: Secondary | ICD-10-CM | POA: Diagnosis not present

## 2023-12-02 DIAGNOSIS — R0602 Shortness of breath: Secondary | ICD-10-CM | POA: Diagnosis present

## 2023-12-02 DIAGNOSIS — Z22330 Carrier of Group B streptococcus: Secondary | ICD-10-CM | POA: Insufficient documentation

## 2023-12-02 LAB — SARS CORONAVIRUS 2 BY RT PCR: SARS Coronavirus 2 by RT PCR: NEGATIVE

## 2023-12-02 NOTE — ED Provider Notes (Signed)
 [facility]    CSN: [id number] Arrival date & time: 12/02/23  1838      History   Chief Complaint Chief Complaint  Patient presents with   Chest Pain   Shortness of Breath    HPI Tonya Weeks is a 33 y.o. female.   Patient here today for evaluation of shortness of breath and chest pain.  She states that she cannot catch her breath.  She states that it hurts to lay down.  Symptoms started last night.  This is similar to when she has had pneumonia in the past.  She has not any fever.  She does report that she is pregnant.  She is unsure how long she is in pregnancy.  The history is provided by the patient.  Chest Pain Associated symptoms: cough and shortness of breath   Associated symptoms: no abdominal pain, no fever, no nausea and no vomiting   Shortness of Breath Associated symptoms: chest pain and cough   Associated symptoms: no abdominal pain, no ear pain, no fever, no sore throat, no vomiting and no wheezing     Past Medical History:  Diagnosis Date   Anemia    chronic, s/p blood transfusion (Patient unsure when)   Headache    takes OTC   History of syphilis    Treated during last pregnancy    HIV infection (HCC)    Follows w/ [facility].   HSV infection    Hypertension    Follows w/ PCP, Dr. Arthur Reese.   Interstitial cystitis    Follows w/ [facility].   Pelvic floor dysfunction    Sickle cell trait (HCC)    Wears glasses     Patient Active Problem List   Diagnosis Date Noted   Carrier of group B Streptococcus 12/02/2023   Genital herpes simplex 02/04/2023   Chronic interstitial cystitis 02/04/2023   Increased body mass index 02/04/2023   Sore throat 05/23/2021   Pelvic pain 12/17/2020   IC (interstitial cystitis) 12/17/2020   High-tone pelvic floor dysfunction 12/17/2020   History of recurrent UTIs 12/17/2020   Abdominal pain 08/20/2020   Cystitis 08/20/2020   Pregnancy 07/09/2017   Encounter for elective induction  of labor 07/08/2017   Nausea & vomiting 07/05/2017   Pregnancy and infectious disease in third trimester 02/11/2017   Anemia of pregnancy 01/20/2017   Sickle cell trait (HCC) 01/20/2017   Syphilis 01/20/2017   Pregnant 01/20/2017   Hypertension in pregnancy 04/15/2015   Encounter for long-term (current) use of other medications 10/12/2013   Health care maintenance 10/12/2013   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   Depression 09/05/2012   Syphilis contact, treated 01/07/2012   HIV disease (HCC) 09/01/2011   HIV (human immunodeficiency virus infection) (HCC) 03/31/2011    Past Surgical History:  Procedure Laterality Date   COLPOSCOPY N/A 04/09/2023   Procedure: COLPOSCOPY;  Surgeon: Lilton Legions, DO;  Location: [facility];  Service: Gynecology;  Laterality: N/A;   DILATION AND CURETTAGE, DIAGNOSTIC / THERAPEUTIC      OB History     Gravida  4   Para  2   Term  2   Preterm      AB  1   Living  2      SAB      IAB  1   Ectopic      Multiple  0   Live Births  2  Home Medications    Prior to Admission medications   Medication Sig Start Date End Date Taking? Authorizing Provider  bictegravir-emtricitabine -tenofovir  AF (BIKTARVY) 50-200-25 MG TABS tablet Take 1 tablet by mouth daily. 04/23/21 05/26/24 Yes [provider]  hydrocortisone  2.5 % ointment Apply topically 2 (two) times daily. 05/26/21  Yes [provider]  hydrocortisone -pramoxine (PROCTOFOAM HC) rectal foam Place 1 applicator rectally 2 (two) times daily. 01/15/21  Yes [provider]  lidocaine  (XYLOCAINE ) 5 % ointment Apply 1 Application topically 4 (four) times daily as needed. 01/13/21  Yes [provider]  naloxone (NARCAN) nasal spray 4 mg/0.1 mL Place 1 spray into the nose once. 01/28/23  Yes [provider]  promethazine  (PHENERGAN ) 25 MG tablet Take 25 mg by mouth every 6 (six) hours as needed. 11/22/23   Yes [provider]  sulfamethoxazole-trimethoprim (BACTRIM DS) 800-160 MG tablet Take 1 tablet by mouth. 04/24/21  Yes [provider]  temazepam (RESTORIL) 7.5 MG capsule Take 7.5 mg by mouth daily at 2 PM. 11/05/20  Yes [provider]  valACYclovir  (VALTREX ) 500 MG tablet Take 500 mg by mouth daily. 02/04/23  Yes [provider]  VIENVA 0.1-20 MG-MCG tablet Take 1 tablet by mouth daily. 09/16/23  Yes [provider]  albuterol  (VENTOLIN  HFA) 108 (90 Base) MCG/ACT inhaler Inhale 1-2 puffs into the lungs every 6 (six) hours as needed for wheezing or shortness of breath. 02/23/23   Mayer, Jodi R, NP  amLODipine (NORVASC) 5 MG tablet Take 5 mg by mouth daily.    [provider]  APPLE CIDER VINEGAR PO Take by mouth. gummies    [provider]  azithromycin  (ZITHROMAX ) 250 MG tablet Take 250 mg by mouth daily.    [provider]  Buprenorphine (BUTRANS TD) Place onto the skin. Every 7 days    [provider]  DESCOVY  200-25 MG tablet Take 1 tablet by mouth at bedtime. 06/14/17   [provider]  dolutegravir  (TIVICAY ) 50 MG tablet Take 50 mg by mouth daily.    [provider]  ELMIRON 100 MG capsule Take 100 mg by mouth 3 (three) times daily. 12/17/20   [provider]  fluconazole  (DIFLUCAN ) 200 MG tablet Take 200 mg by mouth daily.    [provider]  gabapentin (NEURONTIN) 800 MG tablet Take 800 mg by mouth 3 (three) times daily.    [provider]  hydrOXYzine (ATARAX/VISTARIL) 25 MG tablet Take 1 tablet (25 mg total) by mouth nightly. 1-2 one hour before sleep 12/05/20   [provider]  lidocaine  (LMX) 4 % cream Apply 1 Application topically 3 (three) times daily as needed.    [provider]  Multiple Vitamin (MULTIVITAMIN WITH MINERALS) TABS tablet Take 1 tablet by mouth daily.    [provider]  naloxone [facility]) nasal spray 4 mg/0.1 mL Place 1  spray into the nose once.    [provider]  naproxen  sodium (ALEVE ) 220 MG tablet Take 440 mg by mouth daily as needed (pain).    [provider]  nystatin  (MYCOSTATIN ) 100000 UNIT/ML suspension Take 5 mLs by mouth 4 (four) times daily.    [provider]  ondansetron  (ZOFRAN -ODT) 4 MG disintegrating tablet Take 1 tablet (4 mg total) by mouth every 8 (eight) hours as needed for up to 15 doses for nausea or vomiting. 06/27/23   Cottie Donnice PARAS, MD  oxybutynin (DITROPAN-XL) 5 MG 24 hr tablet Take 5 mg by mouth at bedtime.  [provider]  oxyCODONE  (OXY IR/ROXICODONE ) 5 MG immediate release tablet Take 1 tablet by mouth every 4 (four) hours as needed.    [provider]  oxyCODONE -acetaminophen  (PERCOCET/ROXICET) 5-325 MG tablet Take 1-2 tablets by mouth as needed.    [provider]  phenazopyridine  (PYRIDIUM ) 200 MG tablet Take 1 tablet by mouth 3 (three) times daily.    [provider]  Tirzepatide-Weight Management (ZEPBOUND [place]) Inject into the skin.    [provider]  tiZANidine (ZANAFLEX) 4 MG tablet Take 4 mg by mouth 2 (two) times daily.    [provider]    Family History Family History  Problem Relation Age of Onset   Lupus Mother    Hypertension Mother    Heart failure Mother    Heart attack Mother    Sickle cell trait Mother    Scleroderma Mother    Hypertension Father    Arthritis Maternal Grandmother    Diabetes Maternal Grandmother    Hypertension Maternal Grandmother    Stroke Maternal Grandmother    Heart failure Maternal Grandmother    Heart attack Maternal Grandmother    Asthma Maternal Grandmother    Arthritis Maternal Grandfather    Diabetes Maternal Grandfather    Hypertension Maternal Grandfather    Stroke Maternal Grandfather    Heart failure Maternal Grandfather    Heart attack Maternal Grandfather    Kidney disease Maternal Grandfather    Other Neg Hx     Social  History Social History   Tobacco Use   Smoking status: Never   Smokeless tobacco: Never   Tobacco comments:    Smokes Hooka occasionally.  Vaping Use   Vaping status: Never Used  Substance Use Topics   Alcohol use: Not Currently   Drug use: Not Currently     Allergies   Patient has no known allergies.   Review of Systems Review of Systems  Constitutional:  Negative for chills and fever.  HENT:  Negative for congestion, ear pain, sinus pressure and sore throat.   Eyes:  Negative for discharge and redness.  Respiratory:  Positive for cough and shortness of breath. Negative for wheezing.   Cardiovascular:  Positive for chest pain.  Gastrointestinal:  Negative for abdominal pain, diarrhea, nausea and vomiting.     Physical Exam Triage Vital Signs ED Triage Vitals  Encounter Vitals Group     BP 12/02/23 1853 (!) 128/91     Girls Systolic BP Percentile --      Girls Diastolic BP Percentile --      Boys Systolic BP Percentile --      Boys Diastolic BP Percentile --      Pulse Rate 12/02/23 1853 94     Resp 12/02/23 1853 18     Temp 12/02/23 1853 98.4 F (36.9 C)     Temp Source 12/02/23 1853 Oral     SpO2 12/02/23 1853 98 %     Weight 12/02/23 1850 180 lb (81.6 kg)     Height 12/02/23 1850 5' 3 (1.6 m)     Head Circumference --      Peak Flow --      Pain Score 12/02/23 1847 9     Pain Loc --      Pain Education --      Exclude from Growth Chart --    No data found.  Updated Vital Signs BP (!) 128/91 (BP Location: Left Arm)   Pulse 94   Temp 98.4 F (  36.9 C) (Oral)   Resp 18   Ht 5' 3 (1.6 m)   Wt 180 lb (81.6 kg)   LMP 10/09/2023 (Exact Date) Comment: Confirmed by patient.  SpO2 98%   BMI 31.89 kg/m   Visual Acuity Right Eye Distance:   Left Eye Distance:   Bilateral Distance:    Right Eye Near:   Left Eye Near:    Bilateral Near:     Physical Exam Vitals and nursing note reviewed.  Constitutional:      General: She is not in acute  distress.    Appearance: Normal appearance. She is not ill-appearing.  HENT:     Head: Normocephalic and atraumatic.     Nose: No congestion or rhinorrhea.     Mouth/Throat:     Mouth: Mucous membranes are moist.     Pharynx: No oropharyngeal exudate or posterior oropharyngeal erythema.  Eyes:     Conjunctiva/sclera: Conjunctivae normal.  Cardiovascular:     Rate and Rhythm: Normal rate and regular rhythm.     Heart sounds: Normal heart sounds. No murmur heard. Pulmonary:     Effort: Pulmonary effort is normal. No respiratory distress.     Breath sounds: Normal breath sounds. No wheezing, rhonchi or rales.  Skin:    General: Skin is warm and dry.  Neurological:     Mental Status: She is alert.  Psychiatric:        Mood and Affect: Mood normal.        Thought Content: Thought content normal.      UC Treatments / Results  Labs (all labs ordered are listed, but only abnormal results are displayed) Labs Reviewed  SARS CORONAVIRUS 2 BY RT PCR    EKG   Radiology No results found.  Procedures Procedures (including critical care time)  Medications Ordered in UC Medications - No data to display  Initial Impression / Assessment and Plan / UC Course  I have reviewed the triage vital signs and the nursing notes.  Pertinent labs & imaging results that were available during my care of the patient were reviewed by me and considered in my medical decision making (see chart for details).    Unable to perform chest x-ray due to pregnancy.  EKG without acute findings.  Vitals normal.  Will order COVID screening and ultimately recommended further evaluation in the emergency room if symptoms worsen.  Patient expressed understanding.  Final Clinical Impressions(s) / UC Diagnoses   Final diagnoses:  Acute cough   Discharge Instructions   None    ED Prescriptions   None    PDMP not reviewed this encounter.   Billy Asberry FALCON, PA-C 12/02/23 2005

## 2023-12-02 NOTE — ED Triage Notes (Signed)
 Patient reports sob & chest pain, can't catch my breath, hurts to lay down, it feels like pna when I have had it before. No fever (known). Symptoms began late last night.

## 2023-12-09 NOTE — Unmapped (Signed)
 Infectious Diseases Clinic          HIV (dx'd 2013, nadir CD4 26/2% in 03/2021)  Medication history:  -03/21/2015 genotype no resistance (integrase resistance not included)  -01/29/2017 genotype - per provider notes E138A (R-rilpivirine)  -Previous ARVs: TDF/FTC + atazanavir/ritonavir (~2014)  -TAF/FTC + darunavir/cobicistat (Prescobix) (2017)  -TDF/FTC + dolutegravir  -TAF/FTC + dolutegravir per patient into late 2021 (Although at this time she was last seen in [facility] 06/11/2017)  -TAF/FTC+Bictegravir (04/23/2021)     -Multiple months off medications at different junctures, major barrier to care has been privacy (bad experiences w/ indiscrete healthcare systems previously) and not wanting to call to make appointments. Patient was reengaged in care via [facility] Counselor in early 2023.    06/04/2023  Overall things are going well. 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 Discussed secondary prophylaxis, prefers to continue episodic treatment.   To be given prescription for valacyclovir  1g BID for 7-10d during outbreaks     H/o Syphilis and serofast, 01/2012   02/09/2012 RPR 1:64 (CareEverywhere), s/p pen G x3 (11/26, 12/3, 03/15/2012)  - 2016 & 2018 RPR 1:4  - 03/2021, 02/2022, 01/22/23: RPR 1:4  - 06/04/2023: [name] showing RPR of 1:2 recently. We will not treat as she is serofast and her previous titers have been higher.   - 12/10/2023: Will repeat at her next follow-up visit.     H/o chlamydia, 01/2013  --> Tx with Azithromycin     Pelvic pain, 08/2020 --> extensive work-up done  Interstitial cystitis  - Since last visit in 02/2022:  Seen at [facility] for her pelvic pain --> diagnosed with interstitial cystitis. Patient has a history of recurrent UTIs, small capacity bladder (<200 mL) with severe ulcerations on cystoscopy, and chronic pelvic pain.   Her current medication regimen includes Butrans 10 mcg/h patch and Gabapentin  800 mg 3 times daily, Robaxin  (day), Tizanidine (night) and Lidocaine ointment as prescribed outside providers. Last 05/2022: Bladder instillation of Bupivacaine and 300mg  of Elmiron.   - 01/22/23: Patient explained that the plan is not to continue with Urology for bladder instillations any longer as her pain is well controlled.  - 12/10/2023: Pelvic pain still doing well.      Hypertension, chronically uncontrolled  - 01/22/23: in clinic 154/113; Diastolic pressure usually >100. Patient is asymptomatic but expressed concern that these elevated pressures could affect her in the future.   - 06/04/23: BP in clinic markedly elevated to 160/119 - currently asymptomatic and states that she took her blood pressure medications currently on Amlodipine 5mg  orally daily and has been advised to increase to 10mg  orally daily.   - 12/10/2023: BP 129/Currently asymptomatic for  end organ s/o damage        06/03/2023: Summary of diagnoses and orders for today's visit:   Diagnosis ICD-10-CM Associated Orders   1. HIV disease    (CMS-HCC)  B20 HIV RNA, Quantitative, PCR      2. Therapeutic drug monitoring  Z51.81 HIV RNA, Quantitative, PCR      3. On highly active antiretroviral therapy (HAART)  Z79.899 HIV RNA, Quantitative, PCR     ALT     AST     Bilirubin, total     Basic Metabolic Panel     CBC w/ Differential      4. At risk for adverse drug reaction  Z91.89 ALT AST     Bilirubin, total     Basic Metabolic Panel     CBC w/ Differential          Disposition  Return in about 6 months (around 06/08/2024) for In-person.    To do @ next RTC  Follow-up labs    I personally spent 30 minutes face-to-face and non-face-to-face in the care of this patient, which includes all pre, intra, and post visit time on the date of service.  All documented time was specific to the E/M visit and does not include any procedures that may have been performed.      Brandy Evans, MBBS                Chief Complaint   Establish care for HIV    HPI  In addition to details in A&P above  Brandy Evans is a 33 y.o. female  with HIV here to follow-up for routine care.   Diagnosed 2013 on routine pregnancy screening, previously followed by [facility]  History as stated above in A&P.      ROS: Denies cough/cold, fevers, chills.  Denies chest pain, palpitations, dyspnea  GU: Pelvic pain has improved on her current regimen  GI: Denies abdominal pain, nausea, vomiting, diarrhea.  Skin:  No new rashes.            BP 129/95 (BP Site: L Arm, BP Position: Sitting)  - Pulse 97  - Temp 36.9 ??C (98.5 ??F) (Oral)  - Ht 162.6 cm (5' 4)  - Wt 88.1 kg (194 lb 3.2 oz)  - LMP 10/09/2023 (Exact Date)  - BMI 33.33 kg/m??    Wt Readings from Last 3 Encounters:   12/10/23 88.1 kg (194 lb 3.2 oz)   06/04/23 90.2 kg (198 lb 12.8 oz)   01/22/23 93.9 kg (207 lb)       Physical Exam:    Patient is not interested in having an exam today. Would prefer for her gynecologist to examine her.      Health maintenance      Oral health   The patient does or does not have a dentist. Last dental exam beginning of this year    Eye health  The patient does or does not use corrective lenses. Last eye exam beginning of this year    Metabolic conditions  Wt Readings from Last 5 Encounters:   12/10/23 88.1 kg (194 lb 3.2 oz)   06/04/23 90.2 kg (198 lb 12.8 oz)   01/22/23 93.9 kg (207 lb)   03/16/22 81.3 kg (179 lb 4.8 oz)   08/27/21 77.6 kg (171 lb)     Lab Results   Component Value Date    CREATININE 0.76 06/04/2023    PROTEINUA 70 mg/dL (A) 93/71/7977  GLUCOSEU Trace (A) 09/24/2020    GLU 84 06/04/2023    A1C 5.7 (H) 03/16/2022    ALT 7 (L) 06/04/2023    ALT 22 01/22/2023    ALT <7 (L) 03/16/2022     # Kidney health - SCr and eGFR  # Bone health - assessment not yet needed (under age 35); SABRA   # Diabetes assessment - check A1c  # NAFLD assessment - suspicion for MAFLD low    Communicable diseases  Lab Results   Component Value Date    QFTTBGOLD Negative 03/16/2022    HEPAIGG Reactive (A) 03/16/2022    HEPBSAB Nonreactive 03/16/2022    HEPCAB Nonreactive 03/16/2022     # TB screening - no longer needed; negative IGRA, low risk  # Hepatitis screening - does not meet screening criteria; no further screening indicated  # MMR screening - assessment(s) needed but deferred to future visit    Lab Results   Component Value Date    RPR Reactive (A) 01/22/2023    RPR 1:4 01/22/2023    CTNAA Negative 04/23/2021    GCNAA Negative 04/23/2021    SPECSOURCE Urine 04/23/2021     GC/CT NAATs - patient declined screening  RPR - patient declined screening   Will get it at her next OB appointment.     Cancer screening  No results found for: PSASCRN, PSA, PAP, FINALDX  # Cervical - assessment(s) needed but deferred to future visit  Will be conducted at her gynecology visit.  # Breast - no indication for screening  # Liver - no screening indicated  # Lung - screening not indicated    Cardiovascular disease  Lab Results   Component Value Date    CHOL 118 03/16/2022    HDL 37 (L) 03/16/2022    LDL 45 03/16/2022    NONHDL 81 03/16/2022    TRIG 819 (H) 03/16/2022     # The ASCVD Risk score (Arnett DK, et al., 2019) failed to calculate.  - is not taking aspirin   - is not taking statin  - BP control poor  - never smoker  # AAA screening - no indication for screening    Immunization History   Administered Date(s) Administered    COVID-19 VAC,BIVALENT(63YR UP),PFIZER 06/11/2021    COVID-19 VACC,MRNA,(PFIZER)(PF) 07/07/2019, 08/01/2019    DTP 01/05/1991, 06/02/1994    HPV Quadrivalent (Gardasil) 04/15/2015    Haemophilis Influenza Type B Vaccine Hboc 09/16/1990    Hepatitis B Vaccine, Unspecified Formulation 08/03/2001, 09/14/2001, 06/26/2002    HiB, unspecified 08/05/1989, 10/07/1989, 12/03/1989, 01/05/1991    Human Papillomavirus Vaccine,9-Valent(PF) 03/16/2022    INFLUENZA TIV (TRI) 74MO+ W/ PRESERV (IM) 01/12/2012    INFLUENZA VACCINE IIV3(IM)(PF)6 MOS UP 01/22/2023    Influenza Vaccine Quad(IM)6 MO-Adult(PF) 02/13/2013, 02/11/2017    Influenza Virus Vaccine, unspecified formulation 11/28/2014    MMR 09/16/1990, 06/02/1994    OPV 06/02/1994    PNEUMOCOCCAL POLYSACCHARIDE 23-VALENT 09/01/2011    Pneumococcal Conjugate 20-valent 08/27/2021    Polio Virus Vaccine, Unspecified Formulation 08/12/1989, 10/07/1989, 12/03/1989, 11/15/1990    Td (adult) unspecified formulation 08/03/2001    TdaP 04/16/2017       Immunizations today - Not interested in the influenza shot nor COVID vaccine.    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      2 boys - healthy, both HIV negative     Housing - in apartment with family  School / Work & Benefits - Currently working at the hospital   Tobacco - never smoked tobacco (occasional Hookah, none since 08/2020)  Alcohol - never drinks alcohol  Substance use - none    Sexual health & secondary prevention   She has been sexually active since the last visit.   Female partners only.   Currently using condoms.   Partners are not being made aware of her HIV status.   Eventually she is interested in having more children.     Medications and Allergies  She has a current medication list which includes the following prescription(s): bictegrav-emtricit-tenofov ala, butrans, calcium carbonate, gabapentin , hydrocortisone, hydroxyzine, oxybutynin, pentosan polysulfate, tizanidine, and valacyclovir .    Allergies: Patient has no known allergies.    Family History  Her family history includes Arthritis in her maternal grandmother; Asthma in her maternal grandmother; Diabetes in her maternal grandmother; Heart attack in her mother; Hypertension in her father, maternal grandmother, and mother; Lupus in her mother; Scleroderma in her mother; Stroke in her maternal grandmother.

## 2023-12-10 ENCOUNTER — Encounter
Admit: 2023-12-10 | Discharge: 2023-12-10 | Payer: Medicaid (Managed Care) | Attending: Student in an Organized Health Care Education/Training Program | Primary: Student in an Organized Health Care Education/Training Program

## 2023-12-10 DIAGNOSIS — B20 Human immunodeficiency virus [HIV] disease: Secondary | ICD-10-CM | POA: Diagnosis not present

## 2023-12-10 DIAGNOSIS — Z9189 Other specified personal risk factors, not elsewhere classified: Secondary | ICD-10-CM | POA: Diagnosis not present

## 2023-12-10 DIAGNOSIS — Z79899 Other long term (current) drug therapy: Secondary | ICD-10-CM | POA: Diagnosis not present

## 2023-12-10 DIAGNOSIS — Z5181 Encounter for therapeutic drug level monitoring: Secondary | ICD-10-CM | POA: Diagnosis not present

## 2023-12-10 LAB — CBC W/ AUTO DIFF
BASOPHILS ABSOLUTE COUNT: 0 10*9/L (ref 0.0–0.1)
BASOPHILS RELATIVE PERCENT: 0.7 %
EOSINOPHILS ABSOLUTE COUNT: 0.1 10*9/L (ref 0.0–0.5)
EOSINOPHILS RELATIVE PERCENT: 0.9 %
HEMATOCRIT: 35 % (ref 34.0–44.0)
HEMOGLOBIN: 11.6 g/dL (ref 11.3–14.9)
LYMPHOCYTES ABSOLUTE COUNT: 1.3 10*9/L (ref 1.1–3.6)
LYMPHOCYTES RELATIVE PERCENT: 19.9 %
MEAN CORPUSCULAR HEMOGLOBIN CONC: 33.1 g/dL (ref 32.0–36.0)
MEAN CORPUSCULAR HEMOGLOBIN: 25.2 pg — ABNORMAL LOW (ref 25.9–32.4)
MEAN CORPUSCULAR VOLUME: 76 fL — ABNORMAL LOW (ref 77.6–95.7)
MEAN PLATELET VOLUME: 8.8 fL (ref 6.8–10.7)
MONOCYTES ABSOLUTE COUNT: 0.5 10*9/L (ref 0.3–0.8)
MONOCYTES RELATIVE PERCENT: 7.3 %
NEUTROPHILS ABSOLUTE COUNT: 4.7 10*9/L (ref 1.8–7.8)
NEUTROPHILS RELATIVE PERCENT: 71.2 %
PLATELET COUNT: 283 10*9/L (ref 150–450)
RED BLOOD CELL COUNT: 4.61 10*12/L (ref 3.95–5.13)
RED CELL DISTRIBUTION WIDTH: 17.8 % — ABNORMAL HIGH (ref 12.2–15.2)
WBC ADJUSTED: 6.5 10*9/L (ref 3.6–11.2)

## 2023-12-10 LAB — BASIC METABOLIC PANEL
ANION GAP: 13 mmol/L (ref 5–14)
BLOOD UREA NITROGEN: 6 mg/dL — ABNORMAL LOW (ref 9–23)
BUN / CREAT RATIO: 10
CALCIUM: 9.9 mg/dL (ref 8.7–10.4)
CHLORIDE: 101 mmol/L (ref 98–107)
CO2: 22.9 mmol/L (ref 20.0–31.0)
CREATININE: 0.6 mg/dL (ref 0.55–1.02)
EGFR CKD-EPI (2021) FEMALE: >90 mL/min/1.73m2 (ref >=60)
GLUCOSE RANDOM: 111 mg/dL (ref 70–179)
POTASSIUM: 3.6 mmol/L (ref 3.4–4.8)
SODIUM: 137 mmol/L (ref 135–145)

## 2023-12-10 LAB — BILIRUBIN, TOTAL: BILIRUBIN TOTAL: 0.2 mg/dL — ABNORMAL LOW (ref 0.3–1.2)

## 2023-12-10 LAB — ALT: ALT (SGPT): 12 U/L (ref 10–49)

## 2023-12-10 LAB — AST: AST (SGOT): 13 U/L (ref <=34)

## 2023-12-10 MED ORDER — VALACYCLOVIR 1 GRAM TABLET
ORAL_TABLET | Freq: Two times a day (BID) | ORAL | 0 refills | 30.00000 days | Status: CP
Start: 2023-12-10 — End: 2024-01-09
  Filled 2023-12-10: qty 60, 30d supply, fill #0

## 2023-12-10 NOTE — Unmapped (Signed)
 Teaching Physician Note     I saw and evaluated [name], participating in the key portions of the service.  I reviewed the fellow???s note.  I agree with the fellow???s findings and plan. I personally spent 40 minutes face-to-face and non-face-to-face in the care of this patient, which includes all pre, intra, and post visit time on the date of service.      Karon Valente Blush, MD   [facility] at [facility]  [address]   [place], [place] [address]  Phone: [phone]   Fax: [phone]

## 2023-12-10 NOTE — Unmapped (Signed)
 It was great to see you today. Thank you for entrusting me with your care!           The ID clinic phone number is [phone].  The ID clinic fax number is [phone].    Please note that your laboratory and other results may be visible to you in real time, possibly before they reach your provider. Please allow 48 hours for clinical interpretation of these results. Importantly, even if a result is flagged as abnormal, it may not be one that impacts your health.    For urgent issues on nights and weekends you may reach the ID Physician on call through the [facility] Operator at [phone].     URGENT CARE  Please call ahead to speak with the nursing staff if you are in need of an urgent appointment.       MEDICATIONS  For refills please contact your pharmacy and ask them to electronically send or fax the request to the clinic.   Please bring all medications in original bottles to every appointment.    [facility] (formerly [facility]) or [employer] Eligibility (required even if you do not receive medication through [facility])  Please remember to renew your Joycelynn Fritsche eligibility during renewal periods which occur twice a year: January-March and July-September.     The following are needed for each renewal:   - [place]  Identification (if you don't have one, then a bill with your name and address in [place] )   - proof of income (award letter, W-2, or last three check stubs)   If you are unable to come in for renewal, let us  know if we can mail, fax or e-mail paperwork to you.   [facility] Contact: [phone].     Lab info:  Your most recent CD4 T-cell counts and viral loads are below. Here are a few things to keep in mind when looking at your numbers:  Our goal is to get your virus to be undetectable and keep it undetectable. If the virus is undetectable you are much more likely to stay healthy.  We consider your viral load to be undetectable if it says <40 or if it says Not detected.  For most people, we're checking CD4 counts every other visit (once or twice a year, or sometimes even less).  It's normal for your CD4 count to be different from visit to visit.   You can help by taking your medications at about the same time, every single day. If you're having trouble with taking your medications, it's important to let us  know.    Lab Results   Component Value Date    ACD4 527 01/22/2023    CD4 31 (L) 01/22/2023    HIVCP <20 (H) 06/11/2021    HIVRS Not Detected 06/04/2023       Brandy Evans, MBBS    [facility] Phone: [phone]

## 2023-12-13 LAB — HIV RNA, QUANTITATIVE, PCR: HIV RNA QNT RSLT: NOT DETECTED

## 2023-12-15 DIAGNOSIS — O26841 Uterine size-date discrepancy, first trimester: Secondary | ICD-10-CM | POA: Diagnosis not present

## 2023-12-15 DIAGNOSIS — Z3480 Encounter for supervision of other normal pregnancy, unspecified trimester: Secondary | ICD-10-CM | POA: Diagnosis not present

## 2023-12-15 DIAGNOSIS — Z3A09 9 weeks gestation of pregnancy: Secondary | ICD-10-CM | POA: Diagnosis not present

## 2024-03-02 NOTE — Progress Notes (Addendum)
 Upon investigation, one additional attempt for patient contact is warranted.  The [facility] reached out to this patient on 03/02/2024.     Brandy Evans has been contacted in regards to their refill of Biktarvy . At this time, they have declined refill due to patient having 30 doses remaining. Refill assessment call date has been updated per the patient's request.      Last scheduled delivery: 06/17/23 (90 day supply)    Aleck CHRISTELLA Gaskins, PharmD  [facility] Specialty Pharmacist

## 2024-03-21 MED ORDER — VALACYCLOVIR 1 GRAM TABLET
ORAL_TABLET | Freq: Two times a day (BID) | ORAL | 0 refills | 30.00000 days
Start: 2024-03-21 — End: 2024-04-20

## 2024-03-21 NOTE — Progress Notes (Signed)
 Brandy Evans has been contacted in regards to their refill of Biktarvy . At this time, they have declined refill due to wanting to pick up refill sooner at [facility]. Refill assessment call date has been updated per the patient's request.

## 2024-03-22 DIAGNOSIS — B20 Human immunodeficiency virus [HIV] disease: Principal | ICD-10-CM

## 2024-03-22 DIAGNOSIS — B009 Herpesviral infection, unspecified: Principal | ICD-10-CM

## 2024-03-22 MED ORDER — BICTEGRAVIR 50 MG-EMTRICITABINE 200 MG-TENOFOVIR ALAFENAM 25 MG TABLET
ORAL_TABLET | Freq: Every day | ORAL | 1 refills | 90.00000 days | Status: CP
Start: 2024-03-22 — End: 2025-03-22
  Filled 2024-04-04: qty 90, 90d supply, fill #0

## 2024-03-22 MED ORDER — VALACYCLOVIR 1 GRAM TABLET
ORAL_TABLET | Freq: Two times a day (BID) | ORAL | 2 refills | 30.00000 days | Status: CP
Start: 2024-03-22 — End: ?
  Filled 2024-04-04: qty 60, 30d supply, fill #0

## 2024-03-22 NOTE — Telephone Encounter (Signed)
 Medication Requested: Biktarvy       Future Appointments   Date Time Provider Department Center   06/09/2024  4:00 PM Cleotilde Eliverto LABOR, MBBS [facility]     Per Provider Note: Overall things are going well. Current regimen: Biktarvy  (BIC/FTC/TAF)  Misses doses of ARVs never    Standing order protocol requirements met?: Yes    Sent to: Pharmacy per protocol    Days Supply Given: 90 days  Number of Refills: 1

## 2024-03-22 NOTE — Telephone Encounter (Signed)
 Medication Requested: valACYclovir  (VALTREX ) 1000 MG tablet       Future Appointments   Date Time Provider Department Center   06/09/2024  4:00 PM Cleotilde Eliverto LABOR, MBBS [facility]     Per Provider Note: in med list    Standing order protocol requirements met?: Yes    Sent to: Pharmacy per protocol    Days Supply Given: 30 days  Number of Refills: 2

## 2024-04-20 NOTE — Progress Notes (Signed)
 Did not renew    Removed patient flag as patient did not renew.  Updated CW.        Brandy Evans  Benefits Counselor  Time of Intervention: 5 mins
